# Patient Record
Sex: Female | Born: 1965 | Race: Black or African American | Hispanic: No | Marital: Single | Smoking: Never smoker
Health system: Southern US, Community
[De-identification: ages and names within clinical notes are randomized; demographics above are authoritative.]

## PROBLEM LIST (undated history)

## (undated) DIAGNOSIS — R251 Tremor, unspecified: Secondary | ICD-10-CM

## (undated) DIAGNOSIS — I10 Essential (primary) hypertension: Secondary | ICD-10-CM

## (undated) DIAGNOSIS — N200 Calculus of kidney: Secondary | ICD-10-CM

## (undated) DIAGNOSIS — Z1239 Encounter for other screening for malignant neoplasm of breast: Secondary | ICD-10-CM

## (undated) DIAGNOSIS — Z1211 Encounter for screening for malignant neoplasm of colon: Secondary | ICD-10-CM

## (undated) DIAGNOSIS — G20A1 Parkinson's disease without dyskinesia, without mention of fluctuations: Secondary | ICD-10-CM

## (undated) DIAGNOSIS — G20 Parkinson's disease: Secondary | ICD-10-CM

## (undated) HISTORY — DX: Parkinson's disease: G20

## (undated) HISTORY — DX: Encounter for screening for malignant neoplasm of colon: Z12.11

## (undated) HISTORY — DX: Parkinson's disease without dyskinesia, without mention of fluctuations: G20.A1

## (undated) HISTORY — PX: TONSILLECTOMY: SUR1361

## (undated) HISTORY — DX: Essential (primary) hypertension: I10

## (undated) HISTORY — DX: Calculus of kidney: N20.0

## (undated) HISTORY — DX: Encounter for other screening for malignant neoplasm of breast: Z12.39

## (undated) HISTORY — DX: Encounter for screening for malignant neoplasm of rectum: Z12.11

---

## 2018-02-01 ENCOUNTER — Encounter (HOSPITAL_COMMUNITY): Payer: Self-pay

## 2018-02-01 ENCOUNTER — Emergency Department (HOSPITAL_COMMUNITY): Payer: Self-pay

## 2018-02-01 ENCOUNTER — Emergency Department (HOSPITAL_COMMUNITY)
Admission: EM | Admit: 2018-02-01 | Discharge: 2018-02-02 | Disposition: A | Discharge status: Home / Self Care | Payer: Self-pay | Attending: Emergency Medicine | Admitting: Emergency Medicine

## 2018-02-01 DIAGNOSIS — N200 Calculus of kidney: Secondary | ICD-10-CM | POA: Insufficient documentation

## 2018-02-01 DIAGNOSIS — H6121 Impacted cerumen, right ear: Secondary | ICD-10-CM | POA: Insufficient documentation

## 2018-02-01 DIAGNOSIS — Z7982 Long term (current) use of aspirin: Secondary | ICD-10-CM | POA: Insufficient documentation

## 2018-02-01 DIAGNOSIS — F17210 Nicotine dependence, cigarettes, uncomplicated: Secondary | ICD-10-CM | POA: Insufficient documentation

## 2018-02-01 HISTORY — DX: Tremor, unspecified: R25.1

## 2018-02-01 LAB — URINALYSIS, ROUTINE W REFLEX MICROSCOPIC
Bilirubin Urine: NEGATIVE
Glucose, UA: NEGATIVE mg/dL
Ketones, ur: NEGATIVE mg/dL
Leukocytes, UA: NEGATIVE
Nitrite: NEGATIVE
Protein, ur: NEGATIVE mg/dL
Specific Gravity, Urine: >1.03 — ABNORMAL HIGH (ref 1.005–1.030)
pH: 5.5 (ref 5.0–8.0)

## 2018-02-01 LAB — URINALYSIS, MICROSCOPIC (REFLEX)

## 2018-02-01 LAB — CBC WITH DIFFERENTIAL/PLATELET
Basophils Absolute: 0 10*3/uL (ref 0.0–0.1)
Basophils Relative: 0 %
Eosinophils Absolute: 0.1 10*3/uL (ref 0.0–0.7)
Eosinophils Relative: 1 %
HCT: 48.1 % — ABNORMAL HIGH (ref 36.0–46.0)
Hemoglobin: 16 g/dL — ABNORMAL HIGH (ref 12.0–15.0)
Lymphocytes Relative: 25 %
Lymphs Abs: 3.6 10*3/uL (ref 0.7–4.0)
MCH: 28.4 pg (ref 26.0–34.0)
MCHC: 33.3 g/dL (ref 30.0–36.0)
MCV: 85.4 fL (ref 78.0–100.0)
Monocytes Absolute: 0.7 10*3/uL (ref 0.1–1.0)
Monocytes Relative: 5 %
Neutro Abs: 9.9 10*3/uL — ABNORMAL HIGH (ref 1.7–7.7)
Neutrophils Relative %: 69 %
Platelets: 266 10*3/uL (ref 150–400)
RBC: 5.63 MIL/uL — ABNORMAL HIGH (ref 3.87–5.11)
RDW: 13.4 % (ref 11.5–15.5)
WBC: 14.2 10*3/uL — ABNORMAL HIGH (ref 4.0–10.5)

## 2018-02-01 LAB — BASIC METABOLIC PANEL
Anion gap: 11 (ref 5–15)
BUN: 10 mg/dL (ref 6–20)
CO2: 25 mmol/L (ref 22–32)
Calcium: 9.6 mg/dL (ref 8.9–10.3)
Chloride: 102 mmol/L (ref 101–111)
Creatinine, Ser: 0.88 mg/dL (ref 0.44–1.00)
GFR calc Af Amer: >60 mL/min (ref >60)
GFR calc non Af Amer: >60 mL/min (ref >60)
Glucose, Bld: 146 mg/dL — ABNORMAL HIGH (ref 65–99)
Potassium: 3.8 mmol/L (ref 3.5–5.1)
Sodium: 138 mmol/L (ref 135–145)

## 2018-02-01 MED ORDER — ONDANSETRON 4 MG PO TBDP
4.0000 mg | ORAL_TABLET | Freq: Once | ORAL | Status: AC
  Administered 2018-02-01: 4 mg via ORAL
  Filled 2018-02-01: qty 1

## 2018-02-01 NOTE — ED Provider Notes (Signed)
Patient placed in Quick Look pathway, seen and evaluated   Chief Complaint: urinary retention, back pain  HPI:   Patient awoke this morning with low back pain favoring the right side and trouble urinating.  Patient does not have any history of chronic back issues.  She feels weak in her lower extremity's but is able to walk.  No distal numbness or tingling. Patient denies warning symptoms of back pain including: fecal incontinence, night sweats, waking from sleep with back pain, unexplained fevers or weight loss, h/o cancer, IVDU, recent trauma.     ROS:  Positive ROS: (+) Back pain, urinary retention, vomiting Negative ROS: (-) Fever, chest pain  Physical Exam:   Gen: Uncomfortable appearing  Neuro: Awake and Alert  Skin: Diaphoretic    Focused Exam: Heart RRR, nml S1,S2, no m/r/g; Lungs CTAB; Abd soft, mild suprapubic discomfort, no rebound or guarding; Ext 2+ pedal pulses bilaterally, no edema.  BP (!) 149/125 (BP Location: Left Arm)   Pulse 83   Temp 98.5 F (36.9 C) (Oral)   Resp 20   SpO2 96%   Plan: Patient with some element of urinary retention, will perform bladder scan and cath if necessary.  Given patient's amount of discomfort with right lateral back pain, will perform a renal protocol CT scan to evaluate for stone.  Initiation of care has begun. The patient has been counseled on the process, plan, and necessity for staying for the completion/evaluation, and the remainder of the medical screening examination    Renne Crigler, Cordelia Poche 02/01/18 Luberta Mutter, MD 02/02/18 [phone]

## 2018-02-01 NOTE — ED Triage Notes (Signed)
Pt presents from home with reports of not voiding x 3 hours, R flank pain and nausea/vomiting on arrival.  206 palpated BP, HR: 96, 100%-RA, CBG: 117.  Pt also reports R ear pain - has PCP appointment on Thursday.

## 2018-02-02 MED ORDER — IBUPROFEN 600 MG PO TABS: 600.0000 mg | ORAL_TABLET | Freq: Four times a day (QID) | ORAL | 0 refills | Status: DC | PRN

## 2018-02-02 MED ORDER — KETOROLAC TROMETHAMINE 30 MG/ML IJ SOLN
30.0000 mg | Freq: Once | INTRAMUSCULAR | Status: AC
  Administered 2018-02-02: 30 mg via INTRAMUSCULAR
  Filled 2018-02-02: qty 1

## 2018-02-02 MED ORDER — DOCUSATE SODIUM 50 MG/5ML PO LIQD
50.0000 mg | Freq: Once | ORAL | Status: AC
  Administered 2018-02-02: 50 mg via OTIC
  Filled 2018-02-02: qty 10

## 2018-02-02 NOTE — ED Provider Notes (Signed)
[facility] EMERGENCY DEPARTMENT Provider Note   CSN: [id number] Arrival date & time: 02/01/18  1738     History   Chief Complaint No chief complaint on file.   HPI Teresa Logan is a 52 y.o. female.  HPI  This is a 52 year old female with a history of essential tremor who presents with urinary frequency, urgency, and right low back pain.  Patient reports onset of symptoms this morning.  She reports that she has had urgency to urinate but frequently only dribbles.  She has noted dark urine and occasionally blood in her urine.  She has never had anything like this before.  She denies any abdominal pain.  She did have one episode of nonbilious, nonbloody emesis.  She reports right lower back aching.  Overall on my evaluation, the patient symptoms have resolved.  She does report a little bit of urinary pressure.  She denies any fevers.  She did not take anything for her symptoms at home.  No history of kidney stones.  Of note, patient also reports decreased hearing out of the right ear.  She states "I think something is in there."  She denies any ear pain.  Past Medical History:  Diagnosis Date  . Tremors of nervous system     There are no active problems to display for this patient.   Past Surgical History:  Procedure Laterality Date  . TONSILLECTOMY       OB History   None      Home Medications    Prior to Admission medications   Medication Sig Start Date End Date Taking? Authorizing Provider  aspirin EC 81 MG tablet Take 162 mg by mouth daily.   Yes [provider]  ibuprofen (ADVIL,MOTRIN) 600 MG tablet Take 1 tablet (600 mg total) by mouth every 6 (six) hours as needed. 02/02/18   Mysha Peeler, Mayer Masker, MD    Family History History reviewed. No pertinent family history.  Social History Social History   Tobacco Use  . Smoking status: Current Some Day Smoker  . Smokeless tobacco: Never Used  Substance Use Topics  . Alcohol use: Not on  file  . Drug use: Not on file     Allergies   Patient has no known allergies.   Review of Systems Review of Systems  Constitutional: Negative for fever.  Respiratory: Negative for shortness of breath.   Cardiovascular: Negative for chest pain.  Gastrointestinal: Positive for nausea and vomiting. Negative for abdominal pain.  Genitourinary: Positive for frequency, hematuria and urgency. Negative for dysuria.  Musculoskeletal: Positive for back pain.  All other systems reviewed and are negative.    Physical Exam Updated Vital Signs BP (!) 154/99   Pulse 88   Temp 98.4 F (36.9 C) (Oral)   Resp 16   Ht  (1.676 m)   Wt 99.8 kg (220 lb)   SpO2 100%   BMI 35.51 kg/m   Physical Exam  Constitutional: She is oriented to person, place, and time. She appears well-developed and well-nourished.  Obese, no acute distress  HENT:  Head: Normocephalic and atraumatic.  Cerumen impaction right ear, completely obscuring right TM TM clear after removal, no perforation  Eyes: Pupils are equal, round, and reactive to light.  Cardiovascular: Normal rate, regular rhythm and normal heart sounds.  Pulmonary/Chest: Effort normal and breath sounds normal. No respiratory distress. She has no wheezes.  Abdominal: Soft. Bowel sounds are normal. There is no tenderness. There is no rebound and no  guarding.  No CVA tenderness  Neurological: She is alert and oriented to person, place, and time.  Skin: Skin is warm and dry.  Psychiatric: She has a normal mood and affect.  Nursing note and vitals reviewed.    ED Treatments / Results  Labs (all labs ordered are listed, but only abnormal results are displayed) Labs Reviewed  CBC WITH DIFFERENTIAL/PLATELET - Abnormal; Notable for the following components:      Result Value   WBC 14.2 (*)    RBC 5.63 (*)    Hemoglobin 16.0 (*)    HCT 48.1 (*)    Neutro Abs 9.9 (*)    All other components within normal limits  BASIC METABOLIC PANEL -  Abnormal; Notable for the following components:   Glucose, Bld 146 (*)    All other components within normal limits  URINALYSIS, ROUTINE W REFLEX MICROSCOPIC - Abnormal; Notable for the following components:   Specific Gravity, Urine >1.030 (*)    Hgb urine dipstick LARGE (*)    All other components within normal limits  URINALYSIS, MICROSCOPIC (REFLEX) - Abnormal; Notable for the following components:   Bacteria, UA RARE (*)    All other components within normal limits    EKG None  Radiology Ct Renal Stone Study  Result Date: 02/01/2018 CLINICAL DATA:  Right flank pain.  Nausea and vomiting. EXAM: CT ABDOMEN AND PELVIS WITHOUT CONTRAST TECHNIQUE: Multidetector CT imaging of the abdomen and pelvis was performed following the standard protocol without IV contrast. COMPARISON:  None. FINDINGS: Lower chest: No acute abnormality. Hepatobiliary: No focal liver abnormality is seen. No gallstones, gallbladder wall thickening, or biliary dilatation. Pancreas: Unremarkable. No pancreatic ductal dilatation or surrounding inflammatory changes. Spleen: Normal in size without focal abnormality. Adrenals/Urinary Tract: There is a punctate stone in the lower right kidney. There is a 3 or 4 mm stone in the lower left kidney. No hydronephrosis or perinephric stranding. There is a tiny stone in the posterior bladder near midline. The right ureter is mildly more prominent than the left. The left ureter is normal in caliber with no stones. The bladder is otherwise normal. Stomach/Bowel: Stomach and small bowel are normal. The appendix is mildly prominent distally without adjacent stranding. The appendix measures up to 8.6 mm. The colon is unremarkable. Vascular/Lymphatic: The abdominal aorta is normal in caliber. A few shotty nodes in the right lower quadrant mesentery may be reactive. No adenopathy. Reproductive: Uterus and bilateral adnexa are unremarkable. Other: No abdominal wall hernia or abnormality. No  abdominopelvic ascites. Musculoskeletal: No acute or significant osseous findings. IMPRESSION: 1. There is a tiny stone in the posterior bladder. The right ureter is slightly larger than the left. Given symptoms, I suspect a recently passed stone explaining the patient's symptoms. 2. Distal appendix is prominent in size measuring up to 8.6 mm. However, there is no adjacent stranding. Given the apparent recently passed stone to explain the patient's symptoms and the lack of para appendiceal stranding, suspicion for appendicitis is low. Recommend clinical correlation. 3. Punctate stone in the lower right kidney. Three or 4 mm stone in the lower left kidney. Electronically Signed   By: Gerome Sam III M.D   On: 02/01/2018 19:52    Procedures .Ear Cerumen Removal Date/Time: 02/02/2018 3:42 AM Performed by: Shon Baton, MD Authorized by: Shon Baton, MD   Consent:    Consent obtained:  Verbal   Consent given by:  Patient   Risks discussed:  TM perforation and pain  Alternatives discussed:  No treatment Procedure details:    Location:  R ear   Procedure type: irrigation   Post-procedure details:    Inspection:  TM intact   Hearing quality:  Improved   Patient tolerance of procedure:  Tolerated well, no immediate complications   (including critical care time)  Medications Ordered in ED Medications  ondansetron (ZOFRAN-ODT) disintegrating tablet 4 mg (4 mg Oral Given 02/01/18 1823)  ketorolac (TORADOL) 30 MG/ML injection 30 mg (30 mg Intramuscular Given 02/02/18 0243)  docusate (COLACE) 50 MG/5ML liquid 50 mg (50 mg Right EAR Given 02/02/18 0258)     Initial Impression / Assessment and Plan / ED Course  I have reviewed the triage vital signs and the nursing notes.  Pertinent labs & imaging results that were available during my care of the patient were reviewed by me and considered in my medical decision making (see chart for details).     Patient presents with urinary  symptoms and low back pain.  Also reports decreased hearing in the right ear.  She is overall nontoxic-appearing.  Symptoms have mostly improved.  She was evaluated by the provider in triage.  Work-up reviewed.  CT scan shows likely recently passed stone into the bladder.  Of note, she also has enlarged appendix.  There is no adjacent stranding.  Clinically I have low suspicion for appendicitis as patient is nontender on exam and symptoms have resolved.  Much more consistent with kidney stones.  Cerumen impaction was removed and TM is clear.  Urology follow-up provided.  Given that symptoms have resolved, will treat with ibuprofen.  After history, exam, and medical workup I feel the patient has been appropriately medically screened and is safe for discharge home. Pertinent diagnoses were discussed with the patient. Patient was given return precautions.   Final Clinical Impressions(s) / ED Diagnoses   Final diagnoses:  Kidney stone  Impacted cerumen of right ear    ED Discharge Orders        Ordered    ibuprofen (ADVIL,MOTRIN) 600 MG tablet  Every 6 hours PRN     02/02/18 0337       Shon Baton, MD 02/02/18 [phone]

## 2018-02-02 NOTE — Discharge Instructions (Addendum)
You were seen today for urinary symptoms and back pain.  It appears that you recently casted kidney stone and your bladder.  You should feel progressive improvement.  Take ibuprofen as needed for pain.  You also had impaction of earwax in the right ear.  Do not use Q-tips or anything else to clean out your ears.

## 2018-02-02 NOTE — ED Notes (Signed)
ED Provider at bedside. 

## 2019-12-08 ENCOUNTER — Other Ambulatory Visit: Payer: Self-pay

## 2019-12-08 ENCOUNTER — Ambulatory Visit: Payer: Medicaid Other | Admitting: Internal Medicine

## 2019-12-08 ENCOUNTER — Encounter: Payer: Self-pay | Admitting: Internal Medicine

## 2019-12-08 VITALS — BP 173/117 | HR 99 | Temp 99.0°F | Ht 66.0 in | Wt 218.6 lb

## 2019-12-08 DIAGNOSIS — Z79899 Other long term (current) drug therapy: Secondary | ICD-10-CM

## 2019-12-08 DIAGNOSIS — Z1211 Encounter for screening for malignant neoplasm of colon: Secondary | ICD-10-CM

## 2019-12-08 DIAGNOSIS — G20 Parkinson's disease: Secondary | ICD-10-CM | POA: Diagnosis not present

## 2019-12-08 DIAGNOSIS — G20A1 Parkinson's disease without dyskinesia, without mention of fluctuations: Secondary | ICD-10-CM | POA: Insufficient documentation

## 2019-12-08 DIAGNOSIS — Z1212 Encounter for screening for malignant neoplasm of rectum: Secondary | ICD-10-CM

## 2019-12-08 DIAGNOSIS — Z13220 Encounter for screening for lipoid disorders: Secondary | ICD-10-CM

## 2019-12-08 DIAGNOSIS — Z7722 Contact with and (suspected) exposure to environmental tobacco smoke (acute) (chronic): Secondary | ICD-10-CM

## 2019-12-08 DIAGNOSIS — Z1239 Encounter for other screening for malignant neoplasm of breast: Secondary | ICD-10-CM | POA: Insufficient documentation

## 2019-12-08 DIAGNOSIS — I10 Essential (primary) hypertension: Secondary | ICD-10-CM

## 2019-12-08 DIAGNOSIS — Z1231 Encounter for screening mammogram for malignant neoplasm of breast: Secondary | ICD-10-CM

## 2019-12-08 DIAGNOSIS — Z131 Encounter for screening for diabetes mellitus: Secondary | ICD-10-CM | POA: Diagnosis not present

## 2019-12-08 LAB — POCT GLYCOSYLATED HEMOGLOBIN (HGB A1C): Hemoglobin A1C: 5.6 % (ref 4.0–5.6)

## 2019-12-08 LAB — GLUCOSE, CAPILLARY: Glucose-Capillary: 119 mg/dL — ABNORMAL HIGH (ref 70–99)

## 2019-12-08 MED ORDER — HYDROCHLOROTHIAZIDE 12.5 MG PO CAPS: 12.5000 mg | ORAL_CAPSULE | Freq: Every day | ORAL | 1 refills | Status: DC

## 2019-12-08 NOTE — Assessment & Plan Note (Signed)
Breast cancer screening Patient reports last mammogram in 2011.  No family history of breast cancer or abnormal mammograms. Plan: Referral sent for mammogram

## 2019-12-08 NOTE — Assessment & Plan Note (Signed)
Colorectal Screening: Patient would not like to have a colonoscopy.   Plan: fecal immunochemical test

## 2019-12-08 NOTE — Assessment & Plan Note (Signed)
Parkinson's Disease: Patient reports she was followed by Dr. Baird Lyons in [facility].  She had a DaTscan in 2013, but did not follow-up with the neurologist. Approximately 5 years later she obtained her records and the report read diagnostic for Parkinson Disease. She made an appointment with Dr.Landress who confirmed this diagnosis. She has not started any treatment.  Her tremors have been in her right arm and right leg mostly.  She has noticed depression and decline in energy over time.  Denies any difficulty swallowing, constipation, and problems with urination. Sleep is effected with tremor, but patient reports she is managing.  Plan: -Referral to neurology - obtain records , release form signed  Dr.Landress Address: [phone] [place], [place] [address] Hours:  Open ? Closes 5PM Phone: [phone]

## 2019-12-08 NOTE — Progress Notes (Signed)
   CC: Here to establish care, need a referral to neurologist for Parkinson's disease, and evaluation of high blood pressure  HPI:Ms.Lema Heinkel is a 54 y.o. female who presents for evaluation of high blood pressure and to establish care. Please see individual problem based A/P for details.    Past Medical History:  Diagnosis Date  . Tremors of nervous system    Past Surgical History:  Procedure Laterality Date  . TONSILLECTOMY     Family History  Problem Relation Age of Onset  . Diabetes type II Mother   . Alzheimer's disease Mother   . Diabetes type II Father   . Kidney disease Father    Social History   Tobacco Use  . Smoking status: Passive Smoke Exposure - Never Smoker  . Smokeless tobacco: Never Used  Substance Use Topics  . Alcohol use: Yes    Alcohol/week: 2.0 standard drinks    Types: 1 Glasses of wine, 1 Cans of beer per week    Comment: 1 drink per day, either wine or beer  . Drug use: Not on file    Review of Systems:  ROS negative except as per HPI.  Physical Exam: Vitals:   12/08/19 0926  SpO2: 99%  Weight: 111 lb 11.2 oz (50.7 kg)    General: Alert, tremor, stooped posture HE: Normocephalic, atraumatic , EOMI, Conjunctivae normal ENT: No congestion, no rhinorrhea moist, no exudate or erythema  Cardiovascular: Normal rate, regular rhythm.  No murmurs, rubs, or gallops Pulmonary : Effort normal, breath sounds normal. No wheezes, rales, or rhonchi Abdominal: soft, nontender,  bowel sounds present Musculoskeletal: no swelling , or deformity  Neuro: Resting tremor in right arm and right leg. Tremor is better with movement.  Skin: Warm, dry  Psychiatric/Behavioral:  normal mood, normal behavior     Assessment & Plan:   See Encounters Tab for problem based charting.  Patient discussed with Dr. Heide Spark

## 2019-12-08 NOTE — Assessment & Plan Note (Signed)
Hypertension: Patient's BP today is 173/117 with a goal of <140/80. The patient is currently untreated.  Reports her blood pressure usually runs 145/85-90 at home.  She is a former Engineer, civil (consulting). Denies ever being on treatment,but reports her blood pressure has been slightly elevated in the past.  Plan: -Start HCTZ  12.5 mg daily, follow up in 4 weeks - BMP

## 2019-12-08 NOTE — Patient Instructions (Addendum)
Thank you for trusting me with your care. To recap, today we discussed the following:   Parkinson's disease I put a referral into neurology.  We will obtain records from your previous neurologist.  Hypertension Your blood pressure was 173/117 today.  We will start a medication called hydrochlorothiazide.  I would like you to follow-up in 4 weeks.  Lab work: We will check a lipid panel, hemoglobin A1c, complete blood count, and a basic metabolic panel today .  I have put a referral in for your mammogram. Also you will be given instructions for fecal occult blood test by the lab.  This is a screening for colorectal cancer.  We can schedule a Pap smear at your next visit.  My best,  Thurmon Fair, MD

## 2019-12-09 LAB — CBC
Hematocrit: 50.4 % — ABNORMAL HIGH (ref 34.0–46.6)
Hemoglobin: 16.8 g/dL — ABNORMAL HIGH (ref 11.1–15.9)
MCH: 28.8 pg (ref 26.6–33.0)
MCHC: 33.3 g/dL (ref 31.5–35.7)
MCV: 86 fL (ref 79–97)
Platelets: 328 10*3/uL (ref 150–450)
RBC: 5.84 x10E6/uL — ABNORMAL HIGH (ref 3.77–5.28)
RDW: 13 % (ref 11.7–15.4)
WBC: 9.7 10*3/uL (ref 3.4–10.8)

## 2019-12-09 LAB — BMP8+ANION GAP
Anion Gap: 18 mmol/L (ref 10.0–18.0)
BUN/Creatinine Ratio: 14 (ref 9–23)
BUN: 15 mg/dL (ref 6–24)
CO2: 23 mmol/L (ref 20–29)
Calcium: 10.2 mg/dL (ref 8.7–10.2)
Chloride: 101 mmol/L (ref 96–106)
Creatinine, Ser: 1.06 mg/dL — ABNORMAL HIGH (ref 0.57–1.00)
GFR calc Af Amer: 69 mL/min/{1.73_m2} (ref >59)
GFR calc non Af Amer: 60 mL/min/{1.73_m2} (ref >59)
Glucose: 115 mg/dL — ABNORMAL HIGH (ref 65–99)
Potassium: 4.5 mmol/L (ref 3.5–5.2)
Sodium: 142 mmol/L (ref 134–144)

## 2019-12-09 LAB — LIPID PANEL
Chol/HDL Ratio: 5.5 ratio — ABNORMAL HIGH (ref 0.0–4.4)
Cholesterol, Total: 247 mg/dL — ABNORMAL HIGH (ref 100–199)
HDL: 45 mg/dL (ref >39)
LDL Chol Calc (NIH): 166 mg/dL — ABNORMAL HIGH (ref 0–99)
Triglycerides: 195 mg/dL — ABNORMAL HIGH (ref 0–149)
VLDL Cholesterol Cal: 36 mg/dL (ref 5–40)

## 2019-12-12 ENCOUNTER — Encounter: Payer: Self-pay | Admitting: Neurology

## 2019-12-13 NOTE — Progress Notes (Signed)
Internal Medicine Clinic Attending ° °Case discussed with Dr. Steen  at the time of the visit.  We reviewed the resident’s history and exam and pertinent patient test results.  I agree with the assessment, diagnosis, and plan of care documented in the resident’s note.  °

## 2019-12-20 ENCOUNTER — Ambulatory Visit: Payer: Medicaid Other | Admitting: Neurology

## 2020-01-03 ENCOUNTER — Ambulatory Visit
Admission: RE | Admit: 2020-01-03 | Discharge: 2020-01-03 | Disposition: A | Discharge status: Home / Self Care | Payer: Medicaid Other | Source: Ambulatory Visit | Attending: Internal Medicine | Admitting: Internal Medicine

## 2020-01-03 ENCOUNTER — Other Ambulatory Visit: Payer: Self-pay

## 2020-01-03 DIAGNOSIS — Z1231 Encounter for screening mammogram for malignant neoplasm of breast: Secondary | ICD-10-CM | POA: Diagnosis not present

## 2020-01-10 ENCOUNTER — Ambulatory Visit: Payer: Medicaid Other | Admitting: Internal Medicine

## 2020-01-10 ENCOUNTER — Other Ambulatory Visit: Payer: Self-pay

## 2020-01-10 ENCOUNTER — Encounter: Payer: Self-pay | Admitting: Internal Medicine

## 2020-01-10 VITALS — BP 146/109 | HR 98 | Temp 98.4°F | Wt 217.9 lb

## 2020-01-10 DIAGNOSIS — G20 Parkinson's disease: Secondary | ICD-10-CM

## 2020-01-10 DIAGNOSIS — I10 Essential (primary) hypertension: Secondary | ICD-10-CM

## 2020-01-10 DIAGNOSIS — E785 Hyperlipidemia, unspecified: Secondary | ICD-10-CM

## 2020-01-10 DIAGNOSIS — Z79899 Other long term (current) drug therapy: Secondary | ICD-10-CM

## 2020-01-10 MED ORDER — AMLODIPINE BESYLATE 5 MG PO TABS: 5.0000 mg | ORAL_TABLET | Freq: Every day | ORAL | 1 refills | Status: DC

## 2020-01-10 MED ORDER — HYDROCHLOROTHIAZIDE 25 MG PO TABS: 25.0000 mg | ORAL_TABLET | Freq: Every day | ORAL | 1 refills | Status: DC

## 2020-01-10 NOTE — Assessment & Plan Note (Signed)
  HLD The 10-year ASCVD risk score Denman George DC Jr., et al., 2013) is: 10.8%   Values used to calculate the score:     Age: 54 years     Sex: Female     Is Non-Hispanic African American: Yes     Diabetic: No     Tobacco smoker: No     Systolic Blood Pressure: 146 mmHg     Is BP treated: Yes     HDL Cholesterol: 45 mg/dL     Total Cholesterol: 247 mg/dL  Plan: - start patient on moderate intensity Statin, Crestor 10 mg - Follow up in 6 weeks to check LDL

## 2020-01-10 NOTE — Progress Notes (Signed)
   CC: high blood pressure and high cholesterol   HPI:Ms.Brock Mokry is a 54 y.o. female who presents for evaluation of hypertension and hyperlipidemia. Please see individual problem based A/P for details.  Depression, PHQ-9: Based on the patients    Office Visit from 01/10/2020 in [facility]  PHQ-9 Total Score  9      Past Medical History:  Diagnosis Date  . Kidney stones   . Parkinson's disease (HCC)   . Tremors of nervous system    Review of Systems:  Performed and all others negative.   Physical Exam: Vitals:   01/10/20 1348 01/10/20 1430  BP: (!) 161/107 (!) 146/109  Pulse: 98 98  Temp: 98.4 F (36.9 C)   TempSrc: Oral   SpO2: 99%   Weight: 217 lb 14.4 oz (98.8 kg)     General: alert, tremor in right arm and leg HE: Normocephalic, atraumatic , EOMI, Conjunctivae normal ENT: No congestion, no rhinorrhea, no exudate or erythema  Cardiovascular: Normal rate, regular rhythm.  No murmurs, rubs, or gallops Pulmonary : Effort normal, breath sounds normal. No wheezes, rales, or rhonchi Abdominal: soft, nontender,  bowel sounds present  Assessment & Plan:   See Encounters Tab for problem based charting.  Patient discussed with Dr. Sandre Kitty

## 2020-01-10 NOTE — Patient Instructions (Addendum)
Thank you for trusting me with your care. To recap, today we discussed the following:   High blood pressure I increased the dose of hydrochlorothiazide to 25 mg/day.  -We will start a new medication called amlodipine.  Start at 5 mg and take for 1 week.  If your blood pressure continues to be greater than 140 and you are having no side effects then increase to 10 mg daily.   High Cholesterol The 10-year ASCVD risk score Denman George DC Jr., et al., 2013) is: 10.8%   Values used to calculate the score:     Age: 54 years     Sex: Female     Is Non-Hispanic African American: Yes     Diabetic: No     Tobacco smoker: No     Systolic Blood Pressure: 146 mmHg     Is BP treated: Yes     HDL Cholesterol: 45 mg/dL     Total Cholesterol: 247 mg/dL The recommendation is to start a moderate to high intensity statin. We will start you on Crestor 10 mg today.  Please go by the lab for a blood draw and I will call you tomorrow with results.  My best,  Dr.Geovanna Simko

## 2020-01-10 NOTE — Progress Notes (Signed)
CMA unsuccessfully irrigated right ear-MD aware. Pt advised to use otc ear wax softener prior to next visit.Kingsley Spittle Cassady4/14/20212:54 PM

## 2020-01-10 NOTE — Assessment & Plan Note (Signed)
Patient has appointment scheduled with Neurology for 01/15/2020

## 2020-01-10 NOTE — Assessment & Plan Note (Signed)
HTN: Hypertension: Patient's BP today is 161/107 with a goal of <140/80. The patient endorses adherence to her medication regimen. She denied, chest pain, headache, visual changes, lightheadedness, dizziness on standing, swelling in the feet or ankles.  Plan: Increase HCTZ from 12.5 to 25 mg daily Start Amlodopine 5 mg , increase to 10 mg daily if SBP > 140 in one week. - Follow up in 6 weeks.

## 2020-01-11 LAB — CMP14 + ANION GAP
ALT: 16 IU/L (ref 0–32)
AST: 19 IU/L (ref 0–40)
Albumin/Globulin Ratio: 1.2 (ref 1.2–2.2)
Albumin: 4.4 g/dL (ref 3.8–4.9)
Alkaline Phosphatase: 107 IU/L (ref 39–117)
Anion Gap: 18 mmol/L (ref 10.0–18.0)
BUN/Creatinine Ratio: 14 (ref 9–23)
BUN: 14 mg/dL (ref 6–24)
Bilirubin Total: 0.3 mg/dL (ref 0.0–1.2)
CO2: 21 mmol/L (ref 20–29)
Calcium: 10.1 mg/dL (ref 8.7–10.2)
Chloride: 101 mmol/L (ref 96–106)
Creatinine, Ser: 0.97 mg/dL (ref 0.57–1.00)
GFR calc Af Amer: 77 mL/min/{1.73_m2} (ref >59)
GFR calc non Af Amer: 66 mL/min/{1.73_m2} (ref >59)
Globulin, Total: 3.6 g/dL (ref 1.5–4.5)
Glucose: 121 mg/dL — ABNORMAL HIGH (ref 65–99)
Potassium: 4.3 mmol/L (ref 3.5–5.2)
Sodium: 140 mmol/L (ref 134–144)
Total Protein: 8 g/dL (ref 6.0–8.5)

## 2020-01-12 NOTE — Progress Notes (Signed)
Internal Medicine Clinic Attending  Case discussed with Dr. Steen at the time of the visit.  We reviewed the resident's history and exam and pertinent patient test results.  I agree with the assessment, diagnosis, and plan of care documented in the resident's note.  Mychal Durio, M.D., Ph.D.  

## 2020-01-12 NOTE — Progress Notes (Signed)
Assessment/Plan:    1.  Young onset Parkinson's disease.  -Patient was about 54 years old at first symptom.  -Patient is significantly rigid at this point in time, but really has not been treated with the exception of a few months in 2019.  Talked about various medication options.  I suspect that she will need both levodopa as well as a dopamine agonist.  We talked about this.  Ultimately, we decided to start levodopa today.  We will start and slowly work to 1 tablet 3 times per day.  I suspect she will need more than this, but we will start here.  We discussed extensively risk, benefits, and side effects, including but not limited to dyskinesia.  She expressed understanding.  -She and I talked about the importance of regular daily schedule.  Right now, she gets up for the day around 5 PM and stays up all night and then goes to sleep at about 8 AM for the day.  She does this because she used to work night shift during nursing work and really just never changed back.  I think it will be important for her to change onto a day schedule as she gets older so that we do not get confusion/hallucinations/day night reversal.  -We discussed that it used to be thought that levodopa would increase risk of melanoma but now it is believed that Parkinsons itself likely increases risk of melanoma. she is to get regular skin checks.  -I will refer the patient [facility], for PT/OT and ST.  We talked about the importance of safe, cardiovascular exercise in Parkinson's disease.  -We discussed community resources in the area including patient support groups and community exercise programs for PD and pt education was provided to the patient.  -She met with my social worker today and we discussed her case together.  2.  Follow up is anticipated in the next 4-6 months, sooner should new neurologic issues arise.   Subjective:   Teresa Logan was seen today in the  movement disorders clinic for neurologic consultation at the request of Aldine Contes, MD.  The consultation is for the evaluation of Parkinson's disease.  Patient was apparently previously seen in [place].  Patient hand carries records in with her today, which were reviewed in detail.  She saw a neurologist, Dr. Shaune Pollack, in 2013.  Notes indicate at that point in time, it was felt that she did not have Parkinson's disease, but that she had essential tremor.  Patient was worried about Parkinson's disease, however, so she had a DaTscan done on December 17, 2011.  No images are made available to me.  It was reported to show decreased uptake of the radiotracer.  It stated "there is asymmetric uptake within the caudate and putamen with better affinity of the radiotracer within the caudate, right greater than left.  There is markedly reduced activity in the putamen with only the left side showing radiotracer uptake."  The patient was lost to follow-up.  States that she did not have her DaTscan results until she got a copy of her medical records years later.  She went back to Dr. Shaune Pollack in August, 2019.  She was diagnosed with Parkinson's disease.  I have a note from May 13, 2018 that stated that the patient was on carbidopa/levodopa 25/100, 1 tablet 3 times per day.  Notes from September, 2019 indicated that the patient stated that it helped and that she "generally feels better."  She took it for about 3 months total.  Pt states that it gave her energy but she wasn't convinced it helped the tremor that much.  Specific Symptoms:  Tremor: Yes.  , 2013  - started R hand and noted R foot clawing.  Noted R foot tremor not long thereafter.  In the last year, L hand tremor intermittently Family hx of similar:  No. Voice: no change Sleep: trouble getting and staying asleep  Vivid Dreams:  No.  Acting out dreams:  Yes.  , some screaming/crying Wet Pillows: Yes.   some drooling at night only Postural symptoms:   Yes.    Falls?  No. Bradykinesia symptoms: shuffling gait, slow movements and difficulty getting out of a chair Loss of smell:  Yes.   Loss of taste:  No. Urinary Incontinence:  Yes.  , some but mostly urgency Difficulty Swallowing:  No. Handwriting, micrographia: Yes.   Trouble with ADL's:  Yes.  , some things  Trouble buttoning clothing: Yes.   Depression:  Yes.   and anxious Memory changes:  No. Hallucinations:  Yes.  , rarely (she hesitates when thinking/answering the question)  visual distortions: Yes.   N/V:  No. Lightheaded:  No.  Syncope: No. Diplopia:  No. Dyskinesia:  No. Prior exposure to reglan/antipsychotics: No.   PREVIOUS MEDICATIONS: Sinemet  ALLERGIES:  No Known Allergies  CURRENT MEDICATIONS:  Current Outpatient Medications  Medication Instructions  . amLODipine (NORVASC) 5 mg, Oral, Daily  . aspirin EC 162 mg, Oral, Daily  . hydrochlorothiazide (HYDRODIURIL) 25 mg, Oral, Daily  . ibuprofen (ADVIL) 600 mg, Oral, Every 6 hours PRN    Objective:   VITALS:   Vitals:   01/15/20 1015  BP: (!) 153/100  Pulse: 93  SpO2: 98%  Weight: 219 lb (99.3 kg)  Height: _0  (1.676 m)    GEN:  The patient appears stated age and is in NAD. HEENT:  Normocephalic, atraumatic.  The mucous membranes are moist. The superficial temporal arteries are without ropiness or tenderness. CV:  RRR Lungs:  CTAB Neck/HEME:  There are no carotid bruits bilaterally.  Neurological examination:  Orientation: The patient is alert and oriented x3.  Cranial nerves: There is good facial symmetry.  There is facial hypomimia.  Extraocular muscles are intact. The visual fields are full to confrontational testing. The speech is fluent and clear. Soft palate rises symmetrically and there is no tongue deviation. Hearing is intact to conversational tone. Sensation: Sensation is intact to light and pinprick throughout (facial, trunk, extremities). There is no extinction with double  simultaneous stimulation. There is no sensory dermatomal level identified. Motor: Strength is 5/5 in the bilateral upper and lower extremities.   Shoulder shrug is equal and symmetric.  There is no pronator drift.  Movement examination: Tone: There is moderate to moderately severe increased tone in the right upper extremity.  There is moderate increased tone in the right lower extremity.  There is mild to moderate increased tone in the left upper extremity. Abnormal movements: There is right upper and right lower extremity tremor that is greater than left upper extremity tremor. Coordination:  There is decremation with RAM's, with any form of RAMS, including alternating supination and pronation of the forearm, hand opening and closing, finger taps, heel taps and toe taps, R >L Gait and Station: The patient is able to arise without the use of her hands.  She has decreased arm swing.  I have reviewed and interpreted the following labs independently  Chemistry      Component Value Date/Time   NA 140 01/10/2020 1450   K 4.3 01/10/2020 1450   CL 101 01/10/2020 1450   CO2 21 01/10/2020 1450   BUN 14 01/10/2020 1450   CREATININE 0.97 01/10/2020 1450      Component Value Date/Time   CALCIUM 10.1 01/10/2020 1450   ALKPHOS 107 01/10/2020 1450   AST 19 01/10/2020 1450   ALT 16 01/10/2020 1450   BILITOT 0.3 01/10/2020 1450     No results found for: TSH Lab Results  Component Value Date   HGBA1C 5.6 12/08/2019     Total time spent on today's visit was 75 minutes, including both face-to-face time and nonface-to-face time.  Time included that spent on review of records (prior notes available to me/labs/imaging if pertinent), discussing treatment and goals, answering patient's questions and coordinating care.  Cc:  Madalyn Rob, MD

## 2020-01-15 ENCOUNTER — Encounter: Payer: Self-pay | Admitting: Neurology

## 2020-01-15 ENCOUNTER — Ambulatory Visit (INDEPENDENT_AMBULATORY_CARE_PROVIDER_SITE_OTHER): Payer: Medicaid Other | Admitting: Clinical

## 2020-01-15 ENCOUNTER — Ambulatory Visit (INDEPENDENT_AMBULATORY_CARE_PROVIDER_SITE_OTHER): Payer: Medicaid Other | Admitting: Neurology

## 2020-01-15 ENCOUNTER — Other Ambulatory Visit: Payer: Self-pay

## 2020-01-15 VITALS — BP 153/100 | HR 93 | Ht 66.0 in | Wt 219.0 lb

## 2020-01-15 DIAGNOSIS — Z719 Counseling, unspecified: Secondary | ICD-10-CM

## 2020-01-15 DIAGNOSIS — G20 Parkinson's disease: Secondary | ICD-10-CM

## 2020-01-15 MED ORDER — CARBIDOPA-LEVODOPA 25-100 MG PO TABS: 1.0000 | ORAL_TABLET | Freq: Three times a day (TID) | ORAL | 1 refills | Status: DC

## 2020-01-15 NOTE — Patient Instructions (Signed)
Start Carbidopa Levodopa as follows: Take 1/2 tablet three times daily, at least 30 minutes before meals (approximately 7am/11am/4pm), for one week Then take 1/2 tablet in the morning, 1/2 tablet in the afternoon, 1 tablet in the evening, at least 30 minutes before meals, for one week Then take 1/2 tablet in the morning, 1 tablet in the afternoon, 1 tablet in the evening, at least 30 minutes before meals, for one week Then take 1 tablet three times daily at 7am/11am/4pm, at least 30 minutes before meals   As a reminder, carbidopa/levodopa can be taken at the same time as a carbohydrate, but we like to have you take your pill either 30 minutes before a protein source or 1 hour after as protein can interfere with carbidopa/levodopa absorption.  

## 2020-01-15 NOTE — Progress Notes (Signed)
Referring Provider: Kerin Salen, DO Date of Referral: 01/15/2020 Primary Reason for Referral: New PD dx Location of Visit: Individual, office visit  Suicide/Homicide Risk: Pt denies risk  Subjective Notes: PD Sx started 2013 Started carbidopa-levodopa in past but not taken for yr+, will restart today  No current exercise routine-interested in the thrive group 3 grandkids Goal to feel better so to travel  Psychosocial Assessment Patient presents today for psychoeducation with LCSW following with new diagnosis of Parkinson's Disease by referring provider Dr. Lurena Joiner Tat. LCSW provided patient education re motor and nonmotor Parkinson's symptoms including apathy, depression, incontinence/constipation, sleep behavior disorders, communication and cognitive impairment and the value of multidisciplinary care to manage symptoms. LCSW provided supportive counseling as patient considers mobility and social limitations around PD and plan for communicating diagnosis with friends/family.  LCSW provided pt with information about our support and educational groups for patients with Parkinson's as well as their care partners, also discussed the importance of forced intense exercise in the management of PD and provided information about current exercise opportunities. Also discussed availability of individual counseling sessions to address the adjustment of living with chronic disease of Parkinson's, invited patient to schedule with LCSW Link Snuffer with [facility] as desired. Also provided pt with counseling information for local women's Parkinson's group. Pt responded receptively to patient education today. Pt strengths include good motivation for treatment adherence, increased exercise,and social support. Pt would benefit from targeting exercise to include more forced intense exercise and engagement with Parkinson's community resource to aid in adjustment to diagnosis.    Brief Interventions provided today  in session 1. psychoeducation, patient education 2. Supportive counseling   Plan 1. Read "Newly Diagnosed"/ FAQs (Parkinson[employer]), Information provided to pt. Contact LCSW with any questions related to Parkinson's & behavioral health  2. Goal for exercise- consider Cycling, RSB or other forced intense exercise 3. Consider individual counseling and social education groups for increased support  Behavioral Health treatment recommendations communicated to referring provider and pt states agreement with plan. LCSW will remain available for future consultation.

## 2020-01-16 ENCOUNTER — Other Ambulatory Visit: Payer: Self-pay | Admitting: *Deleted

## 2020-01-16 MED ORDER — ROSUVASTATIN CALCIUM 10 MG PO TABS: 10.0000 mg | ORAL_TABLET | Freq: Every day | ORAL | 0 refills | Status: DC

## 2020-01-23 ENCOUNTER — Telehealth: Payer: Self-pay | Admitting: *Deleted

## 2020-01-23 NOTE — Telephone Encounter (Signed)
Pt called and stated dr did not send her script for crestor in, why? Informed that it was sent had she called pharmacy, stated no, ask to call pharmacy and then if there are problems to call triage back. She is agreeable

## 2020-02-28 ENCOUNTER — Telehealth: Payer: Self-pay | Admitting: *Deleted

## 2020-02-28 ENCOUNTER — Encounter: Payer: Self-pay | Admitting: Internal Medicine

## 2020-02-28 ENCOUNTER — Ambulatory Visit: Payer: Medicaid Other

## 2020-02-28 NOTE — Telephone Encounter (Signed)
Call to patient about missed appointment today.  Message was left to call the [facility] to reschedule appointment if needed.  Angelina Ok, RN 3:10 PM.

## 2020-03-06 ENCOUNTER — Ambulatory Visit: Payer: Medicaid Other | Admitting: Physical Therapy

## 2020-03-06 ENCOUNTER — Other Ambulatory Visit: Payer: Self-pay

## 2020-03-06 ENCOUNTER — Ambulatory Visit: Payer: Medicaid Other

## 2020-03-06 ENCOUNTER — Telehealth: Payer: Self-pay | Admitting: Internal Medicine

## 2020-03-06 VITALS — BP 140/104 | HR 95

## 2020-03-06 NOTE — Therapy (Signed)
[facility] [address] [place], [place], [address] Phone: [phone]   Fax:  [phone]  Patient Details  Name: Teresa Logan MRN: [id number] Date of Birth: 08-Feb-1966 Referring Provider:  Vladimir Faster, DO  Encounter Date: 03/06/2020   Arrived - No Charge  Vitals:   03/06/20 0803 03/06/20 0822  BP: (!) 138/106 (!) 140/104  Pulse: 95    Pt arrived for PT eval today. Assessed pt's BP with diastolic being elevated (see vitals). Pt reporting no signs/symptoms of a CVA and reports that she took her BP medication this morning. Pt currently scheduled to be seen by PCP next week in regards to elevated BP. PT called pt's PCP and got pt scheduled for an earlier appointment this afternoon at 3:45 PM, pt verbalized understanding. Educated pt on signs and symptoms of a CVA and to go home and monitor BP and to go to the ER if pt demonstrates any signs/symptoms of a CVA and if BP remains elevated. Pt called her ride to be picked up for a ride home. Pt remaining asymptomatic throughout and verbalized instructions. Discussed with pt re-scheduling PT eval in a couple of weeks until BP is under control.    Drake Leach, PT, DPT  03/06/2020, 12:49 PM  [facility] [address] [place], [place], [address] Phone: [phone]   Fax:  [phone]

## 2020-03-06 NOTE — Telephone Encounter (Signed)
Returned call to patient. No answer. Left message on VM requesting return call. L. Gauri Galvao, RN, BSN     

## 2020-03-06 NOTE — Telephone Encounter (Signed)
Patient called back. States she went to PT this AM but they were unable to do an evaluation as her BP was 138/104. She has been taking Norvasc 5 mg daily and HCTZ 25 mg daily and had taken both at 7 AM today. States BP at home when lying down is 130s/70s. States PCP had told her to take 10 mg norvasc when BP is elevated. She has [facility] appt already scheduled for 03/13/2020 and will take 10 mg norvasc daily until that appt. Kinnie Feil, BSN, RN-BC

## 2020-03-06 NOTE — Telephone Encounter (Signed)
Pt requesting a call back from nurse regarding blood pressure [phone]

## 2020-03-06 NOTE — Patient Instructions (Signed)
Warning Signs of a Stroke  A stroke is a medical emergency and should be treated right away--every second counts. A stroke is caused by a decrease or block in blood flow to the brain. When this occurs, certain areas of the brain do not get enough oxygen, and brain cells begin to die. A stroke can lead to brain damage and can sometimes be life-threatening. However, if someone having a stroke gets medical treatment right away, he or she has better chances of surviving and recovering from the stroke. Being able to recognize the symptoms of a stroke is very important. Types of strokes There are two main types of strokes:  Ischemic strokes. This is the most common type of stroke. These strokes happen when a blood vessel that supplies blood to the brain is being blocked.  Hemorrhagic strokes. These strokes result from bleeding in the brain due to a blood vessel leaking or bursting (rupturing). A transient ischemic attack (TIA) is a "warning stroke" that causes stroke-like symptoms that go away quickly. Unlike a stroke, a TIA does not cause permanent damage to the brain. However, the symptoms of a TIA are the same as a stroke, and they also require medical treatment right away. Having a TIA is a sign that you are at higher risk for a permanent stroke. Warning signs of a stroke The symptoms of stroke may vary and will reflect the part of the brain that is involved. Symptoms usually happen suddenly. "BE FAST" is an easy way to remember the main warning signs of a stroke. B - Balance Signs are dizziness, sudden trouble walking, or loss of balance. E - Eyes Signs are trouble seeing or a sudden change in vision. F - Face Signs are sudden weakness or numbness of the face, or the face or eyelid drooping on one side. A - Arms Signs are weakness or numbness in an arm. This happens suddenly and usually on one side of the body. S - Speech Signs are sudden trouble speaking, slurred speech, or trouble  understanding what people say. T - Time Time to call emergency services. Write down what time symptoms started. Other signs of a stroke Some less common signs of a stroke include:  A sudden, severe headache with no known cause.  Nausea or vomiting.  Seizure. A stroke may be happening even if only one "BE FAST" symptoms is present. These symptoms may represent a serious problem that is an emergency. Do not wait to see if the symptoms will go away. Get medical help right away. Call your local emergency services (911 in the [place] Do not drive yourself to the hospital. Summary  A stroke is a medical emergency and should be treated right away--every second counts.  "BE FAST" is an easy way to remember the main warning signs of a stroke.  Call local emergency services right away if you or someone else has any stroke symptoms, even if the symptoms go away.  Make note of what time the first symptoms appeared. Emergency responders or emergency room staff will need to know this information.  Do not wait to see if symptoms will go away. Call 911 even if only one of the "BE FAST" symptoms appears. This information is not intended to replace advice given to you by your health care provider. Make sure you discuss any questions you have with your health care provider. Document Revised: 08/27/2017 Document Reviewed: 01/01/2017 Elsevier Patient Education  2020 Elsevier Inc.  

## 2020-03-08 ENCOUNTER — Ambulatory Visit: Payer: Medicaid Other

## 2020-03-13 ENCOUNTER — Ambulatory Visit: Payer: Medicaid Other

## 2020-03-15 ENCOUNTER — Ambulatory Visit: Payer: Medicaid Other

## 2020-03-20 ENCOUNTER — Ambulatory Visit: Payer: Medicaid Other

## 2020-03-25 ENCOUNTER — Ambulatory Visit: Payer: Medicaid Other | Admitting: Internal Medicine

## 2020-03-25 ENCOUNTER — Other Ambulatory Visit: Payer: Self-pay

## 2020-03-25 ENCOUNTER — Encounter: Payer: Self-pay | Admitting: Internal Medicine

## 2020-03-25 VITALS — BP 154/106 | HR 95 | Temp 98.2°F | Ht 66.0 in | Wt 217.9 lb

## 2020-03-25 DIAGNOSIS — E785 Hyperlipidemia, unspecified: Secondary | ICD-10-CM

## 2020-03-25 DIAGNOSIS — I10 Essential (primary) hypertension: Secondary | ICD-10-CM

## 2020-03-25 DIAGNOSIS — Z1212 Encounter for screening for malignant neoplasm of rectum: Secondary | ICD-10-CM

## 2020-03-25 DIAGNOSIS — Z1211 Encounter for screening for malignant neoplasm of colon: Secondary | ICD-10-CM

## 2020-03-25 MED ORDER — LOSARTAN POTASSIUM 25 MG PO TABS: 25.0000 mg | ORAL_TABLET | Freq: Every day | ORAL | 0 refills | Status: DC

## 2020-03-25 NOTE — Patient Instructions (Addendum)
Thank you for allowing [place] to provide your care today.  As we discussed, your blood pressure is still not at goal.  (Your blood pressure today was 154/106, which is higher than the goal of less than 130/80).  1-Start Losartan 25 mg (one table) once a day 2-Take rest of your medications as before 3- I have ordered some labs for you. I will call if any are abnormal.   4-Check your blood pressure at home twice a day and document the number.  Bring blood pressure log with you next time 5- Follow-up  With your PCP in 4 weeks to recheck blood pressure  As always, if having severe symptoms, please seek medical attention at emergency room. Should you have any questions or concerns please call the internal medicine clinic at [phone].    Thank you!

## 2020-03-25 NOTE — Progress Notes (Signed)
   CC: HTN follow up  HPI:  Ms.Shatonya Nolen Mu is a 54 y.o. female with PMHx as documented below, presented with for HTN follow up. Please refer to problem based charting for further details and assessment and plan of current problem and chronic medical conditions.   Past Medical History:  Diagnosis Date  . HTN (hypertension)   . Kidney stones   . Parkinson's disease (HCC)   . Tremors of nervous system    Review of Systems:   Constitutional: Negative for chills and fever.  Respiratory: Negative for shortness of breath.   Cardiovascular: Negative for chest pain and leg swelling.  Gastrointestinal: Negative for abdominal pain, nausea and vomiting.  Neurological: Negative for dizziness and headaches. Positive for tremor  Physical Exam:  Vitals:   03/25/20 1321  BP: (!) 154/106  Pulse: 95  Temp: 98.2 F (36.8 C)  TempSrc: Oral  SpO2: 100%  Weight: 217 lb 14.4 oz (98.8 kg)  Height: 5\' 6"  (1.676 m)   Physical Exam Constitutional:      General: She is not in acute distress.    Appearance: She is obese. She is not ill-appearing.  Cardiovascular:     Rate and Rhythm: Normal rate and regular rhythm.  Pulmonary:     Effort: Pulmonary effort is normal.     Breath sounds: Normal breath sounds.  Abdominal:     General: There is no distension.     Palpations: Abdomen is soft.     Tenderness: There is no abdominal tenderness.  Musculoskeletal:     Right lower leg: No edema.     Left lower leg: No edema.  Skin:    General: Skin is warm and dry.  Neurological:     Mental Status: She is alert.     Comments: Resting tremor   Psychiatric:        Mood and Affect: Mood normal.        Behavior: Behavior normal.        Thought Content: Thought content normal.        Judgment: Judgment normal.    Assessment & Plan:   See Encounters Tab for problem based charting.  Patient discussed with Dr. 

## 2020-03-26 ENCOUNTER — Other Ambulatory Visit: Payer: Self-pay | Admitting: *Deleted

## 2020-03-26 ENCOUNTER — Other Ambulatory Visit: Payer: Self-pay | Admitting: Internal Medicine

## 2020-03-26 LAB — LIPID PANEL
Chol/HDL Ratio: 4.2 ratio (ref 0.0–4.4)
Cholesterol, Total: 208 mg/dL — ABNORMAL HIGH (ref 100–199)
HDL: 50 mg/dL (ref >39)
LDL Chol Calc (NIH): 108 mg/dL — ABNORMAL HIGH (ref 0–99)
Triglycerides: 290 mg/dL — ABNORMAL HIGH (ref 0–149)
VLDL Cholesterol Cal: 50 mg/dL — ABNORMAL HIGH (ref 5–40)

## 2020-03-26 LAB — BMP8+ANION GAP
Anion Gap: 17 mmol/L (ref 10.0–18.0)
BUN/Creatinine Ratio: 16 (ref 9–23)
BUN: 12 mg/dL (ref 6–24)
CO2: 23 mmol/L (ref 20–29)
Calcium: 9.9 mg/dL (ref 8.7–10.2)
Chloride: 102 mmol/L (ref 96–106)
Creatinine, Ser: 0.77 mg/dL (ref 0.57–1.00)
GFR calc Af Amer: 101 mL/min/{1.73_m2} (ref >59)
GFR calc non Af Amer: 88 mL/min/{1.73_m2} (ref >59)
Glucose: 128 mg/dL — ABNORMAL HIGH (ref 65–99)
Potassium: 3.8 mmol/L (ref 3.5–5.2)
Sodium: 142 mmol/L (ref 134–144)

## 2020-03-26 MED ORDER — AMLODIPINE BESYLATE 10 MG PO TABS: 10.0000 mg | ORAL_TABLET | Freq: Every day | ORAL | 1 refills | Status: DC

## 2020-03-26 MED ORDER — ROSUVASTATIN CALCIUM 10 MG PO TABS: 10.0000 mg | ORAL_TABLET | Freq: Every day | ORAL | 0 refills | Status: DC

## 2020-03-26 NOTE — Telephone Encounter (Signed)
This is a duplicate request

## 2020-03-27 ENCOUNTER — Encounter: Payer: Self-pay | Admitting: Internal Medicine

## 2020-03-27 LAB — FECAL OCCULT BLOOD, IMMUNOCHEMICAL: Fecal Occult Bld: NEGATIVE

## 2020-03-27 NOTE — Assessment & Plan Note (Signed)
Patient started on Crestor 10 mg daily last visit on 01/10/2020 given 10-year ASCVD risk score of 10.8%.  She reports compliance to that and tolerates it very well. -Continue moderate intensity statin (Crestor 10 mg daily) -Checking LDL today

## 2020-03-27 NOTE — Progress Notes (Signed)
Internal Medicine Clinic Attending  Case discussed with Dr. Masoudi  at the time of the visit.  We reviewed the resident's history and exam and pertinent patient test results.  I agree with the assessment, diagnosis, and plan of care documented in the resident's note.  

## 2020-03-27 NOTE — Assessment & Plan Note (Signed)
Patient is on HCTZ  25 mg daily and  amlodipine 10 mg QD. Patient reports compliance to her medication. BP today is 154/106. Her last blood pressure on prior visits were as below: BP Readings from Last 3 Encounters:  03/25/20 (!) 154/106  03/06/20 (!) 140/104  01/15/20 (!) 153/100   -Start Losartan 25 mg QD -Continue HCTZ 25 mg daily and amlodipine to 10 mg daily* -BMP today

## 2020-04-08 ENCOUNTER — Ambulatory Visit: Payer: Medicaid Other | Admitting: Physical Therapy

## 2020-04-08 ENCOUNTER — Ambulatory Visit: Payer: Medicaid Other | Attending: Neurology | Admitting: Occupational Therapy

## 2020-04-08 ENCOUNTER — Encounter: Payer: Self-pay | Admitting: Physical Therapy

## 2020-04-08 ENCOUNTER — Ambulatory Visit: Payer: Medicaid Other

## 2020-04-08 ENCOUNTER — Other Ambulatory Visit: Payer: Self-pay

## 2020-04-08 ENCOUNTER — Encounter: Payer: Self-pay | Admitting: Occupational Therapy

## 2020-04-08 VITALS — BP 123/92 | HR 90

## 2020-04-08 DIAGNOSIS — R293 Abnormal posture: Secondary | ICD-10-CM | POA: Diagnosis not present

## 2020-04-08 DIAGNOSIS — R2689 Other abnormalities of gait and mobility: Secondary | ICD-10-CM

## 2020-04-08 DIAGNOSIS — R2681 Unsteadiness on feet: Secondary | ICD-10-CM | POA: Diagnosis not present

## 2020-04-08 DIAGNOSIS — R278 Other lack of coordination: Secondary | ICD-10-CM

## 2020-04-08 DIAGNOSIS — R29818 Other symptoms and signs involving the nervous system: Secondary | ICD-10-CM

## 2020-04-08 DIAGNOSIS — M25611 Stiffness of right shoulder, not elsewhere classified: Secondary | ICD-10-CM | POA: Diagnosis not present

## 2020-04-08 DIAGNOSIS — M6281 Muscle weakness (generalized): Secondary | ICD-10-CM | POA: Insufficient documentation

## 2020-04-08 DIAGNOSIS — R251 Tremor, unspecified: Secondary | ICD-10-CM

## 2020-04-08 DIAGNOSIS — R471 Dysarthria and anarthria: Secondary | ICD-10-CM

## 2020-04-08 DIAGNOSIS — M25621 Stiffness of right elbow, not elsewhere classified: Secondary | ICD-10-CM | POA: Diagnosis not present

## 2020-04-08 DIAGNOSIS — R29898 Other symptoms and signs involving the musculoskeletal system: Secondary | ICD-10-CM | POA: Diagnosis not present

## 2020-04-08 DIAGNOSIS — M25641 Stiffness of right hand, not elsewhere classified: Secondary | ICD-10-CM | POA: Diagnosis not present

## 2020-04-08 NOTE — Therapy (Signed)
[facility] [address] [place], [place], [address] Phone: [phone]   Fax:  [phone]  Physical Therapy Evaluation  Patient Details  Name: Teresa Logan MRN: [id number] Date of Birth: May 21, 1966 Referring Provider (PT): Kerin Salen, DO   Encounter Date: 04/08/2020   PT End of Session - 04/08/20 1158    Visit Number 1    Number of Visits 13   initial 3 visits, plus additional 9 for 12 PT treatments total   Authorization Type Medicaid - awaiting auth    Authorization - Visit Number 0    Authorization - Number of Visits 4    PT Start Time 1103    PT Stop Time 1150    PT Time Calculation (min) 47 min    Equipment Utilized During Treatment Gait belt    Activity Tolerance Patient tolerated treatment well;Other (comment)   tearful during eval during balance test, needing prolonged seated break   Behavior During Therapy [facility] for tasks assessed/performed   tearful/emotional          Past Medical History:  Diagnosis Date  . HTN (hypertension)   . Kidney stones   . Parkinson's disease (HCC)   . Tremors of nervous system     Past Surgical History:  Procedure Laterality Date  . TONSILLECTOMY      There were no vitals filed for this visit.    Subjective Assessment - 04/08/20 1106    Subjective Has been following up with her PCP regarding her BP. Reports that she goes really slow with her walking, some days feel better than other days. Some days she is more stiff and feels weaker than other days. No falls. Doesn't use any kind of AD to help her walk. Doing very little for exercise at the moment. States that she does not have much endurance.    Pertinent History PD (formally diagnosed in 2019), HTN    How long can you walk comfortably? in the grocery store when leaning on the cart - can go for 30 minutes.    Patient Stated Goals wants to get stronger and healthier, wants to establish a regular exercise routine      Currently in Pain? No/denies              [facility] PT Assessment - 04/08/20 1110      Assessment   Medical Diagnosis PD    Referring Provider (PT) Tat, Lurena Joiner, DO    Onset Date/Surgical Date --   formally diagnosed 2019   Hand Dominance Right      Precautions   Precautions None      Balance Screen   Has the patient fallen in the past 6 months No    Has the patient had a decrease in activity level because of a fear of falling?  Yes    Is the patient reluctant to leave their home because of a fear of falling?  Yes      Home Environment   Living Environment Private residence    Living Arrangements Spouse/significant other;Children;Other (Comment)   4 children - 14, 16, 18, 20   Type of Home House    Home Access Stairs to enter    Entrance Stairs-Number of Steps 3   makes sure someone else is there to help   Entrance Stairs-Rails None    Home Layout One level    Home Equipment Shower seat;Grab bars - toilet;Grab bars - tub/shower    Additional Comments children help clean, cook,  getting dressed. thinks that she asks her children to go do things that she can do herself "such as get a glass of water". spends most of the day in the bed, gets up every hour - "but I could do more and I know I need to be more active"      Prior Function   Level of Independence Independent    Vocation On disability   was an [employer] fundraiser   Leisure enjoys traveling, going out to eat, going to plays, swimming, watch tv      Observation/Other Assessments   Observations resting tremor R and and RLE, and L hand (more mild)      Sensation   Light Touch Appears Intact      Coordination   Gross Motor Movements are Fluid and Coordinated Yes      Posture/Postural Control   Posture/Postural Control Postural limitations    Postural Limitations Forward head      ROM / Strength   AROM / PROM / Strength Strength      Strength   Strength Assessment Site Hip;Knee;Ankle    Right/Left Hip Right;Left    Right Hip  Flexion 3+/5    Left Hip Flexion 4-/5    Right/Left Knee Right;Left    Right Knee Flexion 4/5    Right Knee Extension 4/5    Left Knee Flexion 4+/5    Left Knee Extension 4+/5    Right/Left Ankle Right;Left    Right Ankle Dorsiflexion 4/5    Left Ankle Dorsiflexion 4+/5      Transfers   Transfers Sit to Stand;Stand to Sit    Sit to Stand 5: Supervision;Without upper extremity assist;From chair/3-in-1    Five time sit to stand comments  31.38 seconds from standard height chair with no UE support    Stand to Sit 5: Supervision;Without upper extremity assist;To chair/3-in-1      Ambulation/Gait   Ambulation/Gait Yes    Ambulation/Gait Assistance 5: Supervision    Ambulation Distance (Feet) --   clinic distances throughout eval   Assistive device None    Gait Pattern Step-through pattern;Decreased arm swing - right;Decreased arm swing - left;Decreased stride length;Decreased hip/knee flexion - right;Right foot flat;Left foot flat;Poor foot clearance - right;Trunk flexed    Ambulation Surface Level;Indoor    Gait velocity 19.6 seconds = 1.67 ft/sec      Standardized Balance Assessment   Standardized Balance Assessment Mini-BESTest;Timed Up and Go Test      Mini-BESTest   Sit To Stand Normal: Comes to stand without use of hands and stabilizes independently.    Rise to Toes < 3 s.    Stand on one leg (left) Severe: Unable    Stand on one leg (right) Moderate: < 20 s   2 seconds, 3 seconds   Stand on one leg - lowest score 0    Compensatory Stepping Correction - Forward Moderate: More than one step is required to recover equilibrium    Compensatory Stepping Correction - Backward Moderate: More than one step is required to recover equilibrium    Compensatory Stepping Correction - Left Lateral Normal: Recovers independently with 1 step (crossover or lateral OK)    Compensatory Stepping Correction - Right Lateral Normal: Recovers independently with 1 step (crossover or lateral OK)     Stepping Corredtion Lateral - lowest score 2    Stance - Feet together, eyes open, firm surface  Normal: 30s    Change in Gait Speed Moderate: Unable to change walking speed  or signs of imbalance    Walk with head turns - Horizontal Moderate: performs head turns with reduction in gait speed.    Timed UP & GO with Dual Task Moderate: Dual Task affects either counting OR walking (>10%) when compared to the TUG without Dual Task.      Timed Up and Go Test   Normal TUG (seconds) 16.47    Manual TUG (seconds) 20.47    Cognitive TUG (seconds) 28.78   starting at 77 and counting backwards by 3   TUG Comments no AD                      Objective measurements completed on examination: See above findings.               PT Education - 04/08/20 1158    Education Details clinical findings, POC    Person(s) Educated Patient    Methods Explanation    Comprehension Verbalized understanding            PT Short Term Goals - 04/08/20 1203      PT SHORT TERM GOAL #1   Title Pt will be independent with initial HEP to build upon functional gains made in therapy. ALL STGS DUE AFTER 3 VISITS.    Baseline currently dependent.    Time 3    Period --   visits   Status New      PT SHORT TERM GOAL #2   Title Pt will finish assessment of miniBEST - LTG to be written as appropriate to determine fall risk.    Baseline unable to complete today - pt was tearful and emotional during test    Time 3   visits   Status New      PT SHORT TERM GOAL #3   Title Pt will decr 5x sit <> stand time to 27 seconds or less without UE support in order to demo improved BLE strength.    Baseline 31.38 seconds    Time 3   visits   Status New      PT SHORT TERM GOAL #4   Title Pt will decr TUG time with no AD to 14.5 seconds or less in order to demo decr fall risk.    Baseline 16.47 seconds    Time 3   visits   Status New             PT Long Term Goals - 04/08/20 1207      PT LONG TERM  GOAL #1   Title Pt will be independent with final HEP to build upon functional gains made in therapy. ALL LTGS DUE AFTER 12 VISITS.Marland Kitchen.    Baseline currently dependent    Time 12    Period --   visits   Status New      PT LONG TERM GOAL #2   Title miniBEST goal to be written as appropriate in order to demo decr fall risk.    Baseline not yet assessed.    Time 12   visits   Status New      PT LONG TERM GOAL #3   Title Pt will improve gait speed to at least 2.2 ft/sec in order to demo improved gait efficiency and decr fall risk.    Baseline 19.6 seconds = 1.67 ft/sec    Time 12   visits   Status New      PT LONG TERM GOAL #4   Title Pt will decr  cog TUG to 22 seconds or less in order to demo improved dual tasking with gait.    Baseline 28.78 seconds    Time 12   visits   Status New      PT LONG TERM GOAL #5   Title Pt will decr 5x sit <> stand time to 22 seconds or less without UE support in order to demo improved BLE strength.    Baseline 31.38 seconds    Time 12    Period --   visits   Status New      Additional Long Term Goals   Additional Long Term Goals Yes      PT LONG TERM GOAL #6   Title Pt will verbalize understanding of local PD community resources.    Baseline currently dependent.    Time 12   visits   Status New                  Plan - 04/08/20 1200    Clinical Impression Statement Patient is a 54 year old female referred to Neuro OPPT for PD (pt formally diagnosed in 2019). Pt's PMH is significant for: PD, HTN. Pt's BP WFL for therapy during OT eval today. The following deficits were present during the exam: impaired coordination, postural abnormalities, decreased BLE strength, RUE/RLE tremor, gait abnormalities, decr endurance, impaired balance, decr dual tasking with gait, bradykinesia.  Based on TUG, gait speed, 5x sit <> stand pt is at a high risk for falls. Unable to complete miniBEST today due to pt becoming tearful/emotional - will finish assessing  at next session. Pt would benefit from skilled PT to address these impairments and functional limitations to maximize functional mobility independence    Personal Factors and Comorbidities Comorbidity 2    Comorbidities PD, HTN    Examination-Activity Limitations Locomotion Level;Stand;Stairs;Squat;Hygiene/Grooming;Dressing;Transfers;Caring for Others    Examination-Participation Restrictions Community Activity;Cleaning;Meal Prep;Shop    Stability/Clinical Decision Making Evolving/Moderate complexity    Clinical Decision Making Moderate    Rehab Potential Good    PT Frequency 1x / week   plus additional 2x week for 4 weeks   PT Duration 3 weeks   plus additional 2x week for 4 weeks   PT Treatment/Interventions ADLs/Self Care Home Management;DME Instruction;Gait training;Stair training;Balance training;Therapeutic exercise;Therapeutic activities;Functional mobility training;Neuromuscular re-education;Patient/family education;Energy conservation;Vestibular    PT Next Visit Plan monitor BP. finish assessing miniBEST, initial HEP for functional BLE strength/standing balance. seated PWR moves.    Consulted and Agree with Plan of Care Patient           Patient will benefit from skilled therapeutic intervention in order to improve the following deficits and impairments:  Abnormal gait, Decreased activity tolerance, Decreased balance, Decreased coordination, Decreased endurance, Decreased strength, Difficulty walking, Impaired tone, Postural dysfunction  Visit Diagnosis: Unsteadiness on feet  Other lack of coordination  Abnormal posture  Other symptoms and signs involving the nervous system  Muscle weakness (generalized)  Other abnormalities of gait and mobility     Problem List Patient Active Problem List   Diagnosis Date Noted  . Hyperlipidemia 01/10/2020  . Parkinson's disease (HCC)   . Hypertension   . Screening for colorectal cancer   . Breast cancer screening     Drake Leach, PT, DPT  04/08/2020, 12:15 PM  [facility] [address] [place], [place], [address] Phone: [phone]   Fax:  [phone]  Name: Keirstyn Aydt MRN: [id number] Date of Birth: [birth year]

## 2020-04-08 NOTE — Therapy (Signed)
[facility] [address] [place], [place], [address] Phone: [phone]   Fax:  [phone]  Speech Language Pathology Evaluation  Patient Details  Name: Teresa Logan MRN: [id number] Date of Birth: 19-Jun-1966 Referring Provider (SLP): Kerin Salen, DO   Encounter Date: 04/08/2020   End of Session - 04/08/20 2321    Visit Number 1    Number of Visits 1    Date for SLP Re-Evaluation 04/08/20    SLP Start Time 1021    SLP Stop Time  1052    SLP Time Calculation (min) 31 min    Activity Tolerance Patient tolerated treatment well           Past Medical History:  Diagnosis Date   HTN (hypertension)    Kidney stones    Parkinson's disease (HCC)    Tremors of nervous system     Past Surgical History:  Procedure Laterality Date   TONSILLECTOMY      There were no vitals filed for this visit.   Subjective Assessment - 04/08/20 2318    Subjective "I don't have any problems. I ask people to repeat themselves more than they ask me! My hearing is going!"    Currently in Pain? No/denies              SLP Evaluation [facility] - 04/08/20 1025      SLP Visit Information   SLP Received On 04/08/20    Referring Provider (SLP) Tat, Lurena Joiner, DO    Onset Date approx 8 years ago    Medical Diagnosis Early Onset Parkinsons      Subjective   Patient/Family Stated Goal Benefit from any therapy she can      General Information   HPI Pt having physical PD symptoms since 2013-2014. 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       Prior Functional Status   Cognitive/Linguistic Baseline Within functional limits    Type of Home House     Lives With Family    Available Support Family    Vocation On disability      Cognition   Overall Cognitive Status Within Functional Limits for tasks assessed      Auditory Comprehension   Overall Auditory Comprehension Appears within functional limits  for tasks assessed      Verbal Expression   Overall Verbal Expression Appears within functional limits for tasks assessed      Oral Motor/Sensory Function   Overall Oral Motor/Sensory Function Appears within functional limits for tasks assessed  Slight incoordination with lingual musculature with alternate motion task (?)     Motor Speech   Overall Motor Speech Appears within functional limits for tasks assessed  Pt volume low-mid 70s dB. Pt denies excessive asking her to repeat.                          SLP Education - 04/08/20 2320    Education Details HEP procedure, further needs of ST    Person(s) Educated Patient    Methods Explanation    Comprehension Verbalized understanding                Plan - 04/08/20 2322    Clinical Impression Statement Pt presents today with WNL speech volume (low to mid 70s dB in 20 minutes of simple to mod complex conversation), without reported s/sx of dysphagia. Pt was provided overt s/sx dysphagia, and loud /a/  to complete x10/day with rationale as to why. Skilled ST not necessary at this time however pt would benefit from ST screen approx 6 months after d/c from PT and OT.    Speech Therapy Frequency One time visit    Consulted and Agree with Plan of Care Patient           Patient will benefit from skilled therapeutic intervention in order to improve the following deficits and impairments:   Dysarthria and anarthria    Problem List Patient Active Problem List   Diagnosis Date Noted   Hyperlipidemia 01/10/2020   Parkinson's disease (HCC)    Hypertension    Screening for colorectal cancer    Breast cancer screening     [facility] 04/08/2020, 11:33 PM  [facility] [address] [place], [place], [address] Phone: [phone]   Fax:  [phone]  Name: Teresa Logan MRN: [id number] Date of Birth: May 09, 1966

## 2020-04-08 NOTE — Patient Instructions (Signed)
   TEN LOUD "[id number]" each day. Hold out strong - 8/10 effort.  Filling up the balloon (your abdomen) with air when you think about it.  GET A BELLY BREATH BEFORE EACH "AH"  ======================= If you have abnormal amount of coughing, throat clearing, or food not passing easily through the throat, contact Dr. Arbutus Leas. She'll probably set up a swallowing test at the hospital.

## 2020-04-08 NOTE — Addendum Note (Signed)
Addended by: Willa Frater D on: 04/08/2020 02:49 PM   Modules accepted: Orders

## 2020-04-08 NOTE — Therapy (Signed)
[facility] [address] [place], [place], [address] Phone: [phone]   Fax:  [phone]  Occupational Therapy Evaluation  Patient Details  Name: Teresa Logan MRN: [id number] Date of Birth: 11-23-1965 Referring Provider (OT): Dr. Lurena Joiner Tat    Encounter Date: 04/08/2020   OT End of Session - 04/08/20 1201    Visit Number 1    Number of Visits 16    Date for OT Re-Evaluation 06/07/20    Authorization Type [employer] Medicaid--awaiting auth (will request 12 visits +3 visits)    OT Start Time 0935    OT Stop Time 1020    OT Time Calculation (min) 45 min    Activity Tolerance Patient tolerated treatment well    Behavior During Therapy [facility] for tasks assessed/performed           Past Medical History:  Diagnosis Date  . HTN (hypertension)   . Kidney stones   . Parkinson's disease (HCC)   . Tremors of nervous system     Past Surgical History:  Procedure Laterality Date  . TONSILLECTOMY      Vitals:   04/08/20 0938  BP: (!) 123/92  Pulse: 90     Subjective Assessment - 04/08/20 0939    Subjective  Pt reports that she doesn't use her RUE much as it is feeling weak.    Pertinent History Young Onset Parkinson's disease.  PMH:  HTN, HLD, ?hx of essential tremor diagnosis    Patient Stated Goals increase strength and use of R hand    Currently in Pain? No/denies             [facility] OT Assessment - 04/08/20 0940      Assessment   Medical Diagnosis Parkinson's disease     Referring Provider (OT) Dr. 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   Leisure enjoys traveling, going out to eat, going to plays, swimming, watch tv      ADL   Eating/Feeding Needs assist with cutting food   using LUE now   Grooming Modified independent   using LUE (keeps hair short, no make-up)   Upper Body Bathing Modified independent   using LUE    Lower Body Bathing Modified independent   using LUE, difficulty for feet   Upper Body Dressing Minimal assistance   only wears dress, A for pulling down, doesn't wear bra   Lower Body Dressing --   doesn't wear pants/underwear, able to do socks/shoes   Toilet Transfer Modified independent    Toileting - Clothing Manipulation Modified independent    Toileting -  Hygiene Modified Independent    Tub/Shower Transfer Modified independent   tub/shower combo   Biomedical engineer seat with back   but doesn't use   Transfers/Ambulation Related to ADL's may have to  pull herself up    ADL comments pt reports difficulty opening bottles      IADL   Prior Level of Function Shopping now uses cart, able to retrieve items from shelf    Shopping Assistance for transportation    Prior Level of Function Light Housekeeping independent, but hasn't performed in approx 1-75yrs (delegates)    Light Housekeeping Does not participate in any housekeeping tasks    Meal Prep --   cooked with assist until a month, now not performing    Prior Level of Function Community Mobility hasn't driven since 2012    Community Mobility Relies on family or friends for transportation   hasn't driven since 2012   Medication Management Is responsible for taking medication in correct dosages at correct time    Financial Management --   independent (pays by phone)     Mobility   Mobility Status Independent    Mobility Status Comments see PT eval, needs to rest frequently      Written Expression    Dominant Hand Right    Handwriting --   pt reports difficulty, inconsistent     Cognition   Overall Cognitive Status Within Functional Limits for tasks assessed   pt denies changes     Observation/Other Assessments   Other Surveys  Select    Physical Performance Test   Yes    Simulated Eating Comments 24.5sec using gross grasp RUE    Donning Doffing Jacket Time (seconds) 35.75sec      Posture/Postural Control   Posture/Postural Control Postural limitations    Postural Limitations Rounded Shoulders;Forward head      Coordination   Gross Motor Movements are Fluid and Coordinated No    Fine Motor Movements are Fluid and Coordinated No    9 Hole Peg Test Right;Left    Right 9 Hole Peg Test 70.57    Left 9 Hole Peg Test 52.12    Box and Blocks R-23blocks, L-34blocks    Tremors RUE primarily resting tremor    pt notes more when time for next med dose   Other pt reports difficulty releasing items with RUE    Coordination significant bradykinesia noted      Tone   Assessment Location Right Upper Extremity;Left Upper Extremity      ROM / Strength   AROM / PROM / Strength AROM      AROM   Overall AROM  Deficits    Overall AROM Comments R shoulder flex 130* with -50* elbow ext (-35* elbow ext at midrange), decr thumb abduction and MP ext noted.  Pt holds R hand with MPs in flex and fingers adducted and decr MP ext of thumb      RUE Tone   RUE Tone Moderate      LUE Tone   LUE Tone Mild                           OT Education - 04/08/20 1158    Education Details OT eval results and POC, importance of incr RUE use    Person(s) Educated Patient    Methods Explanation    Comprehension Verbalized understanding            OT Short Term Goals - 04/08/20 1220      OT SHORT TERM GOAL #1   Title Pt will be independent with PD-specific HEP.--check STGs 05/09/20    Baseline no HEP    Time 4  Period Weeks    Status New      OT SHORT TERM GOAL #2   Title Pt  will be independent with R hand splint wear/care for improved R hand positioning, decr risk of contracture, and manage rigidity.    Baseline no splint, pt with decr MP ext and thumb abduction and moderate rigidity    Time 4    Period Weeks    Status New      OT SHORT TERM GOAL #3   Title Pt will improve BUE coordination/functional reaching for ADLs as shown by improving score on box and blocks test by at least 5 blocks bilaterally.    Baseline R-23, L-34 blocks    Time 4    Period Weeks    Status New      OT SHORT TERM GOAL #4   Title Pt will demo ability to retrieve light object from overhead shelf using at least 135* R shoulder flex with elbow ext -35* or greater.    Baseline 130* shoulder flex with -50* elbow ext.    Time 4    Period Weeks    Status New      OT SHORT TERM GOAL #5   Title Pt will demo improved coordination for ADLs as shown by improving time on 9-hole peg test by at least 8sec bilaterally.    Baseline R-70.57sec, L-52.12sec    Time 4    Period Weeks    Status New      Additional Short Term Goals   Additional Short Term Goals --      OT SHORT TERM GOAL #6   Title Pt will be able to do simple maintenance/meal prep tasks mod I.    Baseline children performing    Time 8    Period Weeks    Status New             OT Long Term Goals - 04/08/20 1359      OT LONG TERM GOAL #1   Title Pt will verbalize understanding of adaptive strategies to incr ease, safety, and independence/participation with ADLs/IADLs.--check LTGs 06/09/20    Baseline dependent    Time 8    Period Weeks    Status New      OT LONG TERM GOAL #2   Title Pt will improve RUE coordination/functional reaching for ADLs as shown by improving score on box and blocks test by at least 10 blocks bilaterally.    Baseline R-23 blocks    Time 8    Period Weeks    Status New      OT LONG TERM GOAL #3   Title Pt will demo improved coordination for ADLs as shown by improving time on 9-hole peg  test by at least 15sec with RUE.    Baseline 70.57sec    Time 8    Period Weeks    Status New      OT LONG TERM GOAL #4   Title Pt will demo improved ease/decr bradykinesia for with dressing as shown by improving time on PPT#4 (donning/doffing jacket) by at least 8sec.    Baseline 35.75sec    Time 8    Period Weeks    Status New      OT LONG TERM GOAL #5   Title Pt will be able to use dominant RUE for bathing and eating at least 25% of the time.    Baseline only using LUE currently    Time 8    Period Weeks  Status New      Long Term Additional Goals   Additional Long Term Goals Yes      OT LONG TERM GOAL #6   Title Pt will verbalize understanding of ways to decr risk of future complications related to PD and appropriate community resources.    Baseline dependent    Time 8    Period Weeks    Status New                 Plan - 04/08/20 1204    Clinical Impression Statement Pt is a 54 y.o. female with young-onset Parkinson's disease (first symptoms at approx 54 y.o.).  Pt with PMH that includes HTN, HLD, ?hx of essential tremor diagnosis.  Pt reports functional decline, particularly over the last 1-2 years.  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  Pt presents today with decr ROM, tremor, decr coordination, decr balance and functional mobility, abnormal posture, rigidity, bradykinesia, decr dominant RUE functional use.  Pt would benefit from occupational therapy to address these deficits for improved dominant RUE functional use, decr risk of future complications, incr ADL/IADL participation and independence, and improved quality of life.    OT Occupational Profile and History Detailed Assessment- Review of Records and additional review of physical, cognitive, psychosocial history related to current functional performance    Occupational performance deficits (Please refer to evaluation for details):  ADL's;IADL's    Body Structure / Function / Physical Skills ADL;Balance;IADL;ROM;Dexterity;Improper spinal/pelvic alignment;Mobility;Flexibility;Tone;FMC;Coordination;Decreased knowledge of precautions;Decreased knowledge of use of DME;GMC;UE functional use    Rehab Potential Good    Clinical Decision Making Several treatment options, min-mod task modification necessary    Comorbidities Affecting Occupational Performance: May have comorbidities impacting occupational performance    Modification or Assistance to Complete Evaluation  Min-Moderate modification of tasks or assist with assess necessary to complete eval    OT Frequency 2x / week    OT Duration 8 weeks   (or 15 visits +evaluation due to Medicaid visit limitations)   OT Treatment/Interventions Self-care/ADL training;Moist Heat;Fluidtherapy;DME and/or AE instruction;Splinting;Therapeutic activities;Aquatic Therapy;Therapeutic exercise;Passive range of motion;Functional Mobility Training;Neuromuscular education;Paraffin;Manual Therapy;Energy conservation;Patient/family education    Plan initiate HEP (seated/supine PWR!, PWR! hands)    Consulted and Agree with Plan of Care Patient           Patient will benefit from skilled therapeutic intervention in order to improve the following deficits and impairments:   Body Structure / Function / Physical Skills: ADL, Balance, IADL, ROM, Dexterity, Improper spinal/pelvic alignment, Mobility, Flexibility, Tone, FMC, Coordination, Decreased knowledge of precautions, Decreased knowledge of use of DME, GMC, UE functional use       Visit Diagnosis: Other lack of coordination  Stiffness of right hand, not elsewhere classified  Stiffness of right shoulder, not elsewhere classified  Stiffness of right elbow, not elsewhere classified  Other symptoms and signs involving the nervous system  Other symptoms and signs involving the musculoskeletal system  Abnormal posture  Unsteadiness on  feet  Tremor    Problem List Patient Active Problem List   Diagnosis Date Noted  . Hyperlipidemia 01/10/2020  . Parkinson's disease (HCC)   . Hypertension   . Screening for colorectal cancer   . Breast cancer screening     [facility] ClinicsFREEMAN,Halston Fairclough 04/08/2020, 2:11 PM  [facility] [address] [place], [place], [address] Phone: [phone]   Fax:  [phone]  Name: Wilfred CurtisCynthia Logan MRN:  [id number] Date of Birth: 1966-07-21   Willa Frater, OTR/L [facility] [address] [place], [place]  [address] [phone] phone [phone] 04/08/20 2:11 PM

## 2020-04-10 ENCOUNTER — Other Ambulatory Visit: Payer: Self-pay

## 2020-04-10 ENCOUNTER — Ambulatory Visit (HOSPITAL_COMMUNITY)
Admission: RE | Admit: 2020-04-10 | Discharge: 2020-04-10 | Disposition: A | Discharge status: Home / Self Care | Payer: Medicaid Other | Source: Ambulatory Visit | Attending: Urgent Care | Admitting: Urgent Care

## 2020-04-10 ENCOUNTER — Encounter (HOSPITAL_COMMUNITY): Payer: Self-pay

## 2020-04-10 VITALS — BP 136/97 | HR 97 | Temp 98.2°F | Resp 16

## 2020-04-10 DIAGNOSIS — H9193 Unspecified hearing loss, bilateral: Secondary | ICD-10-CM | POA: Diagnosis not present

## 2020-04-10 DIAGNOSIS — H6123 Impacted cerumen, bilateral: Secondary | ICD-10-CM

## 2020-04-10 NOTE — ED Provider Notes (Signed)
[facility]   MRN: [id number] DOB: [birth year]  Subjective:   Teresa Logan is a 54 y.o. female presenting for right-sided loss of hearing. Has had trouble with earwax in the past. Still uses Q-tips to clean out her ears. Denies fever, ear pain, ear drainage, sinus pain, throat pain, cough.  No current facility-administered medications for this encounter.  Current Outpatient Medications:  .  amLODipine (NORVASC) 10 MG tablet, Take 1 tablet (10 mg total) by mouth daily., Disp: 60 tablet, Rfl: 1 .  aspirin EC 81 MG tablet, Take 162 mg by mouth daily., Disp: , Rfl:  .  carbidopa-levodopa (SINEMET IR) 25-100 MG tablet, Take 1 tablet by mouth 3 (three) times daily., Disp: 270 tablet, Rfl: 1 .  hydrochlorothiazide (HYDRODIURIL) 25 MG tablet, Take 1 tablet (25 mg total) by mouth daily., Disp: 60 tablet, Rfl: 1 .  ibuprofen (ADVIL,MOTRIN) 600 MG tablet, Take 1 tablet (600 mg total) by mouth every 6 (six) hours as needed., Disp: 30 tablet, Rfl: 0 .  losartan (COZAAR) 25 MG tablet, Take 1 tablet (25 mg total) by mouth daily., Disp: 30 tablet, Rfl: 0 .  rosuvastatin (CRESTOR) 10 MG tablet, Take 1 tablet (10 mg total) by mouth daily., Disp: 90 tablet, Rfl: 0   No Known Allergies  Past Medical History:  Diagnosis Date  . HTN (hypertension)   . Kidney stones   . Parkinson's disease (HCC)   . Tremors of nervous system      Past Surgical History:  Procedure Laterality Date  . TONSILLECTOMY      Family History  Problem Relation Age of Onset  . Diabetes type II Mother   . Alzheimer's disease Mother   . Diabetes type II Father   . Kidney disease Father   . Healthy Son   . Healthy Son   . Healthy Son   . Healthy Son     Social History   Tobacco Use  . Smoking status: Passive Smoke Exposure - Never Smoker  . Smokeless tobacco: Never Used  Vaping Use  . Vaping Use: Never used  Substance Use Topics  . Alcohol use: Yes    Alcohol/week: 2.0 standard drinks    Types: 1  Glasses of wine, 1 Cans of beer per week    Comment: 1 drink per day, either wine or beer  . Drug use: Not on file    ROS   Objective:   Vitals: BP (!) 136/97   Pulse 97   Temp 98.2 F (36.8 C)   Resp 16   SpO2 98%   Physical Exam Constitutional:      General: She is not in acute distress.    Appearance: Normal appearance. She is well-developed. She is not ill-appearing.  HENT:     Head: Normocephalic and atraumatic.     Right Ear: There is impacted cerumen.     Left Ear: There is impacted cerumen.     Nose: Nose normal.     Mouth/Throat:     Mouth: Mucous membranes are moist.     Pharynx: Oropharynx is clear.  Eyes:     General: No scleral icterus.    Extraocular Movements: Extraocular movements intact.     Pupils: Pupils are equal, round, and reactive to light.  Cardiovascular:     Rate and Rhythm: Normal rate.  Pulmonary:     Effort: Pulmonary effort is normal.  Skin:    General: Skin is warm and dry.  Neurological:     General: No  focal deficit present.     Mental Status: She is alert and oriented to person, place, and time.  Psychiatric:        Mood and Affect: Mood normal.        Behavior: Behavior normal.    Ear lavage performed using mixture of peroxide and water.  Pressure irrigation performed using a bottle and a thin ear tube.  Bilateral ear lavage.  No curette was used.  Assessment and Plan :   PDMP not reviewed this encounter.  1. Bilateral impacted cerumen   2. Decreased hearing of both ears     Successful bilateral ear lavage.  General management of cerumen impaction reviewed with patient.  Anticipatory guidance provided. Counseled patient on potential for adverse effects with medications prescribed/recommended today, ER and return-to-clinic precautions discussed, patient verbalized understanding.    Wallis Bamberg, PA-C 04/10/20 1730

## 2020-04-10 NOTE — ED Triage Notes (Signed)
Pt c/o loss of hearing to right side

## 2020-04-16 ENCOUNTER — Ambulatory Visit: Payer: Medicaid Other | Admitting: Neurology

## 2020-04-26 ENCOUNTER — Ambulatory Visit: Payer: Medicaid Other | Admitting: Physical Therapy

## 2020-04-29 ENCOUNTER — Encounter: Payer: Self-pay | Admitting: Internal Medicine

## 2020-04-29 ENCOUNTER — Ambulatory Visit: Payer: Medicaid Other | Admitting: Internal Medicine

## 2020-04-29 VITALS — BP 120/90 | HR 99 | Temp 98.1°F | Ht 66.0 in | Wt 218.0 lb

## 2020-04-29 DIAGNOSIS — I10 Essential (primary) hypertension: Secondary | ICD-10-CM | POA: Diagnosis not present

## 2020-04-29 NOTE — Progress Notes (Signed)
   CC: high blood pressure   HPI:[name] is a 54 y.o. female who presents for evaluation of hypertension. Please see individual problem based A/P for details.   Depression, PHQ-9: Based on the patients    Office Visit from 04/29/2020 in [facility]  PHQ-9 Total Score 11      Past Medical History:  Diagnosis Date  . HTN (hypertension)   . Kidney stones   . Parkinson's disease (HCC)   . Tremors of nervous system    Review of Systems:  with past medical history below. Please see problem based charting for details of presentation, assessment, and plan.    Physical Exam: Vitals:   04/29/20 1442  BP: 120/90  Pulse: 99  Temp: 98.1 F (36.7 C)  TempSrc: Oral  SpO2: 100%  Weight: 218 lb (98.9 kg)  Height: 5\' 6"  (1.676 m)    General: NAD, not ill appearing  Cardiovascular: Normal rate, regular rhythm.  No murmurs, rubs, or gallops Pulmonary : Effort normal, breath sounds normal. No wheezes, rales, or rhonchi Abdominal: soft, nontender,  bowel sounds present Psychiatric/Behavioral:  normal mood, normal behavior  Neuro: Resting tremor,   Assessment & Plan:   See Encounters Tab for problem based charting.  Patient discussed with Dr. 

## 2020-04-29 NOTE — Patient Instructions (Signed)
Thank you for trusting me with your care. To recap, today we discussed the following:   1. Essential hypertension     BP Readings from Last 3 Encounters:  04/29/20 120/90  04/10/20 (!) 136/97  04/08/20 (!) 123/92    Your blood pressure is at goal today. We will make no changes to your blood pressure medicine today.  My best,  Thurmon Fair, MD

## 2020-04-30 ENCOUNTER — Other Ambulatory Visit: Payer: Self-pay

## 2020-04-30 ENCOUNTER — Ambulatory Visit: Payer: Medicaid Other | Attending: Neurology | Admitting: Physical Therapy

## 2020-04-30 VITALS — BP 130/88

## 2020-04-30 DIAGNOSIS — R2681 Unsteadiness on feet: Secondary | ICD-10-CM | POA: Diagnosis not present

## 2020-04-30 DIAGNOSIS — R29818 Other symptoms and signs involving the nervous system: Secondary | ICD-10-CM | POA: Diagnosis not present

## 2020-04-30 DIAGNOSIS — M6281 Muscle weakness (generalized): Secondary | ICD-10-CM | POA: Diagnosis not present

## 2020-04-30 DIAGNOSIS — R251 Tremor, unspecified: Secondary | ICD-10-CM | POA: Diagnosis not present

## 2020-04-30 DIAGNOSIS — R293 Abnormal posture: Secondary | ICD-10-CM

## 2020-04-30 DIAGNOSIS — R2689 Other abnormalities of gait and mobility: Secondary | ICD-10-CM | POA: Insufficient documentation

## 2020-04-30 DIAGNOSIS — M25641 Stiffness of right hand, not elsewhere classified: Secondary | ICD-10-CM | POA: Diagnosis not present

## 2020-04-30 DIAGNOSIS — M25621 Stiffness of right elbow, not elsewhere classified: Secondary | ICD-10-CM | POA: Insufficient documentation

## 2020-04-30 DIAGNOSIS — R278 Other lack of coordination: Secondary | ICD-10-CM | POA: Diagnosis not present

## 2020-04-30 DIAGNOSIS — M25611 Stiffness of right shoulder, not elsewhere classified: Secondary | ICD-10-CM | POA: Diagnosis not present

## 2020-04-30 DIAGNOSIS — R29898 Other symptoms and signs involving the musculoskeletal system: Secondary | ICD-10-CM | POA: Diagnosis not present

## 2020-04-30 NOTE — Therapy (Signed)
[facility] [address] [place], [place], [address] Phone: [phone]   Fax:  [phone]  Physical Therapy Treatment  Patient Details  Name: Teresa Logan MRN: [id number] Date of Birth: 04-11-1966 Referring Provider (PT): Kerin Salen, DO   Encounter Date: 04/30/2020   PT End of Session - 04/30/20 1423    Visit Number 2    Number of Visits 13   initial 3 visits, plus additional 9 for 12 PT treatments total   Authorization Type Medicaid - awaiting auth    Authorization - Visit Number 1    Authorization - Number of Visits 4    PT Start Time 1317    PT Stop Time 1358    PT Time Calculation (min) 41 min    Equipment Utilized During Treatment Gait belt    Activity Tolerance Patient tolerated treatment well    Behavior During Therapy [facility] for tasks assessed/performed   tearful/emotional          Past Medical History:  Diagnosis Date  . HTN (hypertension)   . Kidney stones   . Parkinson's disease (HCC)   . Tremors of nervous system     Past Surgical History:  Procedure Laterality Date  . TONSILLECTOMY      Vitals:   04/30/20 1320  BP: 130/88     Subjective Assessment - 04/30/20 1318    Subjective Saw Dr. Barbaraann Faster yesterday. Pt's BP is doing better. Kids finished summer school. No falls.    Pertinent History PD (formally diagnosed in 2019), HTN    How long can you walk comfortably? in the grocery store when leaning on the cart - can go for 30 minutes.    Patient Stated Goals wants to get stronger and healthier, wants to establish a regular exercise routine    Currently in Pain? No/denies              [address] [facility] PT Assessment - 04/30/20 0001      Mini-BESTest   Sit To Stand Normal: Comes to stand without use of hands and stabilizes independently.    Rise to Toes < 3 s.    Stand on one leg (left) Severe: Unable    Stand on one leg (right) Moderate: < 20 s    Stand on one leg - lowest score 0     Compensatory Stepping Correction - Forward Moderate: More than one step is required to recover equilibrium    Compensatory Stepping Correction - Backward Moderate: More than one step is required to recover equilibrium    Compensatory Stepping Correction - Left Lateral Normal: Recovers independently with 1 step (crossover or lateral OK)    Compensatory Stepping Correction - Right Lateral Normal: Recovers independently with 1 step (crossover or lateral OK)    Stepping Corredtion Lateral - lowest score 2    Stance - Feet together, eyes open, firm surface  Normal: 30s    Stance - Feet together, eyes closed, foam surface  Moderate: < 30s    Incline - Eyes Closed Normal: Stands independently 30s and aligns with gravity    Change in Gait Speed Moderate: Unable to change walking speed or signs of imbalance    Walk with head turns - Horizontal Moderate: performs head turns with reduction in gait speed.    Walk with pivot turns Moderate:Turns with feet close SLOW (>4 steps) with good balance.    Step over obstacles Moderate: Steps over box but touches box OR displays cautious behavior by slowing gait.  Timed UP & GO with Dual Task Moderate: Dual Task affects either counting OR walking (>10%) when compared to the TUG without Dual Task.    Mini-BEST total score 16               Pt performs PWR! Moves in sitting position 2 x 10 reps - added to HEP.    PWR! Up for improved posture  Verbal and demo cues for technique, cues for BIG hands when performing.         Access Code: [email or web address] URL: [email or web address] Date: 04/30/2020 Prepared by: Sherlie Ban  Initiated HEP:   Exercises Staggered Stance Forward Backward Weight Shift with Counter Support - 1-2 x daily - 5 x weekly - 2 sets - 10 reps Sit to Stand - 1-2 x daily - 5 x weekly - 2 sets - 5 reps - with no UE support  Lateral Weight Shift with Arm Raise - 1-2 x daily - 5 x weekly - 2 sets - 10 reps - with countertop  support       [facility] Adult PT Treatment/Exercise - 04/30/20 0001      Therapeutic Activites    Therapeutic Activities Other Therapeutic Activities    Other Therapeutic Activities pt reporting feeling more shaky today because she has not eaten today, provided pt with crackers prior to starting session with pt reporting feeling better                  PT Education - 04/30/20 1423    Education Details initial HEP, results of miniBEST    Person(s) Educated Patient    Methods Explanation;Demonstration;Handout    Comprehension Verbalized understanding;Returned demonstration;Need further instruction            PT Short Term Goals - 04/30/20 1433      PT SHORT TERM GOAL #1   Title Pt will be independent with initial HEP to build upon functional gains made in therapy. ALL STGS DUE AFTER 3 VISITS.    Baseline currently dependent.    Time 3    Period --   visits   Status New      PT SHORT TERM GOAL #2   Title Pt will finish assessment of miniBEST - LTG to be written as appropriate to determine fall risk.    Baseline pt scoring a 16/28 - LTG written as appropriate.    Time 3   visits   Status Achieved      PT SHORT TERM GOAL #3   Title Pt will decr 5x sit <> stand time to 27 seconds or less without UE support in order to demo improved BLE strength.    Baseline 31.38 seconds    Time 3   visits   Status New      PT SHORT TERM GOAL #4   Title Pt will decr TUG time with no AD to 14.5 seconds or less in order to demo decr fall risk.    Baseline 16.47 seconds    Time 3   visits   Status New             PT Long Term Goals - 04/30/20 1433      PT LONG TERM GOAL #1   Title Pt will be independent with final HEP to build upon functional gains made in therapy. ALL LTGS DUE AFTER 12 VISITS.Marland Kitchen    Baseline currently dependent    Time 12    Period --   visits   Status New  PT LONG TERM GOAL #2   Title Pt will improve miniBEST to at least a 20/28 in order to decr fall  risk.    Baseline 16/28 on 04/30/20    Time 12   visits   Status Revised      PT LONG TERM GOAL #3   Title Pt will improve gait speed to at least 2.2 ft/sec in order to demo improved gait efficiency and decr fall risk.    Baseline 19.6 seconds = 1.67 ft/sec    Time 12   visits   Status New      PT LONG TERM GOAL #4   Title Pt will decr cog TUG to 22 seconds or less in order to demo improved dual tasking with gait.    Baseline 28.78 seconds    Time 12   visits   Status New      PT LONG TERM GOAL #5   Title Pt will decr 5x sit <> stand time to 22 seconds or less without UE support in order to demo improved BLE strength.    Baseline 31.38 seconds    Time 12    Period --   visits   Status New      PT LONG TERM GOAL #6   Title Pt will verbalize understanding of local PD community resources.    Baseline currently dependent.    Time 12   visits   Status New                 Plan - 04/30/20 1434    Clinical Impression Statement Pt's BP WFL to participate in therapy today. Assessed remainder of miniBEST (unable to complete at eval) with pt scoring a 16/28, putting pt at an incr risk for falls. LTG updated as appropriate. Remainder of session focused on initating HEP for posture, balance, and BLE strength with pt able to demo all correctly. Will continue to progress towards LTGs.    Personal Factors and Comorbidities Comorbidity 2    Comorbidities PD, HTN    Examination-Activity Limitations Locomotion Level;Stand;Stairs;Squat;Hygiene/Grooming;Dressing;Transfers;Caring for Others    Examination-Participation Restrictions Community Activity;Cleaning;Meal Prep;Shop    Stability/Clinical Decision Making Evolving/Moderate complexity    Rehab Potential Good    PT Frequency 1x / week   plus additional 2x week for 4 weeks   PT Duration 3 weeks   plus additional 2x week for 4 weeks   PT Treatment/Interventions ADLs/Self Care Home Management;DME Instruction;Gait training;Stair  training;Balance training;Therapeutic exercise;Therapeutic activities;Functional mobility training;Neuromuscular re-education;Patient/family education;Energy conservation;Vestibular    PT Next Visit Plan monitor BP (use manual cuff) review initial HEP. seated PWR moves, gait training, standing balance for SLS and incr R foot clearance.    Consulted and Agree with Plan of Care Patient           Patient will benefit from skilled therapeutic intervention in order to improve the following deficits and impairments:  Abnormal gait, Decreased activity tolerance, Decreased balance, Decreased coordination, Decreased endurance, Decreased strength, Difficulty walking, Impaired tone, Postural dysfunction  Visit Diagnosis: Unsteadiness on feet  Other lack of coordination  Abnormal posture  Other abnormalities of gait and mobility  Muscle weakness (generalized)     Problem List Patient Active Problem List   Diagnosis Date Noted  . Hyperlipidemia 01/10/2020  . Parkinson's disease (HCC)   . Hypertension   . Screening for colorectal cancer   . Breast cancer screening     Drake Leach, PT, DPT  04/30/2020, 2:38 PM  [facility]  Rehabilitation [facility] [address] [place], [place], [address] Phone: [phone]   Fax:  [phone]  Name: Teresa Logan MRN: [id number] Date of Birth: 01-Jan-1966

## 2020-04-30 NOTE — Patient Instructions (Signed)
Access Code: [email or web address] URL: [email or web address] Date: 04/30/2020 Prepared by: Sherlie Ban  Exercises Staggered Stance Forward Backward Weight Shift with Counter Support - 1-2 x daily - 5 x weekly - 2 sets - 10 reps Sit to Stand - 1-2 x daily - 5 x weekly - 2 sets - 5 reps Lateral Weight Shift with Arm Raise - 1-2 x daily - 5 x weekly - 2 sets - 10 reps

## 2020-05-02 ENCOUNTER — Encounter: Payer: Self-pay | Admitting: Internal Medicine

## 2020-05-02 NOTE — Assessment & Plan Note (Signed)
Hypertension: Patient's BP today is 120/90 with a goal of <140/80. The patient endorses adherence to her medication regimen.She denied, chest pain, headache, visual changes, lightheadedness, weakness, dizziness on standing, swelling in the feet or ankles. Most recent renal function per BMP as below. BMP Latest Ref Rng & Units 03/25/2020 01/10/2020 12/08/2019  Glucose 65 - 99 mg/dL 741(O) 878(M) 767(M)  BUN 6 - 24 mg/dL 12 14 15   Creatinine 0.57 - 1.00 mg/dL 0.94 7.09)  BUN/Creat Ratio 9 - 23 16 14 14   Sodium 134 - 144 mmol/L 142 140 142  Potassium 3.5 - 5.2 mmol/L 3.8 4.3 4.5  Chloride 96 - 106 mmol/L 102 101 101  CO2 20 - 29 mmol/L 23 21 23   Calcium 8.7 - 10.2 mg/dL 9.9 6.28(Z   Medication changes: No  Plan: Continue Losartan 25 mg qd Continue HCTZ 25 mg daily Continue Amlodoipine 10 mg daily Repeat BMP 6 months

## 2020-05-02 NOTE — Addendum Note (Signed)
Addended by: Burnell Blanks on: 05/02/2020 09:54 AM   Modules accepted: Level of Service

## 2020-05-02 NOTE — Progress Notes (Signed)
Internal Medicine Clinic Attending  Case discussed with Dr. Steen  At the time of the visit.  We reviewed the resident's history and exam and pertinent patient test results.  I agree with the assessment, diagnosis, and plan of care documented in the resident's note.  

## 2020-05-06 ENCOUNTER — Other Ambulatory Visit: Payer: Self-pay | Admitting: Internal Medicine

## 2020-05-06 DIAGNOSIS — I10 Essential (primary) hypertension: Secondary | ICD-10-CM

## 2020-05-06 MED ORDER — LOSARTAN POTASSIUM 25 MG PO TABS: 25.0000 mg | ORAL_TABLET | Freq: Every day | ORAL | 0 refills | Status: DC

## 2020-05-06 NOTE — Telephone Encounter (Signed)
Refill request   losartan (COZAAR) 25 MG tablet  [id number] - [place], [place] - [address]

## 2020-05-07 ENCOUNTER — Ambulatory Visit: Payer: Medicaid Other | Admitting: Physical Therapy

## 2020-05-07 ENCOUNTER — Ambulatory Visit: Payer: Medicaid Other | Admitting: Occupational Therapy

## 2020-05-09 ENCOUNTER — Ambulatory Visit: Payer: Medicaid Other | Admitting: Occupational Therapy

## 2020-05-13 ENCOUNTER — Ambulatory Visit: Payer: Medicaid Other | Admitting: Physical Therapy

## 2020-05-13 ENCOUNTER — Ambulatory Visit: Payer: Medicaid Other | Admitting: Occupational Therapy

## 2020-05-16 ENCOUNTER — Ambulatory Visit: Payer: Medicaid Other | Admitting: Physical Therapy

## 2020-05-21 ENCOUNTER — Encounter: Payer: Self-pay | Admitting: Occupational Therapy

## 2020-05-21 ENCOUNTER — Other Ambulatory Visit: Payer: Self-pay

## 2020-05-21 ENCOUNTER — Ambulatory Visit: Payer: Medicaid Other | Admitting: Physical Therapy

## 2020-05-21 ENCOUNTER — Ambulatory Visit: Payer: Medicaid Other | Admitting: Occupational Therapy

## 2020-05-21 VITALS — BP 132/88

## 2020-05-21 DIAGNOSIS — M25621 Stiffness of right elbow, not elsewhere classified: Secondary | ICD-10-CM

## 2020-05-21 DIAGNOSIS — M25641 Stiffness of right hand, not elsewhere classified: Secondary | ICD-10-CM | POA: Diagnosis not present

## 2020-05-21 DIAGNOSIS — R29818 Other symptoms and signs involving the nervous system: Secondary | ICD-10-CM | POA: Diagnosis not present

## 2020-05-21 DIAGNOSIS — M25611 Stiffness of right shoulder, not elsewhere classified: Secondary | ICD-10-CM | POA: Diagnosis not present

## 2020-05-21 DIAGNOSIS — M6281 Muscle weakness (generalized): Secondary | ICD-10-CM | POA: Diagnosis not present

## 2020-05-21 DIAGNOSIS — R2681 Unsteadiness on feet: Secondary | ICD-10-CM | POA: Diagnosis not present

## 2020-05-21 DIAGNOSIS — R2689 Other abnormalities of gait and mobility: Secondary | ICD-10-CM | POA: Diagnosis not present

## 2020-05-21 DIAGNOSIS — R251 Tremor, unspecified: Secondary | ICD-10-CM | POA: Diagnosis not present

## 2020-05-21 DIAGNOSIS — R278 Other lack of coordination: Secondary | ICD-10-CM | POA: Diagnosis not present

## 2020-05-21 DIAGNOSIS — R293 Abnormal posture: Secondary | ICD-10-CM

## 2020-05-21 DIAGNOSIS — R29898 Other symptoms and signs involving the musculoskeletal system: Secondary | ICD-10-CM | POA: Diagnosis not present

## 2020-05-21 NOTE — Therapy (Signed)
[facility] [address] [place], [place], [address] Phone: [phone]   Fax:  [phone]  Physical Therapy Treatment  Patient Details  Name: Teresa Logan MRN: [id number] Date of Birth: December 21, 1965 Referring Provider (PT): Kerin Salen, DO   Encounter Date: 05/21/2020   PT End of Session - 05/21/20 1413    Visit Number 3    Number of Visits 13   initial 3 visits, plus additional 9 for 12 PT treatments total   Authorization Type Medicaid - 8 PT visits approved from 7/30-8/31    Authorization - Visit Number 2    Authorization - Number of Visits 8    PT Start Time 1316    PT Stop Time 1401   4 minutes non billable due to pt using restroom   PT Time Calculation (min) 45 min    Equipment Utilized During Treatment Gait belt    Activity Tolerance Patient tolerated treatment well    Behavior During Therapy [facility] for tasks assessed/performed   tearful/emotional          Past Medical History:  Diagnosis Date  . HTN (hypertension)   . Kidney stones   . Parkinson's disease (HCC)   . Tremors of nervous system     Past Surgical History:  Procedure Laterality Date  . TONSILLECTOMY      Vitals:   05/21/20 1323  BP: 132/88     Subjective Assessment - 05/21/20 1319    Subjective Had a couple deaths in the family. Been busy. Has been doing the exercises at home, but not as frequently as she should.    Pertinent History PD (formally diagnosed in 2019), HTN    How long can you walk comfortably? in the grocery store when leaning on the cart - can go for 30 minutes.    Patient Stated Goals wants to get stronger and healthier, wants to establish a regular exercise routine    Currently in Pain? No/denies                             [facility] Adult PT Treatment/Exercise - 05/21/20 0001      Ambulation/Gait   Ambulation/Gait Yes    Ambulation/Gait Assistance 5: Supervision    Ambulation/Gait Assistance  Details initial cues for posture, had therapist facilitate arm swing B and trunk rotation with use of walking poles for 2 laps with pt able to take longer strides with heel strike, when removing walking poles pt reverts to decr arm swing with RUE and R hand held flexed    Ambulation Distance (Feet) 345 Feet    Assistive device None    Gait Pattern Step-through pattern;Decreased arm swing - right;Decreased arm swing - left;Decreased stride length;Decreased hip/knee flexion - right;Right foot flat;Left foot flat;Poor foot clearance - right;Trunk flexed    Ambulation Surface Level;Indoor      Therapeutic Activites    Therapeutic Activities Other Therapeutic Activities    Other Therapeutic Activities discussed POC going forwards with pt, pt reporting that she would like to drop down to 1x per week after this week due to responsibilities with family at home          Access Code: [facility] URL: [email or web address] Date: 05/21/2020 Prepared by: Sherlie Ban   Reviewed pt's HEP as pt reports has not been performing all exercises at home:   Exercises Lateral Weight Shift with Arm Raise - 1-2 x daily - 5 x weekly - 2  sets - 10 reps - cues for big hands  Stride Stance Weight Shift - 1-2 x daily - 5 x weekly - 2 sets - 10 reps- with single UE support at counter, opposite UE lift with cues for posture and weight shift and big hands and big reaching when shifting forwards Sit to Stand - 1-2 x daily - 5 x weekly - 2 sets - 10 reps- no UE support, cues for tall posture in standing      Pt performs PWR! Moves in sitting position   PWR! Up for improved posture x10 reps - initial demo cues for technique and big hands   PWR! Step for improved step initiation - 2 x 5 reps B, cues for incr foot clearance, step out and out and then step in and in   Cues provided for intensity of movement and technique.   Discussed with pt importance of performing exercises more consistently at home         PT Education - 05/21/20 1411    Education Details reviewed HEP and new additions, gait training, changing POC to drop down to 1x per week, importance of performing exercises consistently at home    Person(s) Educated Patient    Methods Explanation;Demonstration;Handout    Comprehension Verbalized understanding;Returned demonstration            PT Short Term Goals - 04/30/20 1433      PT SHORT TERM GOAL #1   Title Pt will be independent with initial HEP to build upon functional gains made in therapy. ALL STGS DUE AFTER 3 VISITS.    Baseline currently dependent.    Time 3    Period --   visits   Status New      PT SHORT TERM GOAL #2   Title Pt will finish assessment of miniBEST - LTG to be written as appropriate to determine fall risk.    Baseline pt scoring a 16/28 - LTG written as appropriate.    Time 3   visits   Status Achieved      PT SHORT TERM GOAL #3   Title Pt will decr 5x sit <> stand time to 27 seconds or less without UE support in order to demo improved BLE strength.    Baseline 31.38 seconds    Time 3   visits   Status New      PT SHORT TERM GOAL #4   Title Pt will decr TUG time with no AD to 14.5 seconds or less in order to demo decr fall risk.    Baseline 16.47 seconds    Time 3   visits   Status New             PT Long Term Goals - 04/30/20 1433      PT LONG TERM GOAL #1   Title Pt will be independent with final HEP to build upon functional gains made in therapy. ALL LTGS DUE AFTER 12 VISITS.Marland Kitchen    Baseline currently dependent    Time 12    Period --   visits   Status New      PT LONG TERM GOAL #2   Title Pt will improve miniBEST to at least a 20/28 in order to decr fall risk.    Baseline 16/28 on 04/30/20    Time 12   visits   Status Revised      PT LONG TERM GOAL #3   Title Pt will improve gait speed to at least 2.2  ft/sec in order to demo improved gait efficiency and decr fall risk.    Baseline 19.6 seconds = 1.67 ft/sec    Time 12    visits   Status New      PT LONG TERM GOAL #4   Title Pt will decr cog TUG to 22 seconds or less in order to demo improved dual tasking with gait.    Baseline 28.78 seconds    Time 12   visits   Status New      PT LONG TERM GOAL #5   Title Pt will decr 5x sit <> stand time to 22 seconds or less without UE support in order to demo improved BLE strength.    Baseline 31.38 seconds    Time 12    Period --   visits   Status New      PT LONG TERM GOAL #6   Title Pt will verbalize understanding of local PD community resources.    Baseline currently dependent.    Time 12   visits   Status New                 Plan - 05/21/20 1416    Clinical Impression Statement Focus of today's skilled session was reviewing initial HEP as pt reports she has not been consistently performing at home, initial cues needed for technique and intensity. Upgraded stride stance at counter with alternating UE swing and added seated PWR! step to HEP. Pt able to demonstrate improved B arm swing and stride length with PT facilitating arm swing/trunk rotation. However, once removing walking poles, pt with minimal arm swing with LUE and no arm swing with RUE. Will continue to progress towards LTGs.    Personal Factors and Comorbidities Comorbidity 2    Comorbidities PD, HTN    Examination-Activity Limitations Locomotion Level;Stand;Stairs;Squat;Hygiene/Grooming;Dressing;Transfers;Caring for Others    Examination-Participation Restrictions Community Activity;Cleaning;Meal Prep;Shop    Stability/Clinical Decision Making Evolving/Moderate complexity    Rehab Potential Good    PT Frequency 1x / week   plus additional 2x week for 4 weeks   PT Duration 3 weeks   plus additional 2x week for 4 weeks   PT Treatment/Interventions ADLs/Self Care Home Management;DME Instruction;Gait training;Stair training;Balance training;Therapeutic exercise;Therapeutic activities;Functional mobility training;Neuromuscular  re-education;Patient/family education;Energy conservation;Vestibular    PT Next Visit Plan monitor BP (use manual cuff due to tremors) how were new additions? seated PWR moves, gait training, standing balance for SLS and incr R foot clearance.    Consulted and Agree with Plan of Care Patient           Patient will benefit from skilled therapeutic intervention in order to improve the following deficits and impairments:  Abnormal gait, Decreased activity tolerance, Decreased balance, Decreased coordination, Decreased endurance, Decreased strength, Difficulty walking, Impaired tone, Postural dysfunction  Visit Diagnosis: Unsteadiness on feet  Other lack of coordination  Abnormal posture  Muscle weakness (generalized)  Other symptoms and signs involving the nervous system     Problem List Patient Active Problem List   Diagnosis Date Noted  . Hyperlipidemia 01/10/2020  . Parkinson's disease (HCC)   . Hypertension   . Screening for colorectal cancer   . Breast cancer screening     Drake Leach, PT, DPT  05/21/2020, 2:23 PM  [facility] [address] [place], [place], [address] Phone: [phone]   Fax:  [phone]  Name: Cele Mote MRN: [id number] Date of Birth: [birth year]

## 2020-05-21 NOTE — Therapy (Signed)
[facility] [address] [place], [place], [address] Phone: [phone]   Fax:  [phone]  Occupational Therapy Treatment  Patient Details  Name: Teresa Logan MRN: [id number] Date of Birth: 08-26-1966 Referring Provider (OT): Dr. Lurena Joiner Tat    Encounter Date: 05/21/2020   OT End of Session - 05/21/20 1302    Visit Number 2    Number of Visits 16    Date for OT Re-Evaluation 06/07/20    Authorization Type [employer] Medicaid--awaiting auth (will request 12 visits +3 visits)    OT Start Time 1235    OT Stop Time 1315    OT Time Calculation (min) 40 min    Activity Tolerance Patient tolerated treatment well    Behavior During Therapy [facility] for tasks assessed/performed   tearful          Past Medical History:  Diagnosis Date  . HTN (hypertension)   . Kidney stones   . Parkinson's disease (HCC)   . Tremors of nervous system     Past Surgical History:  Procedure Laterality Date  . TONSILLECTOMY      There were no vitals filed for this visit.   Subjective Assessment - 05/21/20 1235    Subjective  Pt missed visits due to 2 deaths in the family    Pertinent History Young Onset Parkinson's disease.  PMH:  HTN, HLD, ?hx of essential tremor diagnosis    Patient Stated Goals increase strength and use of R hand    Currently in Pain? No/denies             Pt instructed in benefits on focusing on large amplitude/PWR! Movements to address rigidity/bradykinesia with PD.         OT Education - 05/21/20 1253    Education Details PWR! hands, Coordination HEP with focus on large amplitude movements--see pt instructions    Person(s) Educated Patient    Methods Explanation;Demonstration;Handout;Verbal cues    Comprehension Returned demonstration;Verbalized understanding;Verbal cues required;Need further instruction            OT Short Term Goals - 04/08/20 1220      OT SHORT TERM GOAL #1   Title Pt will be  independent with PD-specific HEP.--check STGs 05/09/20    Baseline no HEP    Time 4    Period Weeks    Status New      OT SHORT TERM GOAL #2   Title Pt will be independent with R hand splint wear/care for improved R hand positioning, decr risk of contracture, and manage rigidity.    Baseline no splint, pt with decr MP ext and thumb abduction and moderate rigidity    Time 4    Period Weeks    Status New      OT SHORT TERM GOAL #3   Title Pt will improve BUE coordination/functional reaching for ADLs as shown by improving score on box and blocks test by at least 5 blocks bilaterally.    Baseline R-23, L-34 blocks    Time 4    Period Weeks    Status New      OT SHORT TERM GOAL #4   Title Pt will demo ability to retrieve light object from overhead shelf using at least 135* R shoulder flex with elbow ext -35* or greater.    Baseline 130* shoulder flex with -50* elbow ext.    Time 4    Period Weeks    Status New      OT  SHORT TERM GOAL #5   Title Pt will demo improved coordination for ADLs as shown by improving time on 9-hole peg test by at least 8sec bilaterally.    Baseline R-70.57sec, L-52.12sec    Time 4    Period Weeks    Status New      Additional Short Term Goals   Additional Short Term Goals --      OT SHORT TERM GOAL #6   Title Pt will be able to do simple maintenance/meal prep tasks mod I.    Baseline children performing    Time 8    Period Weeks    Status New             OT Long Term Goals - 04/08/20 1359      OT LONG TERM GOAL #1   Title Pt will verbalize understanding of adaptive strategies to incr ease, safety, and independence/participation with ADLs/IADLs.--check LTGs 06/09/20    Baseline dependent    Time 8    Period Weeks    Status New      OT LONG TERM GOAL #2   Title Pt will improve RUE coordination/functional reaching for ADLs as shown by improving score on box and blocks test by at least 10 blocks bilaterally.    Baseline R-23 blocks    Time 8     Period Weeks    Status New      OT LONG TERM GOAL #3   Title Pt will demo improved coordination for ADLs as shown by improving time on 9-hole peg test by at least 15sec with RUE.    Baseline 70.57sec    Time 8    Period Weeks    Status New      OT LONG TERM GOAL #4   Title Pt will demo improved ease/decr bradykinesia for with dressing as shown by improving time on PPT#4 (donning/doffing jacket) by at least 8sec.    Baseline 35.75sec    Time 8    Period Weeks    Status New      OT LONG TERM GOAL #5   Title Pt will be able to use dominant RUE for bathing and eating at least 25% of the time.    Baseline only using LUE currently    Time 8    Period Weeks    Status New      Long Term Additional Goals   Additional Long Term Goals Yes      OT LONG TERM GOAL #6   Title Pt will verbalize understanding of ways to decr risk of future complications related to PD and appropriate community resources.    Baseline dependent    Time 8    Period Weeks    Status New                 Plan - 05/21/20 1407    Clinical Impression Statement Pt responded well to cueing for large amplitude movement strategies and demo improved movement quality with cueing for large amplitude.    OT Occupational Profile and History Detailed Assessment- Review of Records and additional review of physical, cognitive, psychosocial history related to current functional performance    Occupational performance deficits (Please refer to evaluation for details): ADL's;IADL's    Body Structure / Function / Physical Skills ADL;Balance;IADL;ROM;Dexterity;Improper spinal/pelvic alignment;Mobility;Flexibility;Tone;FMC;Coordination;Decreased knowledge of precautions;Decreased knowledge of use of DME;GMC;UE functional use    Rehab Potential Good    Clinical Decision Making Several treatment options, min-mod task modification necessary  Comorbidities Affecting Occupational Performance: May have comorbidities impacting  occupational performance    Modification or Assistance to Complete Evaluation  Min-Moderate modification of tasks or assist with assess necessary to complete eval    OT Frequency 2x / week    OT Duration 8 weeks   (or 15 visits +evaluation due to Medicaid visit limitations)   OT Treatment/Interventions Self-care/ADL training;Moist Heat;Fluidtherapy;DME and/or AE instruction;Splinting;Therapeutic activities;Aquatic Therapy;Therapeutic exercise;Passive range of motion;Functional Mobility Training;Neuromuscular education;Paraffin;Manual Therapy;Energy conservation;Patient/family education    Plan supine PWR! moves, continue with coordination activities with emphasize on large amplitude.    Consulted and Agree with Plan of Care Patient           Patient will benefit from skilled therapeutic intervention in order to improve the following deficits and impairments:   Body Structure / Function / Physical Skills: ADL, Balance, IADL, ROM, Dexterity, Improper spinal/pelvic alignment, Mobility, Flexibility, Tone, FMC, Coordination, Decreased knowledge of precautions, Decreased knowledge of use of DME, GMC, UE functional use       Visit Diagnosis: Other symptoms and signs involving the nervous system  Stiffness of right hand, not elsewhere classified  Tremor  Abnormal posture  Other symptoms and signs involving the musculoskeletal system  Stiffness of right elbow, not elsewhere classified  Other lack of coordination  Stiffness of right shoulder, not elsewhere classified  Unsteadiness on feet    Problem List Patient Active Problem List   Diagnosis Date Noted  . Hyperlipidemia 01/10/2020  . Parkinson's disease (HCC)   . Hypertension   . Screening for colorectal cancer   . Breast cancer screening     [facility] 05/21/2020, 2:19 PM  [facility] [address] [place], [place], [address] Phone: [phone]   Fax:   [phone]  Name: Maryfer Tauzin MRN: [id number] Date of Birth: May 06, 1966   Willa Frater, OTR/L [facility] [address] [place], [place]  [address] [phone] phone [phone] 05/21/20 2:19 PM

## 2020-05-21 NOTE — Patient Instructions (Signed)
Access Code: [email or web address] URL: [email or web address] Date: 05/21/2020 Prepared by: Sherlie Ban  Exercises Lateral Weight Shift with Arm Raise - 1-2 x daily - 5 x weekly - 2 sets - 10 reps Stride Stance Weight Shift - 1-2 x daily - 5 x weekly - 2 sets - 10 reps Sit to Stand - 1-2 x daily - 5 x weekly - 2 sets - 10 reps   Plus seated PWR up and PWR step

## 2020-05-21 NOTE — Patient Instructions (Addendum)
    PWR! Hand Exercises  Then, start with elbows bent and hands closed:   PWR! Hands: Push hands out BIG. Elbows straight, wrists up, fingers open and spread apart BIG. (Can also perform by pushing down on table, chair, knees. Push above head, out to the side, behind you, in front of you.)   PWR! Step: Touch index finger to thumb while keeping other fingers straight. Flick fingers out BIG (thumb out/straighten fingers). Repeat with other fingers. (Step your thumb to each finger).   With arms stretched out in front of you (elbows straight), perform the following:   PWR! Rock:  Move wrists up and down [employer]! Twist: Twist palms up and down BIG, hands flat.  "Like you are holding a tray"    ** Make each movement big and deliberate so that you feel the movement.  Perform at least 10 repetitions 1x/day, but perform PWR! Hands throughout the day when you are having trouble using your hands (picking up/manipulating small objects, writing, eating, typing, sewing, buttoning, etc.).    Coordination Exercises  Perform the following exercises for 20 minutes 1 times per day. Perform with both hand(s). Perform using big movements.   Flipping Cards: Place deck of cards on the table. Flip cards over by opening your hand big to grasp and then turn your palm up big, opening hand fully to release.  Deal cards: Hold 1/2 or whole deck in your hand. Use thumb to push card off top of deck with one big push.  Toss ball in the air and catch with the same hand: Toss big/high.  Deliberately open with toss and deliberately close hand after catch.  Pick up coins and place in coin bank or container: Pick up with big, intentional movements. Do not drag coin to the edge.  Place container out away from you so that you have to stretch elbow to put coin in.    Pick up coins and stack one at a time: Pick up with big, intentional movements. Do not drag coin to the edge. (5-10 in a stack)  Pick up 5-10  coins one at a time and hold in palm. Then, move coins from palm to fingertips one at time and place in coin bank/container.

## 2020-05-24 ENCOUNTER — Encounter: Payer: Self-pay | Admitting: Occupational Therapy

## 2020-05-24 ENCOUNTER — Ambulatory Visit: Payer: Medicaid Other | Admitting: Physical Therapy

## 2020-05-24 ENCOUNTER — Encounter: Payer: Self-pay | Admitting: Physical Therapy

## 2020-05-24 ENCOUNTER — Ambulatory Visit: Payer: Medicaid Other | Admitting: Occupational Therapy

## 2020-05-24 ENCOUNTER — Other Ambulatory Visit: Payer: Self-pay

## 2020-05-24 DIAGNOSIS — R29818 Other symptoms and signs involving the nervous system: Secondary | ICD-10-CM

## 2020-05-24 DIAGNOSIS — R29898 Other symptoms and signs involving the musculoskeletal system: Secondary | ICD-10-CM

## 2020-05-24 DIAGNOSIS — M25611 Stiffness of right shoulder, not elsewhere classified: Secondary | ICD-10-CM | POA: Diagnosis not present

## 2020-05-24 DIAGNOSIS — R2681 Unsteadiness on feet: Secondary | ICD-10-CM | POA: Diagnosis not present

## 2020-05-24 DIAGNOSIS — R251 Tremor, unspecified: Secondary | ICD-10-CM | POA: Diagnosis not present

## 2020-05-24 DIAGNOSIS — M6281 Muscle weakness (generalized): Secondary | ICD-10-CM

## 2020-05-24 DIAGNOSIS — M25621 Stiffness of right elbow, not elsewhere classified: Secondary | ICD-10-CM | POA: Diagnosis not present

## 2020-05-24 DIAGNOSIS — M25641 Stiffness of right hand, not elsewhere classified: Secondary | ICD-10-CM | POA: Diagnosis not present

## 2020-05-24 DIAGNOSIS — R278 Other lack of coordination: Secondary | ICD-10-CM

## 2020-05-24 DIAGNOSIS — R293 Abnormal posture: Secondary | ICD-10-CM | POA: Diagnosis not present

## 2020-05-24 DIAGNOSIS — R2689 Other abnormalities of gait and mobility: Secondary | ICD-10-CM | POA: Diagnosis not present

## 2020-05-24 NOTE — Therapy (Signed)
[facility] [address] [place], [place], [address] Phone: [phone]   Fax:  [phone]  Physical Therapy Treatment  Patient Details  Name: Teresa Logan MRN: [id number] Date of Birth: Jul 28, 1966 Referring Provider (PT): Tat, Lurena Joiner, DO   Encounter Date: 05/24/2020   PT End of Session - 05/24/20 1353    Visit Number 4    Number of Visits 13   initial 3 visits, plus additional 9 for 12 PT treatments total   Authorization Type Medicaid - 8 PT visits approved from 7/30-8/31    Authorization - Visit Number 3    Authorization - Number of Visits 8    PT Start Time 1316    PT Stop Time 1355   pt requesting to end slightly early to be here for her ride and to get her schedule adjusted for PT/OT   PT Time Calculation (min) 39 min    Equipment Utilized During Treatment Gait belt    Activity Tolerance Patient tolerated treatment well    Behavior During Therapy [facility] for tasks assessed/performed           Past Medical History:  Diagnosis Date  . HTN (hypertension)   . Kidney stones   . Parkinson's disease (HCC)   . Tremors of nervous system     Past Surgical History:  Procedure Laterality Date  . TONSILLECTOMY      There were no vitals filed for this visit.   Subjective Assessment - 05/24/20 1323    Subjective Nothing new since she was last here. Has not been able to try the new exercises at home yet.    Pertinent History PD (formally diagnosed in 2019), HTN    How long can you walk comfortably? in the grocery store when leaning on the cart - can go for 30 minutes.    Patient Stated Goals wants to get stronger and healthier, wants to establish a regular exercise routine    Currently in Pain? No/denies              [facility] PT Assessment - 05/24/20 1330      Timed Up and Go Test   Normal TUG (seconds) 11.78                         [facility] Adult PT Treatment/Exercise - 05/24/20 1330       Transfers   Transfers Sit to Stand;Stand to Sit    Sit to Stand 5: Supervision    Five time sit to stand comments  25.72 seconds with no UE support from mat table    Stand to Sit 5: Supervision;Without upper extremity assist;To chair/3-in-1      Therapeutic Activites    Therapeutic Activities Other Therapeutic Activities    Other Therapeutic Activities discussed POC and looked at pt's schedule for September and worked on making adjustments as necessary             Seated PWR moves:  PWR! Step for improved step initiation - 2 x 5 reps B, cues for incr foot clearance, step out and out and then step in and in    Pt performs PWR! Moves in standing:   PWR! Up for improved posture x10 reps, cues for big hands  PWR! Twist for improved trunk rotation performing in corner, reaching towards walls 2 x 5 reps, cues for wide BOS, big hands, and to pivot feet when turning   PWR! Step for improved step initiation -  standing at the countertop with single UE support, x5 reps B, cues for incr foot clearance and big hands when stepping.   Cues provided for technique and incr intensity of movement.      Reviewed previous HEP addition from last session: Stride Stance Weight Shift - 1-2 x daily - 5 x weekly - 2 sets - 10 reps- with single UE support at counter, opposite UE lift with cues for posture and weight shift and big hands and big reaching when shifting forwards performed x10 reps B    PT Education - 05/24/20 1346    Education Details reviewed new HEP additions    Person(s) Educated Patient    Methods Explanation;Demonstration    Comprehension Verbalized understanding;Returned demonstration            PT Short Term Goals - 05/24/20 1333      PT SHORT TERM GOAL #1   Title Pt will be independent with initial HEP to build upon functional gains made in therapy. ALL STGS DUE AFTER 3 VISITS.    Baseline currently dependent.    Time 3    Period --   visits   Status New      PT SHORT  TERM GOAL #2   Title Pt will finish assessment of miniBEST - LTG to be written as appropriate to determine fall risk.    Baseline pt scoring a 16/28 - LTG written as appropriate.    Time 3   visits   Status Achieved      PT SHORT TERM GOAL #3   Title Pt will decr 5x sit <> stand time to 27 seconds or less without UE support in order to demo improved BLE strength.    Baseline 31.38 seconds, 25.72 seconds with no UE support from mat table on 05/24/20    Time 3   visits   Status Achieved      PT SHORT TERM GOAL #4   Title Pt will decr TUG time with no AD to 14.5 seconds or less in order to demo decr fall risk.    Baseline 16.47 seconds, 11.78 on 05/24/20    Time 3   visits   Status Achieved             PT Long Term Goals - 04/30/20 1433      PT LONG TERM GOAL #1   Title Pt will be independent with final HEP to build upon functional gains made in therapy. ALL LTGS DUE AFTER 12 VISITS.Marland Kitchen    Baseline currently dependent    Time 12    Period --   visits   Status New      PT LONG TERM GOAL #2   Title Pt will improve miniBEST to at least a 20/28 in order to decr fall risk.    Baseline 16/28 on 04/30/20    Time 12   visits   Status Revised      PT LONG TERM GOAL #3   Title Pt will improve gait speed to at least 2.2 ft/sec in order to demo improved gait efficiency and decr fall risk.    Baseline 19.6 seconds = 1.67 ft/sec    Time 12   visits   Status New      PT LONG TERM GOAL #4   Title Pt will decr cog TUG to 22 seconds or less in order to demo improved dual tasking with gait.    Baseline 28.78 seconds    Time 12   visits  Status New      PT LONG TERM GOAL #5   Title Pt will decr 5x sit <> stand time to 22 seconds or less without UE support in order to demo improved BLE strength.    Baseline 31.38 seconds    Time 12    Period --   visits   Status New      PT LONG TERM GOAL #6   Title Pt will verbalize understanding of local PD community resources.    Baseline currently  dependent.    Time 12   visits   Status New                 Plan - 05/24/20 1357    Clinical Impression Statement Began to assess pt's STGs - with pt meeting STG #3 and 4 in regards to 5x sit <> stand and TUG. Pt able to perform TUG in 11.78 seconds today, indicating a decreased risk of falls. Remainder of session focused on reviewing new additions to pt's previous HEP and modified standing PWR moves in corner. Pt needing cues for incr intensity of movement and BIG hands when performing. Pt fatigues easily and needed intermittent seated rest breaks throughout session.    Personal Factors and Comorbidities Comorbidity 2    Comorbidities PD, HTN    Examination-Activity Limitations Locomotion Level;Stand;Stairs;Squat;Hygiene/Grooming;Dressing;Transfers;Caring for Others    Examination-Participation Restrictions Community Activity;Cleaning;Meal Prep;Shop    Stability/Clinical Decision Making Evolving/Moderate complexity    Rehab Potential Good    PT Frequency 1x / week   plus additional 2x week for 4 weeks   PT Duration 3 weeks   plus additional 2x week for 4 weeks   PT Treatment/Interventions ADLs/Self Care Home Management;DME Instruction;Gait training;Stair training;Balance training;Therapeutic exercise;Therapeutic activities;Functional mobility training;Neuromuscular re-education;Patient/family education;Energy conservation;Vestibular    PT Next Visit Plan how is HEP? will need medicaid re-auth, modified standing PWR moves, seated PWR moves, gait training (with walking poles), standing balance for SLS and incr R foot clearance.    Consulted and Agree with Plan of Care Patient           Patient will benefit from skilled therapeutic intervention in order to improve the following deficits and impairments:  Abnormal gait, Decreased activity tolerance, Decreased balance, Decreased coordination, Decreased endurance, Decreased strength, Difficulty walking, Impaired tone, Postural  dysfunction  Visit Diagnosis: Other symptoms and signs involving the nervous system  Other symptoms and signs involving the musculoskeletal system  Abnormal posture  Other lack of coordination  Muscle weakness (generalized)     Problem List Patient Active Problem List   Diagnosis Date Noted  . Hyperlipidemia 01/10/2020  . Parkinson's disease (HCC)   . Hypertension   . Screening for colorectal cancer   . Breast cancer screening     Drake Leach, PT, DPT  05/24/2020, 1:59 PM  [facility] [address] [place], [place], [address] Phone: [phone]   Fax:  [phone]  Name: Teresa Logan MRN: [id number] Date of Birth: 03/29/1966

## 2020-05-24 NOTE — Therapy (Signed)
[facility] [address] [place], [place], [address] Phone: [phone]   Fax:  [phone]  Occupational Therapy Treatment  Patient Details  Name: Teresa Logan MRN: [id number] Date of Birth: January 06, 1966 Referring Provider (OT): Dr. Lurena Joiner Tat    Encounter Date: 05/24/2020   OT End of Session - 05/24/20 1238    Visit Number 3    Number of Visits 16    Date for OT Re-Evaluation 06/07/20    Authorization Type BCBS medicaid - 15 visits    OT Start Time 1235    OT Stop Time 1315    OT Time Calculation (min) 40 min    Activity Tolerance Patient tolerated treatment well    Behavior During Therapy [facility] for tasks assessed/performed   tearful/emotional          Past Medical History:  Diagnosis Date  . HTN (hypertension)   . Kidney stones   . Parkinson's disease (HCC)   . Tremors of nervous system     Past Surgical History:  Procedure Laterality Date  . TONSILLECTOMY      There were no vitals filed for this visit.   Subjective Assessment - 05/24/20 1237    Currently in Pain? No/denies                 Treatment:  Patient completed PWR! Moves basic 4 in supine for large amplitude movements with min verbal cues. PWR! Hands basic 4 with minimal verbal cues. Card flipping, dealing with thumbs, bilaterally min vc for technique and large amplitude movements. Tremors increased with RUE with exertion with dealing with thumb . Dynamic step and reach to right side, min v.c for larger step and finger extension.               OT Education - 05/24/20 1253    Education Details PWR! moves basic 4 in supine, 10 reps each, min v.c and demonstration. PWR! hands basic 4 min vc and demonstration.    Person(s) Educated Patient    Methods Explanation;Demonstration;Handout;Verbal cues    Comprehension Returned demonstration;Verbalized understanding;Verbal cues required            OT Short Term Goals -  04/08/20 1220      OT SHORT TERM GOAL #1   Title Pt will be independent with PD-specific HEP.--check STGs 05/09/20    Baseline no HEP    Time 4    Period Weeks    Status New      OT SHORT TERM GOAL #2   Title Pt will be independent with R hand splint wear/care for improved R hand positioning, decr risk of contracture, and manage rigidity.    Baseline no splint, pt with decr MP ext and thumb abduction and moderate rigidity    Time 4    Period Weeks    Status New      OT SHORT TERM GOAL #3   Title Pt will improve BUE coordination/functional reaching for ADLs as shown by improving score on box and blocks test by at least 5 blocks bilaterally.    Baseline R-23, L-34 blocks    Time 4    Period Weeks    Status New      OT SHORT TERM GOAL #4   Title Pt will demo ability to retrieve light object from overhead shelf using at least 135* R shoulder flex with elbow ext -35* or greater.    Baseline 130* shoulder flex with -50* elbow ext.    Time  4    Period Weeks    Status New      OT SHORT TERM GOAL #5   Title Pt will demo improved coordination for ADLs as shown by improving time on 9-hole peg test by at least 8sec bilaterally.    Baseline R-70.57sec, L-52.12sec    Time 4    Period Weeks    Status New      Additional Short Term Goals   Additional Short Term Goals --      OT SHORT TERM GOAL #6   Title Pt will be able to do simple maintenance/meal prep tasks mod I.    Baseline children performing    Time 8    Period Weeks    Status New             OT Long Term Goals - 04/08/20 1359      OT LONG TERM GOAL #1   Title Pt will verbalize understanding of adaptive strategies to incr ease, safety, and independence/participation with ADLs/IADLs.--check LTGs 06/09/20    Baseline dependent    Time 8    Period Weeks    Status New      OT LONG TERM GOAL #2   Title Pt will improve RUE coordination/functional reaching for ADLs as shown by improving score on box and blocks test by at  least 10 blocks bilaterally.    Baseline R-23 blocks    Time 8    Period Weeks    Status New      OT LONG TERM GOAL #3   Title Pt will demo improved coordination for ADLs as shown by improving time on 9-hole peg test by at least 15sec with RUE.    Baseline 70.57sec    Time 8    Period Weeks    Status New      OT LONG TERM GOAL #4   Title Pt will demo improved ease/decr bradykinesia for with dressing as shown by improving time on PPT#4 (donning/doffing jacket) by at least 8sec.    Baseline 35.75sec    Time 8    Period Weeks    Status New      OT LONG TERM GOAL #5   Title Pt will be able to use dominant RUE for bathing and eating at least 25% of the time.    Baseline only using LUE currently    Time 8    Period Weeks    Status New      Long Term Additional Goals   Additional Long Term Goals Yes      OT LONG TERM GOAL #6   Title Pt will verbalize understanding of ways to decr risk of future complications related to PD and appropriate community resources.    Baseline dependent    Time 8    Period Weeks    Status New                 Plan - 05/24/20 1240    Clinical Impression Statement Pt is progressing towards goals. She demonstrates understanding of PWR! moves in supine.    OT Occupational Profile and History Detailed Assessment- Review of Records and additional review of physical, cognitive, psychosocial history related to current functional performance    Occupational performance deficits (Please refer to evaluation for details): ADL's;IADL's    Body Structure / Function / Physical Skills ADL;Balance;IADL;ROM;Dexterity;Improper spinal/pelvic alignment;Mobility;Flexibility;Tone;FMC;Coordination;Decreased knowledge of precautions;Decreased knowledge of use of DME;GMC;UE functional use    Rehab Potential Good    Clinical Decision  Making Several treatment options, min-mod task modification necessary    Comorbidities Affecting Occupational Performance: May have  comorbidities impacting occupational performance    Modification or Assistance to Complete Evaluation  Min-Moderate modification of tasks or assist with assess necessary to complete eval    OT Frequency 2x / week    OT Duration 8 weeks   (or 15 visits +evaluation due to Medicaid visit limitations)   OT Treatment/Interventions Self-care/ADL training;Moist Heat;Fluidtherapy;DME and/or AE instruction;Splinting;Therapeutic activities;Aquatic Therapy;Therapeutic exercise;Passive range of motion;Functional Mobility Training;Neuromuscular education;Paraffin;Manual Therapy;Energy conservation;Patient/family education    Plan reveiw supine PWR! moves prn, continue with coordination activities / functional activities with emphasize on large amplitude.    Consulted and Agree with Plan of Care Patient           Patient will benefit from skilled therapeutic intervention in order to improve the following deficits and impairments:   Body Structure / Function / Physical Skills: ADL, Balance, IADL, ROM, Dexterity, Improper spinal/pelvic alignment, Mobility, Flexibility, Tone, FMC, Coordination, Decreased knowledge of precautions, Decreased knowledge of use of DME, GMC, UE functional use       Visit Diagnosis: Other symptoms and signs involving the nervous system  Stiffness of right hand, not elsewhere classified  Other symptoms and signs involving the musculoskeletal system  Stiffness of right elbow, not elsewhere classified  Other lack of coordination  Stiffness of right shoulder, not elsewhere classified  Muscle weakness (generalized)  Tremor    Problem List Patient Active Problem List   Diagnosis Date Noted  . Hyperlipidemia 01/10/2020  . Parkinson's disease (HCC)   . Hypertension   . Screening for colorectal cancer   . Breast cancer screening     Ladrea Holladay 05/24/2020, 1:03 PM  [facility] [address] [place], [place], [address] Phone: [phone]   Fax:  [phone]  Name: Teresa Logan MRN: [id number] Date of Birth: [birth year]

## 2020-05-28 ENCOUNTER — Other Ambulatory Visit: Payer: Self-pay

## 2020-05-28 ENCOUNTER — Ambulatory Visit: Payer: Medicaid Other | Admitting: Occupational Therapy

## 2020-05-28 ENCOUNTER — Ambulatory Visit: Payer: Medicaid Other | Admitting: Physical Therapy

## 2020-05-28 ENCOUNTER — Encounter: Payer: Self-pay | Admitting: Occupational Therapy

## 2020-05-28 DIAGNOSIS — R251 Tremor, unspecified: Secondary | ICD-10-CM | POA: Diagnosis not present

## 2020-05-28 DIAGNOSIS — R2689 Other abnormalities of gait and mobility: Secondary | ICD-10-CM | POA: Diagnosis not present

## 2020-05-28 DIAGNOSIS — R293 Abnormal posture: Secondary | ICD-10-CM | POA: Diagnosis not present

## 2020-05-28 DIAGNOSIS — R2681 Unsteadiness on feet: Secondary | ICD-10-CM | POA: Diagnosis not present

## 2020-05-28 DIAGNOSIS — R278 Other lack of coordination: Secondary | ICD-10-CM | POA: Diagnosis not present

## 2020-05-28 DIAGNOSIS — M25641 Stiffness of right hand, not elsewhere classified: Secondary | ICD-10-CM

## 2020-05-28 DIAGNOSIS — M25621 Stiffness of right elbow, not elsewhere classified: Secondary | ICD-10-CM | POA: Diagnosis not present

## 2020-05-28 DIAGNOSIS — R29898 Other symptoms and signs involving the musculoskeletal system: Secondary | ICD-10-CM

## 2020-05-28 DIAGNOSIS — R29818 Other symptoms and signs involving the nervous system: Secondary | ICD-10-CM | POA: Diagnosis not present

## 2020-05-28 DIAGNOSIS — M6281 Muscle weakness (generalized): Secondary | ICD-10-CM

## 2020-05-28 DIAGNOSIS — M25611 Stiffness of right shoulder, not elsewhere classified: Secondary | ICD-10-CM | POA: Diagnosis not present

## 2020-05-28 NOTE — Therapy (Signed)
[facility] [address] [place], [place], [address] Phone: [phone]   Fax:  [phone]  Physical Therapy Treatment/Medicaid Re-auth  Patient Details  Name: Teresa Logan MRN: [id number] Date of Birth: 1966-01-28 Referring Provider (PT): Alonza Bogus, DO   Encounter Date: 05/28/2020   PT End of Session - 05/28/20 1402    Visit Number 5    Number of Visits 13   initial 3 visits, plus additional 9 for 12 PT treatments total   Authorization Type Medicaid - 8 PT visits approved from 7/30-8/31    Authorization - Visit Number 4    Authorization - Number of Visits 8    PT Start Time 7741    PT Stop Time 1358    PT Time Calculation (min) 40 min    Equipment Utilized During Treatment Gait belt    Activity Tolerance Patient tolerated treatment well    Behavior During Therapy [facility] for tasks assessed/performed           Past Medical History:  Diagnosis Date  . HTN (hypertension)   . Kidney stones   . Parkinson's disease ([place])   . Tremors of nervous system     Past Surgical History:  Procedure Laterality Date  . TONSILLECTOMY      There were no vitals filed for this visit.   Subjective Assessment - 05/28/20 1324    Subjective No changes since the last time she was here. Has done some of the exercises at home.    Pertinent History PD (formally diagnosed in 2019), HTN    How long can you walk comfortably? in the grocery store when leaning on the cart - can go for 30 minutes.    Patient Stated Goals wants to get stronger and healthier, wants to establish a regular exercise routine    Currently in Pain? Yes   "general aches and pains"                          Pt performs PWR! Moves in standing position:   PWR! Up for improved posture 2 x 10 reps, initial demo cues for technique    Pt performs PWR! Moves in sitting position     PWR! Rock for improved weightshifting x5 reps B with weight  shifting over onto thigh with pt's elbow and reaching overhead and then performed an additional 5 reps with kicking the leg straight out  PWR! Twist for improved trunk rotation 2 x 5 reps B, cues for big twist and clap, resetting with tall posture in the middle before twisting to opposite side    Cues provided for technique and intensity. Rest breaks taken as needed.      [facility] Adult PT Treatment/Exercise - 05/28/20 0001      Ambulation/Gait   Ambulation/Gait Yes    Ambulation/Gait Assistance 5: Supervision    Ambulation/Gait Assistance Details [place] of walking poles for 2 laps with therapist helping to facilitate arm swing and trunk rotation (pt with incr rigidity with RUE), additional gait performed without use of walking poles with pt demonstrating improved LUE arm swing, but continued with little to arm swing (with therapist helping to facilitate through pt's R arm). pt with good carryover with cues for heel strike and incr step length B     Ambulation Distance (Feet) 345 Feet    Assistive device None   therapist facilitating with B walking poles   Gait Pattern Step-through pattern;Decreased arm swing -  right;Decreased arm swing - left;Decreased stride length;Decreased hip/knee flexion - right;Right foot flat;Left foot flat;Poor foot clearance - right;Trunk flexed    Ambulation Surface Level;Indoor               Balance Exercises - 05/28/20 0001      Balance Exercises: Standing   Marching Forwards;Solid surface;Limitations    Marching Limitations down and back at longer countertop 2x, cues for slowed and controlled and incr hip/knee flexion, need for single UE/fingertip support    Other Standing Exercises Comments standing with BUE support at counter: x10 reps heel toe raises             PT Education - 05/28/20 1401    Education Details getting scheduled for additional visit (therapist found additional appt on schedule and blocked spot), importance of performing HEP  consistently at home    Person(s) Educated Patient    Methods Explanation    Comprehension Verbalized understanding            PT Short Term Goals - 05/28/20 1325      PT SHORT TERM GOAL #1   Title Pt will be independent with initial HEP to build upon functional gains made in therapy. ALL STGS DUE AFTER 3 VISITS.    Baseline pt reports not consistently performing exercises.    Time 3    Period --   visits   Status Partially Met      PT SHORT TERM GOAL #2   Title Pt will finish assessment of miniBEST - LTG to be written as appropriate to determine fall risk.    Baseline pt scoring a 16/28 - LTG written as appropriate.    Time 3   visits   Status Achieved      PT SHORT TERM GOAL #3   Title Pt will decr 5x sit <> stand time to 27 seconds or less without UE support in order to demo improved BLE strength.    Baseline 31.38 seconds, 25.72 seconds with no UE support from mat table on 05/24/20    Time 3   visits   Status Achieved      PT SHORT TERM GOAL #4   Title Pt will decr TUG time with no AD to 14.5 seconds or less in order to demo decr fall risk.    Baseline 16.47 seconds, 11.78 on 05/24/20    Time 3   visits   Status Achieved             PT Long Term Goals - 05/28/20 1549      PT LONG TERM GOAL #1   Title Pt will be independent with final HEP to build upon functional gains made in therapy. ALL LTGS DUE AFTER 13 VISITS.Marland Kitchen    Baseline currently dependent    Time 13    Period --   visits   Status New      PT LONG TERM GOAL #2   Title Pt will improve miniBEST to at least a 20/28 in order to decr fall risk.    Baseline 16/28 on 04/30/20    Time 13   visits   Status Revised      PT LONG TERM GOAL #3   Title Pt will improve gait speed to at least 2.2 ft/sec in order to demo improved gait efficiency and decr fall risk.    Baseline 19.6 seconds = 1.67 ft/sec    Time 13   visits   Status New  PT LONG TERM GOAL #4   Title Pt will decr cog TUG to 22 seconds or less in  order to demo improved dual tasking with gait.    Baseline 28.78 seconds    Time 13   visits   Status New      PT LONG TERM GOAL #5   Title Pt will decr 5x sit <> stand time to 22 seconds or less without UE support in order to demo improved BLE strength.    Baseline 31.38 seconds    Time 13    Period --   visits   Status New      PT LONG TERM GOAL #6   Title Pt will verbalize understanding of local PD community resources.    Baseline currently dependent.    Time 13   visits   Status New          Revised/On-going LTGs for Medicaid re-auth:     PT Long Term Goals - 05/28/20 1549      PT LONG TERM GOAL #1   Title Pt will be independent with final HEP to build upon functional gains made in therapy. ALL LTGS DUE AFTER 13 VISITS.Marland Kitchen    Baseline currently dependent    Time 13    Period --   visits   Status New      PT LONG TERM GOAL #2   Title Pt will improve miniBEST to at least a 20/28 in order to decr fall risk.    Baseline 16/28 on 04/30/20    Time 13   visits   Status Revised      PT LONG TERM GOAL #3   Title Pt will improve gait speed to at least 2.2 ft/sec in order to demo improved gait efficiency and decr fall risk.    Baseline 19.6 seconds = 1.67 ft/sec    Time 13   visits   Status New      PT LONG TERM GOAL #4   Title Pt will decr cog TUG to 22 seconds or less in order to demo improved dual tasking with gait.    Baseline 28.78 seconds    Time 13   visits   Status New      PT LONG TERM GOAL #5   Title Pt will decr 5x sit <> stand time to 22 seconds or less without UE support in order to demo improved BLE strength.    Baseline 31.38 seconds    Time 13    Period --   visits   Status New      PT LONG TERM GOAL #6   Title Pt will verbalize understanding of local PD community resources.    Baseline currently dependent.    Time 13   visits   Status New               Plan - 05/28/20 1542    Clinical Impression Statement Focus of today's skilled session  was performing some seated and standing PWR moves, gait training with walking poles, and standing balance. Pt needing intermittent seated rest breaks due to fatigue. Pt continues with decr R arm swing after gait training when walking poles removed. Goals checked last session with pt meeting STGs #3 and #4 in regards to 5x sit <> stand and TUG, will request re-auth for 1x week for 8 weeks. Initial plan written for 2x week for 4 weeks, however pt only able to come 1x week, so adjusted POC to 1x week for  8 weeks.    Personal Factors and Comorbidities Comorbidity 2    Comorbidities PD, HTN    Examination-Activity Limitations Locomotion Level;Stand;Stairs;Squat;Hygiene/Grooming;Dressing;Transfers;Caring for Others    Examination-Participation Restrictions Community Activity;Cleaning;Meal Prep;Shop    Stability/Clinical Decision Making Evolving/Moderate complexity    Rehab Potential Good    PT Frequency 1x / week    PT Duration 8 weeks    PT Treatment/Interventions ADLs/Self Care Home Management;DME Instruction;Gait training;Stair training;Balance training;Therapeutic exercise;Therapeutic activities;Functional mobility training;Neuromuscular re-education;Patient/family education;Energy conservation;Vestibular    PT Next Visit Plan how is HEP going? initiate walking program, modified standing PWR moves, seated PWR moves, gait training (with walking poles), standing balance for SLS and incr R foot clearance.    Consulted and Agree with Plan of Care Patient           Patient will benefit from skilled therapeutic intervention in order to improve the following deficits and impairments:  Abnormal gait, Decreased activity tolerance, Decreased balance, Decreased coordination, Decreased endurance, Decreased strength, Difficulty walking, Impaired tone, Postural dysfunction  Visit Diagnosis: Other symptoms and signs involving the nervous system  Other symptoms and signs involving the musculoskeletal  system  Other lack of coordination  Muscle weakness (generalized)  Abnormal posture     Problem List Patient Active Problem List   Diagnosis Date Noted  . Hyperlipidemia 01/10/2020  . Parkinson's disease ([place])   . Hypertension   . Screening for colorectal cancer   . Breast cancer screening     Arliss Journey, PT, DPT  05/28/2020, 3:50 PM  [facility] [address] [place], [place], [address] Phone: [phone]   Fax:  [phone]  Name: Camary Sosa MRN: [id number] Date of Birth: Feb 04, 1966

## 2020-05-28 NOTE — Therapy (Signed)
[facility] [address] [place], [place], [address] Phone: [phone]   Fax:  [phone]  Occupational Therapy Treatment  Patient Details  Name: Teresa Logan MRN: [id number] Date of Birth: [birth year] Referring Provider (OT): Dr. Lurena Joiner Tat    Encounter Date: 05/28/2020   OT End of Session - 05/28/20 1235    Visit Number 4    Number of Visits 16    Date for OT Re-Evaluation 06/07/20    Authorization Type [employer] medicaid - 15 visits    OT Start Time 1234    OT Stop Time 1315    OT Time Calculation (min) 41 min    Activity Tolerance Patient tolerated treatment well    Behavior During Therapy [facility] for tasks assessed/performed   tearful/emotional          Past Medical History:  Diagnosis Date  . HTN (hypertension)   . Kidney stones   . Parkinson's disease (HCC)   . Tremors of nervous system     Past Surgical History:  Procedure Laterality Date  . TONSILLECTOMY      There were no vitals filed for this visit.   Subjective Assessment - 05/28/20 1234    Subjective  pt reports that she hasn't tried supine PWR! moves yet at home due to getting new bed, has noticed that hand exercises have really helped    Pertinent History Young Onset Parkinson's disease.  PMH:  HTN, HLD, ?hx of essential tremor diagnosis    Patient Stated Goals increase strength and use of R hand    Currently in Pain? No/denies               PWR! Moves (basic 4) in supine x 20 each with min cues For incr movement amplitude.    Began education for ADL strategies:  Practiced opening/closing bottles/jar with lid with min-mod cueing for large amplitude movement strategies, simulated cutting food with fork/knife with large amplitude movement strategies with min cueing, picking up cups/bottles with large amplitude movements with min cueing.  Also discussed/recommended adaptive cutting board, anti-cut gloves, and choppers prn.  Pt instructed  in strategies for eating/drinking (large amplitude and tremor compensation).          OT Education - 05/28/20 1447    Education Details began education regarding large amplitude movement strategies for ADLs    Person(s) Educated Patient    Methods Explanation;Demonstration;Verbal cues    Comprehension Returned demonstration;Verbal cues required;Verbalized understanding            OT Short Term Goals - 04/08/20 1220      OT SHORT TERM GOAL #1   Title Pt will be independent with PD-specific HEP.--check STGs 05/09/20    Baseline no HEP    Time 4    Period Weeks    Status New      OT SHORT TERM GOAL #2   Title Pt will be independent with R hand splint wear/care for improved R hand positioning, decr risk of contracture, and manage rigidity.    Baseline no splint, pt with decr MP ext and thumb abduction and moderate rigidity    Time 4    Period Weeks    Status New      OT SHORT TERM GOAL #3   Title Pt will improve BUE coordination/functional reaching for ADLs as shown by improving score on box and blocks test by at least 5 blocks bilaterally.    Baseline R-23, L-34 blocks    Time 4  Period Weeks    Status New      OT SHORT TERM GOAL #4   Title Pt will demo ability to retrieve light object from overhead shelf using at least 135* R shoulder flex with elbow ext -35* or greater.    Baseline 130* shoulder flex with -50* elbow ext.    Time 4    Period Weeks    Status New      OT SHORT TERM GOAL #5   Title Pt will demo improved coordination for ADLs as shown by improving time on 9-hole peg test by at least 8sec bilaterally.    Baseline R-70.57sec, L-52.12sec    Time 4    Period Weeks    Status New      Additional Short Term Goals   Additional Short Term Goals --      OT SHORT TERM GOAL #6   Title Pt will be able to do simple maintenance/meal prep tasks mod I.    Baseline children performing    Time 8    Period Weeks    Status New             OT Long Term Goals  - 04/08/20 1359      OT LONG TERM GOAL #1   Title Pt will verbalize understanding of adaptive strategies to incr ease, safety, and independence/participation with ADLs/IADLs.--check LTGs 06/09/20    Baseline dependent    Time 8    Period Weeks    Status New      OT LONG TERM GOAL #2   Title Pt will improve RUE coordination/functional reaching for ADLs as shown by improving score on box and blocks test by at least 10 blocks bilaterally.    Baseline R-23 blocks    Time 8    Period Weeks    Status New      OT LONG TERM GOAL #3   Title Pt will demo improved coordination for ADLs as shown by improving time on 9-hole peg test by at least 15sec with RUE.    Baseline 70.57sec    Time 8    Period Weeks    Status New      OT LONG TERM GOAL #4   Title Pt will demo improved ease/decr bradykinesia for with dressing as shown by improving time on PPT#4 (donning/doffing jacket) by at least 8sec.    Baseline 35.75sec    Time 8    Period Weeks    Status New      OT LONG TERM GOAL #5   Title Pt will be able to use dominant RUE for bathing and eating at least 25% of the time.    Baseline only using LUE currently    Time 8    Period Weeks    Status New      Long Term Additional Goals   Additional Long Term Goals Yes      OT LONG TERM GOAL #6   Title Pt will verbalize understanding of ways to decr risk of future complications related to PD and appropriate community resources.    Baseline dependent    Time 8    Period Weeks    Status New                 Plan - 05/28/20 1448    Clinical Impression Statement Pt is progressing towards goals with improved use of RUE and decr rigidity. Pt will continue to need reinforcement for incr RUE functional use.  OT Occupational Profile and History Detailed Assessment- Review of Records and additional review of physical, cognitive, psychosocial history related to current functional performance    Occupational performance deficits (Please refer  to evaluation for details): ADL's;IADL's    Body Structure / Function / Physical Skills ADL;Balance;IADL;ROM;Dexterity;Improper spinal/pelvic alignment;Mobility;Flexibility;Tone;FMC;Coordination;Decreased knowledge of precautions;Decreased knowledge of use of DME;GMC;UE functional use    Rehab Potential Good    Clinical Decision Making Several treatment options, min-mod task modification necessary    Comorbidities Affecting Occupational Performance: May have comorbidities impacting occupational performance    Modification or Assistance to Complete Evaluation  Min-Moderate modification of tasks or assist with assess necessary to complete eval    OT Frequency 2x / week    OT Duration 8 weeks   (or 15 visits +evaluation due to Medicaid visit limitations)   OT Treatment/Interventions Self-care/ADL training;Moist Heat;Fluidtherapy;DME and/or AE instruction;Splinting;Therapeutic activities;Aquatic Therapy;Therapeutic exercise;Passive range of motion;Functional Mobility Training;Neuromuscular education;Paraffin;Manual Therapy;Energy conservation;Patient/family education    Plan large amplitude movement strategies for ADLs (donning pants), bag exercises    Consulted and Agree with Plan of Care Patient           Patient will benefit from skilled therapeutic intervention in order to improve the following deficits and impairments:   Body Structure / Function / Physical Skills: ADL, Balance, IADL, ROM, Dexterity, Improper spinal/pelvic alignment, Mobility, Flexibility, Tone, FMC, Coordination, Decreased knowledge of precautions, Decreased knowledge of use of DME, GMC, UE functional use       Visit Diagnosis: Other symptoms and signs involving the nervous system  Stiffness of right hand, not elsewhere classified  Other symptoms and signs involving the musculoskeletal system  Stiffness of right elbow, not elsewhere classified  Other lack of coordination  Stiffness of right shoulder, not elsewhere  classified  Tremor  Abnormal posture  Unsteadiness on feet  Other abnormalities of gait and mobility    Problem List Patient Active Problem List   Diagnosis Date Noted  . Hyperlipidemia 01/10/2020  . Parkinson's disease (HCC)   . Hypertension   . Screening for colorectal cancer   . Breast cancer screening     [facility] 05/28/2020, 3:02 PM  [facility] [address] [place], [place], [address] Phone: [phone]   Fax:  [phone]  Name: Teresa Logan MRN: [id number] Date of Birth: 05/10/1966   Willa Frater, OTR/L [facility] [address] [place], [place]  [address] [phone] phone [phone] 05/28/20 3:02 PM

## 2020-05-28 NOTE — Progress Notes (Signed)
Pt scheduled and n/s visit

## 2020-05-30 ENCOUNTER — Other Ambulatory Visit: Payer: Self-pay

## 2020-05-30 ENCOUNTER — Encounter: Payer: Self-pay | Admitting: Neurology

## 2020-05-30 ENCOUNTER — Encounter (INDEPENDENT_AMBULATORY_CARE_PROVIDER_SITE_OTHER): Payer: Medicaid Other | Admitting: Neurology

## 2020-06-04 ENCOUNTER — Other Ambulatory Visit: Payer: Self-pay | Admitting: Internal Medicine

## 2020-06-04 DIAGNOSIS — I10 Essential (primary) hypertension: Secondary | ICD-10-CM

## 2020-06-05 ENCOUNTER — Ambulatory Visit: Payer: Medicaid Other | Admitting: Occupational Therapy

## 2020-06-06 ENCOUNTER — Encounter: Payer: Medicaid Other | Admitting: Internal Medicine

## 2020-06-07 ENCOUNTER — Encounter: Payer: Medicaid Other | Admitting: Occupational Therapy

## 2020-06-10 ENCOUNTER — Encounter: Payer: Medicaid Other | Admitting: Internal Medicine

## 2020-06-11 ENCOUNTER — Ambulatory Visit: Payer: Medicaid Other | Attending: Neurology | Admitting: Occupational Therapy

## 2020-06-12 ENCOUNTER — Ambulatory Visit: Payer: Medicaid Other | Admitting: Physical Therapy

## 2020-06-13 ENCOUNTER — Ambulatory Visit: Payer: Medicaid Other | Admitting: Occupational Therapy

## 2020-06-19 ENCOUNTER — Ambulatory Visit: Payer: Medicaid Other | Admitting: Physical Therapy

## 2020-06-19 ENCOUNTER — Ambulatory Visit: Payer: Medicaid Other | Admitting: Occupational Therapy

## 2020-06-20 ENCOUNTER — Telehealth: Payer: Self-pay | Admitting: Internal Medicine

## 2020-06-20 ENCOUNTER — Other Ambulatory Visit: Payer: Self-pay

## 2020-06-20 DIAGNOSIS — I10 Essential (primary) hypertension: Secondary | ICD-10-CM

## 2020-06-20 MED ORDER — LOSARTAN POTASSIUM 25 MG PO TABS: ORAL_TABLET | ORAL | 0 refills | Status: DC

## 2020-06-20 NOTE — Telephone Encounter (Signed)
losartan (COZAAR) 25 MG tablet  hydrochlorothiazide (HYDRODIURIL) 25 MG tablet, refill request @  [facility] #[id number] [place] Otto, [place] - [address] Phone:  [phone]  Fax:  [phone]

## 2020-06-20 NOTE — Telephone Encounter (Signed)
HCTZ was sent on 06/04/20 w/ 1 RF. Will forward RX request for Losartan to [facility] team. SChaplin, RN,BSN

## 2020-06-20 NOTE — Telephone Encounter (Signed)
Patient contacted the on call pager regarding her recent positive COVID test. Patient states that she has been feeling poorly for the past two weeks. She states that she has had the following symptoms - nausea, mild emesis, fatigue, and non productive cough. She states that she has had a difficult time keeping food down intimally, but has been able to tolerate small amounts of food and broth daily. She denies shortness of breath or chest pain, She has not had any emesis for almost a week. Patient states that she is concerned about her new diagnosis of COVID. .   I counseled her regarding her symptoms and recommended conservative therapy with over the counter tylenol, nasal steroid, and antihistamines for symptoms control. I told her that I would reach out to her PCP and to the [facility] infusion center to see if she would qualify. I counseled her on the sign and sx that are important to monitor and to call our clinic go to the hospital if her symptoms worsen. Patient admitted to understanding.  Plan:  - Conservative management - Contacted [facility] clinic for possible infusion  - No emergent need for admission.

## 2020-06-21 ENCOUNTER — Encounter: Payer: Medicaid Other | Admitting: Occupational Therapy

## 2020-06-24 ENCOUNTER — Encounter: Payer: Medicaid Other | Admitting: Occupational Therapy

## 2020-06-27 ENCOUNTER — Ambulatory Visit: Payer: Medicaid Other | Admitting: Physical Therapy

## 2020-06-27 ENCOUNTER — Ambulatory Visit: Payer: Medicaid Other | Admitting: Occupational Therapy

## 2020-07-02 ENCOUNTER — Ambulatory Visit: Payer: Medicaid Other | Admitting: Physical Therapy

## 2020-07-02 ENCOUNTER — Ambulatory Visit: Payer: Medicaid Other | Admitting: Occupational Therapy

## 2020-07-09 ENCOUNTER — Ambulatory Visit: Payer: Medicaid Other | Attending: Neurology | Admitting: Physical Therapy

## 2020-07-09 ENCOUNTER — Ambulatory Visit: Payer: Medicaid Other | Admitting: Occupational Therapy

## 2020-07-16 ENCOUNTER — Ambulatory Visit: Payer: Medicaid Other | Admitting: Occupational Therapy

## 2020-07-16 ENCOUNTER — Ambulatory Visit: Payer: Medicaid Other | Admitting: Physical Therapy

## 2020-07-17 ENCOUNTER — Other Ambulatory Visit: Payer: Self-pay | Admitting: Internal Medicine

## 2020-07-17 DIAGNOSIS — I10 Essential (primary) hypertension: Secondary | ICD-10-CM

## 2020-07-19 ENCOUNTER — Other Ambulatory Visit: Payer: Self-pay

## 2020-07-19 DIAGNOSIS — I10 Essential (primary) hypertension: Secondary | ICD-10-CM

## 2020-07-19 MED ORDER — HYDROCHLOROTHIAZIDE 25 MG PO TABS: ORAL_TABLET | ORAL | 1 refills | Status: DC

## 2020-07-19 NOTE — Telephone Encounter (Signed)
hydrochlorothiazide (HYDRODIURIL) 25 MG tablet, refill request @  [facility] #[id number] [place] Otto, [place] - [address] Phone:  [phone]  Fax:  [phone]

## 2020-07-23 ENCOUNTER — Ambulatory Visit: Payer: Medicaid Other | Admitting: Physical Therapy

## 2020-07-23 ENCOUNTER — Ambulatory Visit: Payer: Medicaid Other | Admitting: Occupational Therapy

## 2020-07-29 ENCOUNTER — Encounter: Payer: Medicaid Other | Admitting: Internal Medicine

## 2020-08-06 ENCOUNTER — Other Ambulatory Visit: Payer: Self-pay | Admitting: *Deleted

## 2020-08-06 DIAGNOSIS — I10 Essential (primary) hypertension: Secondary | ICD-10-CM

## 2020-08-07 MED ORDER — LOSARTAN POTASSIUM 25 MG PO TABS: ORAL_TABLET | ORAL | 2 refills | Status: DC

## 2020-08-09 NOTE — Progress Notes (Deleted)
Assessment/Plan:   1.  Parkinsons Disease, diagnosed 54 years old but largely untreated for the first 9 years of dx, started levodopa April, 2021  -***Continue carbidopa/levodopa 25/100, 1 tablet 3 times per day.  2.  Hypertension  -Being managed in the resident clinic.  On 3 agents, losartan, hydrochlorothiazide and amlodipine. Subjective:   Teresa Logan was seen today in follow up for young onset Parkinsons disease.  My previous records were reviewed prior to todays visit as well as outside records available to me. Haven't seen pt since dx in April.  Was supposed to f/u in sept.  On day of visit, she changed appt to VV but didn't respond to the VV link.   We started levodopa last visit.  Reports that tolerating med.  Pt denies falls.  Pt denies lightheadedness, near syncope.  No hallucinations.  Mood has been good.  She attended outpatient physical, speech, occupational therapies since last visit and those notes are reviewed.  She saw primary care in June 28.  Blood pressure was elevated.  She was started on losartan, in addition to hydrochlorothiazide and amlodipine.  Current prescribed movement disorder medications: ***Carbidopa/levodopa 25/100, 1 tablet 3 times per day (started last visit)    PREVIOUS MEDICATIONS: {Parkinson's RX:18200}  ALLERGIES:  No Known Allergies  CURRENT MEDICATIONS:  Outpatient Encounter Medications as of 08/13/2020  Medication Sig  . amLODipine (NORVASC) 10 MG tablet Take 1 tablet (10 mg total) by mouth daily.  Marland Kitchen aspirin EC 81 MG tablet Take 162 mg by mouth daily.  . carbidopa-levodopa (SINEMET IR) 25-100 MG tablet Take 1 tablet by mouth 3 (three) times daily.  . hydrochlorothiazide (HYDRODIURIL) 25 MG tablet TAKE 1 TABLET(25 MG) BY MOUTH DAILY  . ibuprofen (ADVIL,MOTRIN) 600 MG tablet Take 1 tablet (600 mg total) by mouth every 6 (six) hours as needed.  Marland Kitchen losartan (COZAAR) 25 MG tablet TAKE 1 TABLET(25 MG) BY MOUTH DAILY  . rosuvastatin (CRESTOR)  10 MG tablet Take 1 tablet (10 mg total) by mouth daily.   No facility-administered encounter medications on file as of 08/13/2020.    Objective:   PHYSICAL EXAMINATION:    VITALS:  There were no vitals filed for this visit.  GEN:  The patient appears stated age and is in NAD. HEENT:  Normocephalic, atraumatic.  The mucous membranes are moist. The superficial temporal arteries are without ropiness or tenderness. CV:  RRR Lungs:  CTAB Neck/HEME:  There are no carotid bruits bilaterally.  Neurological examination:  Orientation: The patient is alert and oriented x3. Cranial nerves: There is good facial symmetry with*** facial hypomimia. The speech is fluent and clear. Soft palate rises symmetrically and there is no tongue deviation. Hearing is intact to conversational tone. Sensation: Sensation is intact to light touch throughout Motor: Strength is at least antigravity x4.  Movement examination: Tone: There is ***tone in the *** Abnormal movements: *** Coordination:  There is *** decremation with RAM's, *** Gait and Station: The patient has *** difficulty arising out of a deep-seated chair without the use of the hands. The patient's stride length is ***.  The patient has a *** pull test.     I have reviewed and interpreted the following labs independently    Chemistry      Component Value Date/Time   NA 142 03/25/2020 1403   K 3.8 03/25/2020 1403   CL 102 03/25/2020 1403   CO2 23 03/25/2020 1403   BUN 12 03/25/2020 1403   CREATININE 0.77 03/25/2020 1403  Component Value Date/Time   CALCIUM 9.9 03/25/2020 1403   ALKPHOS 107 01/10/2020 1450   AST 19 01/10/2020 1450   ALT 16 01/10/2020 1450   BILITOT 0.3 01/10/2020 1450       Lab Results  Component Value Date   WBC 9.7 12/08/2019   HGB 16.8 (H) 12/08/2019   HCT 50.4 (H) 12/08/2019   MCV 86 12/08/2019   PLT 328 12/08/2019    No results found for: TSH   Total time spent on today's visit was ***30 minutes,  including both face-to-face time and nonface-to-face time.  Time included that spent on review of records (prior notes available to me/labs/imaging if pertinent), discussing treatment and goals, answering patient's questions and coordinating care.  Cc:  Albertha Ghee, MD

## 2020-08-13 ENCOUNTER — Ambulatory Visit: Payer: Medicaid Other | Admitting: Neurology

## 2020-08-14 ENCOUNTER — Encounter: Payer: Medicaid Other | Admitting: Internal Medicine

## 2020-08-14 NOTE — Progress Notes (Signed)
Assessment/Plan:   1.  Parkinsons Disease, diagnosed 53 years old but largely untreated for the first 9 years of dx, started levodopa April, 2021  -Continue carbidopa/levodopa 25/100, 1 tablet 3 times per day.  She takes it 4pm/8pm/midnight as she sleeps all day and is then awake much of the night (doesn't work but used to work nights and kept this schedule).  Would like to see her change this as her body likely won't tolerate as ages.  She is also not active during the waking hours and needs to be.  -start pramipexole and work to 0.5 mg tid.  Risks, benefits, side effects, including but not limited to sleep attacks and compulsive behaviors were discussed.  The opportunity to ask questions was given and they were answered to the best of my ability.  The patient expressed understanding and willingness to follow the outlined treatment protocols.  2.  Hypertension  -Being managed in the resident clinic.  On 3 agents, losartan, hydrochlorothiazide and amlodipine.  3.  Discussed importance of compliance with follow-up visits.  Discussed no-show policy for the office.  She has no showed the last 2 visits in a row .  She expressed understanding. Subjective:   Teresa Logan was seen today in follow up for young onset Parkinsons disease.  My previous records were reviewed prior to todays visit as well as outside records available to me. Haven't seen pt since dx in April.  Was supposed to f/u in sept.  On day of visit, she changed appt to VV but didn't respond to the VV link.   We started levodopa last visit.  Reports that tolerating med.  States that "I take it at 4pm/8pm/midnight."  She has worked most of her life during the nights so sleeps days and goes to bed nights.  Pt denies falls.  Pt denies lightheadedness, near syncope.  No hallucinations.  Mood has been good.  She attended outpatient physical, speech, occupational therapies since last visit and those notes are reviewed.  She saw primary care  in June 28.  Blood pressure was elevated.  She was started on losartan, in addition to hydrochlorothiazide and amlodipine.  Current prescribed movement disorder medications: Carbidopa/levodopa 25/100, 1 tablet 3 times per day (started last visit) -   ALLERGIES:  No Known Allergies  CURRENT MEDICATIONS:  Outpatient Encounter Medications as of 08/19/2020  Medication Sig  . amLODipine (NORVASC) 10 MG tablet Take 1 tablet (10 mg total) by mouth daily.  Marland Kitchen aspirin EC 81 MG tablet Take 162 mg by mouth daily.  . carbidopa-levodopa (SINEMET IR) 25-100 MG tablet Take 1 tablet by mouth 3 (three) times daily.  . hydrochlorothiazide (HYDRODIURIL) 25 MG tablet TAKE 1 TABLET(25 MG) BY MOUTH DAILY  . ibuprofen (ADVIL,MOTRIN) 600 MG tablet Take 1 tablet (600 mg total) by mouth every 6 (six) hours as needed.  Marland Kitchen losartan (COZAAR) 25 MG tablet TAKE 1 TABLET(25 MG) BY MOUTH DAILY  . rosuvastatin (CRESTOR) 10 MG tablet Take 1 tablet (10 mg total) by mouth daily.   No facility-administered encounter medications on file as of 08/19/2020.    Objective:   PHYSICAL EXAMINATION:    VITALS:   Vitals:   08/19/20 1357  BP: 139/85  Pulse: 97  SpO2: 97%  Weight: 218 lb (98.9 kg)  Height: 5\' 6"  (1.676 m)    GEN:  The patient appears stated age and is in NAD. HEENT:  Normocephalic, atraumatic.  The mucous membranes are moist. The superficial temporal arteries are without  ropiness or tenderness. CV:  RRR Lungs:  CTAB Neck/HEME:  There are no carotid bruits bilaterally.  Neurological examination:  Orientation: The patient is alert and oriented x3. Cranial nerves: There is good facial symmetry with facial hypomimia. The speech is fluent and clear. Soft palate rises symmetrically and there is no tongue deviation. Hearing is intact to conversational tone. Sensation: Sensation is intact to light touch throughout Motor: Strength is at least antigravity x4.  Movement examination: Tone: There is mod to severe  increased tone in the RUE/RLE and mild to mod in the LUE Abnormal movements: there is RUE/RLE rest tremor and mild LUE rest tremor Coordination:  There is  decremation with RAM's, R>L Gait and Station: The patient has no difficulty arising out of a deep-seated chair without the use of the hands. The patient's stride length is short stepped but she is forward flexed at the waist.    I have reviewed and interpreted the following labs independently    Chemistry      Component Value Date/Time   NA 142 03/25/2020 1403   K 3.8 03/25/2020 1403   CL 102 03/25/2020 1403   CO2 23 03/25/2020 1403   BUN 12 03/25/2020 1403   CREATININE 0.77 03/25/2020 1403      Component Value Date/Time   CALCIUM 9.9 03/25/2020 1403   ALKPHOS 107 01/10/2020 1450   AST 19 01/10/2020 1450   ALT 16 01/10/2020 1450   BILITOT 0.3 01/10/2020 1450       Lab Results  Component Value Date   WBC 9.7 12/08/2019   HGB 16.8 (H) 12/08/2019   HCT 50.4 (H) 12/08/2019   MCV 86 12/08/2019   PLT 328 12/08/2019    No results found for: TSH   Total time spent on today's visit was 30 minutes, including both face-to-face time and nonface-to-face time.  Time included that spent on review of records (prior notes available to me/labs/imaging if pertinent), discussing treatment and goals, answering patient's questions and coordinating care.  Cc:  Albertha Ghee, MD

## 2020-08-19 ENCOUNTER — Encounter: Payer: Self-pay | Admitting: Neurology

## 2020-08-19 ENCOUNTER — Other Ambulatory Visit: Payer: Self-pay

## 2020-08-19 ENCOUNTER — Ambulatory Visit: Payer: Medicaid Other | Admitting: Neurology

## 2020-08-19 VITALS — BP 139/85 | HR 97 | Ht 66.0 in | Wt 218.0 lb

## 2020-08-19 DIAGNOSIS — G20 Parkinson's disease: Secondary | ICD-10-CM | POA: Diagnosis not present

## 2020-08-19 MED ORDER — PRAMIPEXOLE DIHYDROCHLORIDE 0.125 MG PO TABS: ORAL_TABLET | ORAL | 0 refills | Status: DC

## 2020-08-19 MED ORDER — PRAMIPEXOLE DIHYDROCHLORIDE 0.5 MG PO TABS
0.5000 mg | ORAL_TABLET | Freq: Three times a day (TID) | ORAL | 1 refills | Status: DC
Start: 2020-08-19 — End: 2021-02-20

## 2020-08-19 MED ORDER — CARBIDOPA-LEVODOPA 25-100 MG PO TABS: 1.0000 | ORAL_TABLET | Freq: Three times a day (TID) | ORAL | 1 refills | Status: DC

## 2020-08-19 NOTE — Patient Instructions (Addendum)
Start mirapex (pramipexole) as follows:  0.125 mg - 1 tablet three times per day for a week, then 2 tablets three times per day for a week and then fill the 0.5 mg tablet and take that, 1 pill three times per day   Continue carbidopa/levodopa 25/100 three times per day  The physicians and staff at [facility] are committed to providing excellent care. You may receive a survey requesting feedback about your experience at our office. We strive to receive "very good" responses to the survey questions. If you feel that your experience would prevent you from giving the office a "very good " response, please contact our office to try to remedy the situation. We may be reached at [phone]. Thank you for taking the time out of your busy day to complete the survey.

## 2020-08-20 ENCOUNTER — Encounter: Payer: Medicaid Other | Admitting: Internal Medicine

## 2020-08-26 ENCOUNTER — Encounter: Payer: Self-pay | Admitting: Occupational Therapy

## 2020-08-26 NOTE — Therapy (Signed)
[facility] [address] [place], [place], [address] Phone: [phone]   Fax:  [phone]  Patient Details  Name: Teresa Logan MRN: [id number] Date of Birth: [birth year] Referring Provider:  No ref. provider found  Encounter Date: 08/26/2020  OCCUPATIONAL THERAPY DISCHARGE SUMMARY  Visits from Start of Care: 4  Current functional level related to goals / functional outcomes:  OT Short Term Goals - 04/08/20 1220      OT SHORT TERM GOAL #1   Title Pt will be independent with PD-specific HEP.--check STGs 05/09/20    Baseline no HEP    Time 4    Period Weeks    Status Not fully met     OT SHORT TERM GOAL #2   Title Pt will be independent with R hand splint wear/care for improved R hand positioning, decr risk of contracture, and manage rigidity.    Baseline no splint, pt with decr MP ext and thumb abduction and moderate rigidity    Time 4    Period Weeks    Status Not met     OT SHORT TERM GOAL #3   Title Pt will improve BUE coordination/functional reaching for ADLs as shown by improving score on box and blocks test by at least 5 blocks bilaterally.    Baseline R-23, L-34 blocks    Time 4    Period Weeks    Status Not met/unable to reassess      OT SHORT TERM GOAL #4   Title Pt will demo ability to retrieve light object from overhead shelf using at least 135* R shoulder flex with elbow ext -35* or greater.    Baseline 130* shoulder flex with -50* elbow ext.    Time 4    Period Weeks    Status Not met/unable to reassess      OT SHORT TERM GOAL #5   Title Pt will demo improved coordination for ADLs as shown by improving time on 9-hole peg test by at least 8sec bilaterally.    Baseline R-70.57sec, L-52.12sec    Time 4    Period Weeks    Status Not met/unable to reassess      Additional Short Term Goals   Additional Short Term Goals --      OT SHORT TERM GOAL #6   Title Pt will be able to do simple  maintenance/meal prep tasks mod I.    Baseline children performing    Time 8    Period Weeks    Status Not met/unable to reassess           OT Long Term Goals - 04/08/20 1359      OT LONG TERM GOAL #1   Title Pt will verbalize understanding of adaptive strategies to incr ease, safety, and independence/participation with ADLs/IADLs.--check LTGs 06/09/20    Baseline dependent    Time 8    Period Weeks    Status Initiated, but not fully met      OT LONG TERM GOAL #2   Title Pt will improve RUE coordination/functional reaching for ADLs as shown by improving score on box and blocks test by at least 10 blocks bilaterally.    Baseline R-23 blocks    Time 8    Period Weeks    Status Not met/unable to reassess      OT LONG TERM GOAL #3   Title Pt will demo improved coordination for ADLs as shown by improving time on 9-hole peg test by at  least 15sec with RUE.    Baseline 70.57sec    Time 8    Period Weeks    Status Not met/unable to reassess      OT LONG TERM GOAL #4   Title Pt will demo improved ease/decr bradykinesia for with dressing as shown by improving time on PPT#4 (donning/doffing jacket) by at least 8sec.    Baseline 35.75sec    Time 8    Period Weeks    Status Not met/unable to reassess       OT LONG TERM GOAL #5   Title Pt will be able to use dominant RUE for bathing and eating at least 25% of the time.    Baseline only using LUE currently    Time 8    Period Weeks    Status Not met/unable to reassess       Long Term Additional Goals   Additional Long Term Goals Yes      OT LONG TERM GOAL #6   Title Pt will verbalize understanding of ways to decr risk of future complications related to PD and appropriate community resources.    Baseline dependent    Time 8    Period Weeks    Status Not met/unable to reassess             Remaining deficits: Bradykinesia, rigidity, decr coordination, decr ROM, abnormal posture, decr functional mobility   Unable to  address/re-assess goals due to pt not returning to therapy   Education / Equipment: Began education regarding HEP and strategies for ADLs; however, pt did not return to complete education  Plan: Patient agrees to discharge.  Patient goals were not met. Patient is being discharged due to not returning since the last visit.    ?????       [facility] 08/26/2020, 1:02 PM  [facility] [address] [place], [place], [address] Phone: [phone]   Fax:  [place], OTR/L [facility] [address] [place], [place]  [address] [phone] phone [phone] 08/26/20 1:02 PM

## 2020-08-27 ENCOUNTER — Telehealth: Payer: Self-pay

## 2020-08-27 ENCOUNTER — Ambulatory Visit: Payer: Medicaid Other | Admitting: Internal Medicine

## 2020-08-27 ENCOUNTER — Encounter: Payer: Self-pay | Admitting: Internal Medicine

## 2020-08-27 ENCOUNTER — Other Ambulatory Visit: Payer: Self-pay

## 2020-08-27 VITALS — BP 130/83 | HR 93 | Temp 97.6°F | Ht 66.0 in | Wt 219.0 lb

## 2020-08-27 DIAGNOSIS — Z1159 Encounter for screening for other viral diseases: Secondary | ICD-10-CM

## 2020-08-27 DIAGNOSIS — Z114 Encounter for screening for human immunodeficiency virus [HIV]: Secondary | ICD-10-CM

## 2020-08-27 DIAGNOSIS — Z23 Encounter for immunization: Secondary | ICD-10-CM | POA: Diagnosis not present

## 2020-08-27 DIAGNOSIS — I10 Essential (primary) hypertension: Secondary | ICD-10-CM

## 2020-08-27 DIAGNOSIS — G20 Parkinson's disease: Secondary | ICD-10-CM | POA: Diagnosis not present

## 2020-08-27 DIAGNOSIS — Z0000 Encounter for general adult medical examination without abnormal findings: Secondary | ICD-10-CM | POA: Diagnosis not present

## 2020-08-27 DIAGNOSIS — E785 Hyperlipidemia, unspecified: Secondary | ICD-10-CM | POA: Diagnosis not present

## 2020-08-27 MED ORDER — ROSUVASTATIN CALCIUM 10 MG PO TABS: 10.0000 mg | ORAL_TABLET | Freq: Every day | ORAL | 0 refills | Status: DC

## 2020-08-27 MED ORDER — OLMESARTAN-AMLODIPINE-HCTZ 40-5-12.5 MG PO TABS: 1.0000 | ORAL_TABLET | Freq: Every day | ORAL | 2 refills | Status: DC

## 2020-08-27 MED ORDER — CARBIDOPA-LEVODOPA 25-100 MG PO TABS: 1.0000 | ORAL_TABLET | Freq: Three times a day (TID) | ORAL | 1 refills | Status: DC

## 2020-08-27 NOTE — Telephone Encounter (Signed)
Patient left a voicemail stating she needs a new OT and PT referral.

## 2020-08-27 NOTE — Telephone Encounter (Signed)
Spoke with neuro rehab to confirm that the patient needed a new referral and once they receive it they will schedule her in January because she has Medicaid.   Patient notified that rx is ready for pick up at pharmacy and referral has been placed for therapy.    Patient voiced understanding.

## 2020-08-27 NOTE — Telephone Encounter (Signed)
Spoke with patient and she states she needs a new referral to go back to Neuro rehab for therapy.   Is this ok?    She also states that she has not heard from her pharmacy about the new medication, Pramipexole, Dr Tat wanted her to start.   Contacted [facility] and was informed that the rx has been received and is waiting for the patient to pick it up.

## 2020-08-27 NOTE — Patient Instructions (Addendum)
Thank you, Ms.Pricilla Holm for allowing [place] to provide your care today. Today we discussed blood pressure, healthcare maintenance, and refills on medication. .    I have ordered the following labs for you:   Lab Orders     BMP8+Anion Gap     HIV antibody (with reflex)     Hepatitis C antibody   Tests ordered today:    Referrals ordered today:   Referral Orders  No referral(s) requested today     I have ordered the following medication/changed the following medications:   Stop the following medications: Medications Discontinued During This Encounter  Medication Reason  . hydrochlorothiazide (HYDRODIURIL) 25 MG tablet Change in therapy  . losartan (COZAAR) 25 MG tablet Change in therapy  . amLODipine (NORVASC) 10 MG tablet Change in therapy  . rosuvastatin (CRESTOR) 10 MG tablet Reorder  . carbidopa-levodopa (SINEMET IR) 25-100 MG tablet Reorder     Start the following medications: Meds ordered this encounter  Medications  . rosuvastatin (CRESTOR) 10 MG tablet    Sig: Take 1 tablet (10 mg total) by mouth daily.    Dispense:  90 tablet    Refill:  0  . carbidopa-levodopa (SINEMET IR) 25-100 MG tablet    Sig: Take 1 tablet by mouth 3 (three) times daily.    Dispense:  270 tablet    Refill:  1  . Olmesartan-amLODIPine-HCTZ 40-5-12.5 MG TABS    Sig: Take 1 tablet by mouth daily.    Dispense:  90 tablet    Refill:  2     Follow up: 3 months    Remember: I will follow up with results of your lab work.   Should you have any questions or concerns please call the internal medicine clinic at [phone].      Thurmon Fair, M.D. [facility]  Schedule Now - Ages 32 and Above. Appointments are required. To register for your free vaccination appointment, click the link on the date on the calendar below or call [phone] (Mon-Fri 7 a.m. to 7 p.m).  Please wear a mask, plan to remain socially distanced and wear loose  clothing. Pfizer clinics are for ages 12+. All other vaccine types are 18+. NOTICE: State law now requires written consent from a parent or legal guardian for any minor ages 28 to 31 prior to administrating any vaccine that that has been granted emergency use authorization (EUA). This includes the Pfizer vaccine since it remains under EUA for minors.

## 2020-08-27 NOTE — Telephone Encounter (Signed)
Ok although there is just a note from OT from yesterday stating goals were not met as patient never returned to therapy.  You can send new referral if they need new one.

## 2020-08-27 NOTE — Progress Notes (Signed)
   CC: hypertension  HPI:[name] is a 54 y.o. female who presents for evaluation of hypertension. Please see individual problem based A/P for details.  Past Medical History:  Diagnosis Date  . HTN (hypertension)   . Kidney stones   . Parkinson's disease (HCC)   . Tremors of nervous system    Review of Systems:   Review of Systems  Respiratory: Negative for cough and shortness of breath.   Gastrointestinal: Negative for constipation and melena.  Neurological: Positive for tremors. Negative for dizziness.     Physical Exam: Vitals:   08/27/20 1521  BP: 130/83  Pulse: 93  Temp: 97.6 F (36.4 C)  TempSrc: Oral  SpO2: 100%  Weight: 219 lb (99.3 kg)  Height: 5\' 6"  (1.676 m)   General: NAD, tremor at rest HEENT: Normocephalic, atraumatic , Conjunctiva nl  Cardiovascular: Normal rate, regular rhythm.  No murmurs, rubs, or gallops Pulmonary : Equal breath sounds, No wheezes, rales, or rhonchi Abdominal: soft, nontender,  bowel sounds present   Assessment & Plan:   See Encounters Tab for problem based charting.  Patient discussed with Dr. 

## 2020-08-28 ENCOUNTER — Encounter: Payer: Self-pay | Admitting: Internal Medicine

## 2020-08-28 DIAGNOSIS — Z114 Encounter for screening for human immunodeficiency virus [HIV]: Secondary | ICD-10-CM

## 2020-08-28 DIAGNOSIS — Z1159 Encounter for screening for other viral diseases: Secondary | ICD-10-CM

## 2020-08-28 HISTORY — DX: Encounter for screening for human immunodeficiency virus (HIV): Z11.4

## 2020-08-28 HISTORY — DX: Encounter for screening for other viral diseases: Z11.59

## 2020-08-28 LAB — BMP8+ANION GAP
Anion Gap: 19 mmol/L — ABNORMAL HIGH (ref 10.0–18.0)
BUN/Creatinine Ratio: 20 (ref 9–23)
BUN: 16 mg/dL (ref 6–24)
CO2: 23 mmol/L (ref 20–29)
Calcium: 9.9 mg/dL (ref 8.7–10.2)
Chloride: 102 mmol/L (ref 96–106)
Creatinine, Ser: 0.81 mg/dL (ref 0.57–1.00)
GFR calc Af Amer: 95 mL/min/{1.73_m2} (ref >59)
GFR calc non Af Amer: 82 mL/min/{1.73_m2} (ref >59)
Glucose: 113 mg/dL — ABNORMAL HIGH (ref 65–99)
Potassium: 4 mmol/L (ref 3.5–5.2)
Sodium: 144 mmol/L (ref 134–144)

## 2020-08-28 LAB — HEPATITIS C ANTIBODY: Hep C Virus Ab: <0.1 s/co ratio (ref 0.0–0.9)

## 2020-08-28 LAB — HIV ANTIBODY (ROUTINE TESTING W REFLEX): HIV Screen 4th Generation wRfx: NONREACTIVE

## 2020-08-28 NOTE — Assessment & Plan Note (Signed)
Refill crestor

## 2020-08-28 NOTE — Assessment & Plan Note (Signed)
   Encounter for hepatitis C screening test for low risk patient  - Hepatitis C antibody  Addendum:   Hep C antibody negative

## 2020-08-28 NOTE — Assessment & Plan Note (Addendum)
Encounter for screening for HIV: low risk one time screening  - HIV antibody (with reflex)   Addendum:  HIV antibody , non reactive

## 2020-08-28 NOTE — Assessment & Plan Note (Signed)
Refill - carbidopa-levodopa (SINEMET IR) 25-100 MG tablet; Take 1 tablet by mouth 3 (three) times daily.  Dispense: 270 tablet; Refill: 1

## 2020-08-30 NOTE — Progress Notes (Signed)
Internal Medicine Clinic Attending  Case discussed with Dr. Steen  At the time of the visit.  We reviewed the resident's history and exam and pertinent patient test results.  I agree with the assessment, diagnosis, and plan of care documented in the resident's note.  

## 2020-09-03 NOTE — Progress Notes (Signed)
Left voicemail with lab results for patient.

## 2020-09-09 ENCOUNTER — Other Ambulatory Visit: Payer: Self-pay | Admitting: Neurology

## 2020-09-09 ENCOUNTER — Other Ambulatory Visit: Payer: Self-pay | Admitting: Internal Medicine

## 2020-09-09 DIAGNOSIS — E785 Hyperlipidemia, unspecified: Secondary | ICD-10-CM

## 2020-09-09 DIAGNOSIS — G20 Parkinson's disease: Secondary | ICD-10-CM

## 2020-10-07 ENCOUNTER — Ambulatory Visit: Payer: Medicaid Other | Attending: Neurology | Admitting: Physical Therapy

## 2020-10-07 ENCOUNTER — Ambulatory Visit: Payer: Medicaid Other | Admitting: Occupational Therapy

## 2020-10-07 ENCOUNTER — Other Ambulatory Visit: Payer: Self-pay

## 2020-10-07 DIAGNOSIS — R293 Abnormal posture: Secondary | ICD-10-CM | POA: Diagnosis present

## 2020-10-07 DIAGNOSIS — R278 Other lack of coordination: Secondary | ICD-10-CM | POA: Insufficient documentation

## 2020-10-07 DIAGNOSIS — R2689 Other abnormalities of gait and mobility: Secondary | ICD-10-CM | POA: Insufficient documentation

## 2020-10-07 DIAGNOSIS — M6281 Muscle weakness (generalized): Secondary | ICD-10-CM | POA: Diagnosis present

## 2020-10-07 DIAGNOSIS — M25621 Stiffness of right elbow, not elsewhere classified: Secondary | ICD-10-CM | POA: Insufficient documentation

## 2020-10-07 DIAGNOSIS — R2681 Unsteadiness on feet: Secondary | ICD-10-CM

## 2020-10-07 DIAGNOSIS — M25611 Stiffness of right shoulder, not elsewhere classified: Secondary | ICD-10-CM | POA: Diagnosis present

## 2020-10-07 DIAGNOSIS — R29818 Other symptoms and signs involving the nervous system: Secondary | ICD-10-CM

## 2020-10-07 DIAGNOSIS — R29898 Other symptoms and signs involving the musculoskeletal system: Secondary | ICD-10-CM | POA: Diagnosis present

## 2020-10-07 DIAGNOSIS — R251 Tremor, unspecified: Secondary | ICD-10-CM

## 2020-10-07 DIAGNOSIS — M25641 Stiffness of right hand, not elsewhere classified: Secondary | ICD-10-CM | POA: Diagnosis present

## 2020-10-07 NOTE — Therapy (Signed)
[facility] [address] [place], [place], [address] Phone: [phone]   Fax:  [phone]  Occupational Therapy Evaluation  Patient Details  Name: Teresa Logan MRN: [id number] Date of Birth: [birth year] Referring Provider (OT): Dr. Lurena Joinerebecca Tat    Encounter Date: 10/07/2020   OT End of Session - 10/07/20 1326    Visit Number 1    Number of Visits 12    Date for OT Re-Evaluation 01/05/21    Authorization Type BCBS medicaid    OT Start Time 1233    OT Stop Time 1318    OT Time Calculation (min) 45 min           Past Medical History:  Diagnosis Date  . HTN (hypertension)   . Kidney stones   . Parkinson's disease (HCC)   . Tremors of nervous system     Past Surgical History:  Procedure Laterality Date  . TONSILLECTOMY      There were no vitals filed for this visit.   Subjective Assessment - 10/07/20 1236    Pertinent History Young Onset Parkinson's disease (formally diagnosed 2019).  PMH:  HTN, HLD, ?hx of essential tremor diagnosis    Currently in Pain? No/denies             [facility] OT Assessment - 10/07/20 0001      Assessment   Medical Diagnosis PD    Referring Provider (OT) Dr. Lurena Joinerebecca Tat     Onset Date/Surgical Date --   2019   Hand Dominance Right   but has started using Lt hand more   Next MD Visit 02/14/21    Prior Therapy began therapy last summer but did not return      Precautions   Precautions None      Balance Screen   Has the patient fallen in the past 6 months Yes    How many times? 1      Home  Environment   Family/patient expects to be discharged to: Private residence    Type of Home House    Home Access Stairs    Home Layout One level    Alternate Level Stairs - Number of Steps 3-5 to enter    Bathroom Shower/Tub Tub/Shower unit    Home Equipment Shower seat   don't use it   Additional Comments 2 other children that don't live with her    Lives With --    lives w/ 4 boys b/t ages 15-21     Prior Function   Level of Independence Independent    Vocation On disability    Leisure enjoys traveling, going out to eat, going to plays, swimming, watch tv      ADL   Eating/Feeding Needs assist with cutting food   uses LUE to eat   Grooming Modified independent   keeps hair short, no make up, Lt hand to brush teeth   Upper Body Bathing Modified independent   uses Lt hand   Lower Body Bathing Modified independent   difficulty getting to feet   Upper Body Dressing Increased time   wears slip on pants w/ difficulty, short socks, no bra   Lower Body Dressing Increased time   see above   Toilet Transfer Modified independent    Toileting - Clothing Manipulation Modified independent   wears dress at home   Toileting -  Hygiene Modified Independent    Tub/Shower Transfer Modified independent   tub/shower combo   Transfers/Ambulation Related  to ADL's may have to pull herself up    ADL comments pt reports difficulty opening bottles      IADL   Prior Level of Function Shopping now uses cart, able to retrieve items from shelf    Shopping Assistance for transportation    Prior Level of Function Light Housekeeping independent, but hasn't performed in approx 1-46yrs (delegates)    Light Housekeeping Does not participate in any housekeeping tasks    Meal Prep --   Sons assist in cooking for cutting, peeling, opening pkgs, lifting pots   Prior Level of Function Community Mobility hasn't driven since 2012    Community Mobility Relies on family or friends for transportation    Medication Management Is responsible for taking medication in correct dosages at correct time      Mobility   Mobility Status Comments see PT eval, needs to rest frequently      Written Expression   Dominant Hand Right    Handwriting --   fluctuates in legibility and micrographia the longer she writes     Vision - History   Baseline Vision Wears glasses only for reading    Additional  Comments denies change with PD      Cognition   Cognition Comments slight memory deficits reported      Observation/Other Assessments   Physical Performance Test   Yes    Simulated Eating Time (seconds) 25.09 sec    Donning Doffing Jacket Time (seconds) 52.44 sec w/ min assist   using hospital gown   Donning Doffing Jacket Comments Did not assess buttons as pt doesn't use buttons for quite some time. (However, at end of session pt said she would like to help grandchildren get dressed - ages 1 and 3)      Posture/Postural Control   Posture/Postural Control Postural limitations    Postural Limitations Forward head;Rounded Shoulders      Sensation   Light Touch Appears Intact      Coordination   Gross Motor Movements are Fluid and Coordinated No    Fine Motor Movements are Fluid and Coordinated No    9 Hole Peg Test Right;Left    Right 9 Hole Peg Test 54.31 sec   better than last July   Left 9 Hole Peg Test 40.72 sec    Box and Blocks R-23blocks, L-30 blocks    Tremors RUE primarily resting tremor, however noted some intentional tremor w/ simulated eating    Other pt reports difficulty releasing items with RUE    Coordination significant bradykinesia noted      AROM   Overall AROM  Deficits    Overall AROM Comments BUE AROM WFL's except difficulty with full elbow extension, supination and finger extension on Rt. Rt thumb also slightly adducted and flexed.      RUE Tone   RUE Tone Moderate      LUE Tone   LUE Tone Mild                             OT Short Term Goals - 10/07/20 1335      OT SHORT TERM GOAL #1   Title Pt will be independent with PD-specific HEP.--check STGs 05/09/20    Baseline issued last admission, but will need review    Time 6    Period Weeks    Status On-going      OT SHORT TERM GOAL #2   Title Pt will  be independent with R hand splint wear/care for improved R hand positioning, decr risk of contracture, and manage rigidity.     Baseline no splint, pt with decr MP ext and thumb abduction and moderate rigidity    Time 6    Period Weeks    Status New      OT SHORT TERM GOAL #3   Title Pt will improve BUE coordination/functional reaching for ADLs as shown by improving score on box and blocks test by at least 5 blocks bilaterally.    Baseline R-23, L-30 blocks    Time 6    Period Weeks    Status New      OT SHORT TERM GOAL #4   Title Pt will assist grandchildren in dressing    Baseline dependent    Time 6    Period Weeks    Status New      OT SHORT TERM GOAL #5   Title Pt will demo improved coordination for ADLs as shown by improving time on 9-hole peg test by at least 5 sec bilaterally.    Baseline R-54.31 sec sec, L-40.72 sec    Time 6    Period Weeks    Status New      OT SHORT TERM GOAL #6   Title Pt will be able to do simple maintenance/meal prep tasks with min assist    Baseline children performing    Time 6    Period Weeks    Status New             OT Long Term Goals - 10/07/20 1339      OT LONG TERM GOAL #1   Title Pt will verbalize understanding of adaptive strategies to incr ease, safety, and independence/participation with ADLs/IADLs.--check LTGs 06/09/20    Baseline dependent    Time 12    Period Weeks    Status New      OT LONG TERM GOAL #2   Title Pt will improve RUE coordination/functional reaching for ADLs as shown by improving score on box and blocks test by at least 10 blocks bilaterally.    Baseline R-23 blocks    Time 12    Period Weeks    Status New      OT LONG TERM GOAL #3   Title Pt to write 2 sentences maintaining 80% or greater legibility and letter size    Baseline name only 90%    Time 12    Period Weeks    Status New      OT LONG TERM GOAL #4   Title Pt will demo improved ease/decr bradykinesia for with dressing as shown by improving time on PPT#4 (donning/doffing jacket) by at least 10 sec.    Baseline 52.44 sec    Time 12    Period Weeks    Status  New      OT LONG TERM GOAL #5   Title Pt will be able to use dominant RUE for bathing and eating at least 25% of the time.    Baseline only using LUE currently    Time 12    Period Weeks    Status New      OT LONG TERM GOAL #6   Title Pt will verbalize understanding of ways to decr risk of future complications related to PD and appropriate community resources.    Baseline dependent    Time 12    Period Weeks    Status New  Plan - 10/07/20 1328    Clinical Impression Statement Pt is a 54 y.o. female with young-onset PD (first symptoms at approx 55 y.o.) who returns to this clinic for O.T. evaluation (pt was seen for 4 visits but did not return due to transportation issues and covid). Pt w/ PMH that includes HTN, HLD. Decline over the past 2 years and is on disability now. Pt having difficulty w/ ADLS, children doing a lot of IADLS. Pt presents today with decr balance and mobility, abnormal posture, rigidity, bradykinesia, decreased RUE functional use. Pt would benefit from O.T. to address these deficits, decr risk of future complications, and incr ADL/IADL participation and improved quality of life.    OT Occupational Profile and History Problem Focused Assessment - Including review of records relating to presenting problem    Occupational performance deficits (Please refer to evaluation for details): ADL's;IADL's    Body Structure / Function / Physical Skills ADL;Balance;IADL;ROM;Dexterity;Improper spinal/pelvic alignment;Mobility;Flexibility;Tone;FMC;Coordination;Decreased knowledge of precautions;Decreased knowledge of use of DME;GMC;UE functional use    Rehab Potential Good    OT Frequency 1x / week    OT Duration 12 weeks    OT Treatment/Interventions Self-care/ADL training;Moist Heat;Fluidtherapy;DME and/or AE instruction;Splinting;Therapeutic activities;Aquatic Therapy;Therapeutic exercise;Passive range of motion;Functional Mobility Training;Neuromuscular  education;Paraffin;Manual Therapy;Energy conservation;Patient/family education    Plan review PWR! HEPS    Consulted and Agree with Plan of Care Patient          Managed medicaid CPT codes: 66063- Therapeutic Exercise, [phone]- Neuro Re-education, [phone] - Manual Therapy, 97530 - Therapeutic Activities, [phone] - Self Care, [phone] - Orthotic Fit, [phone] - Fluidotherapy and [phone] -  Paraffin   Patient will benefit from skilled therapeutic intervention in order to improve the following deficits and impairments:   Body Structure / Function / Physical Skills: ADL,Balance,IADL,ROM,Dexterity,Improper spinal/pelvic alignment,Mobility,Flexibility,Tone,FMC,Coordination,Decreased knowledge of precautions,Decreased knowledge of use of DME,GMC,UE functional use       Visit Diagnosis: Other symptoms and signs involving the nervous system  Other symptoms and signs involving the musculoskeletal system  Stiffness of right hand, not elsewhere classified  Stiffness of right elbow, not elsewhere classified  Stiffness of right shoulder, not elsewhere classified  Other lack of coordination  Tremor  Abnormal posture  Unsteadiness on feet    Problem List Patient Active Problem List   Diagnosis Date Noted  . Encounter for hepatitis C screening test for low risk patient 08/28/2020  . Encounter for screening for HIV 08/28/2020  . Hyperlipidemia 01/10/2020  . Parkinson's disease (HCC)   . Hypertension   . Screening for colorectal cancer   . Breast cancer screening     Teresa Logan, Teresa Logan 10/07/2020, 1:45 PM  [facility] [address] [place], [place], [address] Phone: [phone]   Fax:  [phone]  Name: Teresa Logan MRN: [id number] Date of Birth: 1966-09-01

## 2020-10-07 NOTE — Therapy (Signed)
[facility] [address] [place], [place], [address] Phone: [phone]   Fax:  [phone]  Physical Therapy Evaluation  Patient Details  Name: Teresa HolmCynthia R Parks Logan MRN: [id number] Date of Birth: 26-Jun-1966 Referring Provider (PT): Kerin Salenat, Rebecca, DO   Encounter Date: 10/07/2020   PT End of Session - 10/07/20 1549    Visit Number 1    Number of Visits 9    Authorization Type BCBS Medicaid ([employer] [place])    PT Start Time 1318    PT Stop Time 1358    PT Time Calculation (min) 40 min    Activity Tolerance Patient tolerated treatment well    Behavior During Therapy [facility] for tasks assessed/performed           Past Medical History:  Diagnosis Date  . HTN (hypertension)   . Kidney stones   . Parkinson's disease (HCC)   . Tremors of nervous system     Past Surgical History:  Procedure Laterality Date  . TONSILLECTOMY      There were no vitals filed for this visit.    Subjective Assessment - 10/07/20 1323    Subjective Added the Mirapex since I was here last, and I feel that has helped with my rigidity.  It tend to move very slow; afraid because my balance isn't 100%.  Have had a fall last month in the shower at my daughter's, trying to get out of the shower and fell backwards.    Pertinent History PD (formally diagnosed in 2019), HTN    Patient Stated Goals Pt's goals for therapy are to improve balance, strength, and endurance    Currently in Pain? No/denies              [facility] - 10/07/20 1326      Assessment   Medical Diagnosis PD    Referring Provider (PT) Tat, Lurena Joinerebecca, DO    Onset Date/Surgical Date 08/27/20   recent referral; 2019 PD dx   Hand Dominance Right    Next MD Visit 02/14/21    Prior Therapy began therapy last summer but did not return      Precautions   Precautions Fall    Precaution Comments Pt not currently driving      Balance Screen   Has the patient fallen in  the past 6 months Yes    How many times? 1    Has the patient had a decrease in activity level because of a fear of falling?  No    Is the patient reluctant to leave their home because of a fear of falling?  No      Home Environment   Living Environment Private residence    Living Arrangements Children   16.17, 56 19 56 year olds   Available Help at Discharge Family   Children help with cooking and cleaning   Type of Home House    Home Access Stairs to enter    Entrance Stairs-Number of Steps 3    Entrance Stairs-Rails None    Home Layout One level    Home Equipment Shower seat;Grab bars - toilet;Grab bars - tub/shower   Has not been using shower chair     Prior Function   Level of Independence Independent    Vocation On disability    Leisure Gets up some during the day-mailbox, walking around the house.  Does grocery shopping      Observation/Other Assessments   Observations resting tremor R and and  RLE, and L hand (more mild)      Posture/Postural Control   Posture/Postural Control Postural limitations    Postural Limitations Forward head;Rounded Shoulders      Tone   Assessment Location Right Lower Extremity;Left Lower Extremity      ROM / Strength   AROM / PROM / Strength Strength      Strength   Overall Strength Deficits    Strength Assessment Site Hip;Knee;Ankle    Right/Left Hip Right;Left    Right Hip Flexion 3+/5    Left Hip Flexion 4/5    Right/Left Knee Right;Left    Right Knee Flexion 4/5    Right Knee Extension 4/5    Left Knee Flexion 4+/5    Left Knee Extension 4+/5    Right/Left Ankle Right;Left    Right Ankle Dorsiflexion 4/5    Left Ankle Dorsiflexion 4/5      Transfers   Transfers Sit to Stand;Stand to Sit    Sit to Stand 5: Supervision;Without upper extremity assist;From chair/3-in-1    Five time sit to stand comments  18.69    Stand to Sit 5: Supervision;Without upper extremity assist;To chair/3-in-1      Ambulation/Gait   Ambulation/Gait Yes     Ambulation/Gait Assistance 5: Supervision    Assistive device None    Gait Pattern Step-through pattern;Decreased arm swing - right;Decreased arm swing - left;Decreased stride length;Decreased hip/knee flexion - right;Right foot flat;Left foot flat;Poor foot clearance - right;Trunk flexed    Ambulation Surface Level;Indoor    Gait velocity 13.94 sec =2.35 ft/sec    Stairs Yes    Stairs Assistance 6: Modified independent (Device/Increase time)    Stair Management Technique Two rails;Alternating pattern;Forwards   Without rails:  step-to pattern forwards, supervision   Number of Stairs 4    Height of Stairs 6      Standardized Balance Assessment   Standardized Balance Assessment Mini-BESTest;Timed Up and Go Test      Mini-BESTest   Sit To Stand Normal: Comes to stand without use of hands and stabilizes independently.    Rise to Toes Moderate: Heels up, but not full range (smaller than when holding hands), OR noticeable instability for 3 s.    Stand on one leg (left) Moderate: < 20 s   2.85, 1   Stand on one leg (right) Moderate: < 20 s   2.25, 1.53   Stand on one leg - lowest score 1    Compensatory Stepping Correction - Forward Moderate: More than one step is required to recover equilibrium    Compensatory Stepping Correction - Backward Moderate: More than one step is required to recover equilibrium    Compensatory Stepping Correction - Left Lateral Severe: Falls, or cannot step    Compensatory Stepping Correction - Right Lateral Severe:  Falls, or cannot step    Stepping Corredtion Lateral - lowest score 0    Stance - Feet together, eyes open, firm surface  Normal: 30s    Stance - Feet together, eyes closed, foam surface  Moderate: < 30s    Incline - Eyes Closed Normal: Stands independently 30s and aligns with gravity    Change in Gait Speed Moderate: Unable to change walking speed or signs of imbalance    Walk with head turns - Horizontal Moderate: performs head turns with reduction  in gait speed.    Walk with pivot turns Moderate:Turns with feet close SLOW (>4 steps) with good balance.    Step over obstacles Moderate: Steps over  box but touches box OR displays cautious behavior by slowing gait.    Timed UP & GO with Dual Task Moderate: Dual Task affects either counting OR walking (>10%) when compared to the TUG without Dual Task.    Mini-BEST total score 16      Timed Up and Go Test   Normal TUG (seconds) 13.59    Manual TUG (seconds) 13.25    Cognitive TUG (seconds) 17.13    TUG Comments Scores >13.5-15 seconds indicate increased fall risk/difficulty with dual tasking      RLE Tone   RLE Tone Mild      LLE Tone   LLE Tone Mild                      Objective measurements completed on examination: See above findings.               PT Education - 10/07/20 1547    Education Details Eval results, POC for PT    Person(s) Educated Patient    Methods Explanation    Comprehension Verbalized understanding            PT Short Term Goals - 10/07/20 1557      PT SHORT TERM GOAL #1   Title Pt will be indepent with initial HEP for improved overall functional strength, balance, and gait.  TARGET 11/08/2020    Baseline does not have formal HEP that she is doing    Time 4    Period Weeks    Status New      PT SHORT TERM GOAL #2   Title Pt will improve 5x sit<>Stand test to less than or equal to 15 seconds for improved functional strength.    Baseline 18.69 sec at eval    Time 4    Period Weeks    Status New      PT SHORT TERM GOAL #3   Title Pt will improve MiniBESTest score to at least 19/28 for decreased fall risk.    Baseline 16/28 at eval (<22/28 indicates increased fall risk)    Time 4    Period Weeks    Status New      PT SHORT TERM GOAL #4   Title Pt will verbalize understanding of fall prevention in home environment.    Baseline At fall risk per TUG and MiniBESTest; has had one fall    Time 4    Period Weeks    Status  New      PT SHORT TERM GOAL #5   Title Pt will improve TUG and TUG cognitive score to less than 10% difference for improved dual tasking, decreased fall risk with gait.    Baseline TUG 13.59 sec, TUG cog 17.13 sec    Time 4    Period Weeks    Status New             PT Long Term Goals - 10/07/20 1601      PT LONG TERM GOAL #1   Title Pt will be independent with final HEP to build upon functional gains made in therapy. LTG TARGET 12/06/2020 (may be extended due to [facility] auth)    Baseline no formal HEP    Time 8    Period Weeks    Status New      PT LONG TERM GOAL #2   Title Pt will improve miniBEST to at least a 22/28 in order to decr fall risk.    Baseline 16/28  at eval    Time 8    Period Weeks    Status New      PT LONG TERM GOAL #3   Title Pt will improve gait speed to at least 2.62 ft/sec in order to demo improved gait efficiency and decr fall risk.    Baseline 2.35 ft/sec at eval    Time 8    Period Weeks    Status New      PT LONG TERM GOAL #4   Title Pt will improve 5x sit<>stand to less than or equal to 12 sec for improved functional strength and transfer efficiency.    Baseline 18.69 sec at eval    Time 8    Period Weeks    Status New      PT LONG TERM GOAL #5   Title Pt will verbalize plans for continued community fitness upon d/c from PT.    Baseline no formal community fitness    Time 8    Period Weeks    Status New            Managed medicaid CPT codes: 01027- Therapeutic Exercise, [phone]- Neuro Re-education, [phone] - Gait Training, [phone] - Manual Therapy, [phone] - Therapeutic Activities and [phone] - Self Care         Plan - 10/07/20 1552    Clinical Impression Statement Pt is a 55 year old female with history of Parkinson's disease, who was seen previously at this clinic, but had to stop due to Covid-related illness.  Pt was last seen end of August 2021.  Pt presents today for PT eval, with change in medication (added Mirapex) since here  last; her functional mobility numbers appear to be improved since last visit, but she does remain at fall risk per TUG and MiniBESTest and demo decreased functional strength, decreased gait velocity.  She also presents with decreased balance, decreased timing and coordination of gait, tremors, bradykinesia.  She has had one fall in past 6 months.  She does not currently have a formal exercise program, and she would benefit from skilled PT to address the above stated deficits to decrease fall risk and to improve overall functional mobility.    Personal Factors and Comorbidities Comorbidity 2    Comorbidities PD, HTN    Examination-Activity Limitations Locomotion Level;Stand;Stairs;Squat;Hygiene/Grooming;Dressing;Transfers;Caring for Others    Stability/Clinical Decision Making Evolving/Moderate complexity    Clinical Decision Making Moderate    Rehab Potential Good    PT Frequency 1x / week   per patient request, as PT recommends 2x/wk   PT Duration 8 weeks    PT Treatment/Interventions ADLs/Self Care Home Management;DME Instruction;Neuromuscular re-education;Balance training;Therapeutic exercise;Therapeutic activities;Functional mobility training;Stair training;Gait training;Patient/family education;Energy conservation;Manual techniques    PT Next Visit Plan Initiate HEP, including walking program and likely PWR! Moves; work on improved balance, adding to HEP and fall prevention    Consulted and Agree with Plan of Care Patient           Patient will benefit from skilled therapeutic intervention in order to improve the following deficits and impairments:  Abnormal gait,Decreased activity tolerance,Decreased balance,Decreased coordination,Decreased endurance,Decreased strength,Difficulty walking,Impaired tone,Postural dysfunction  Visit Diagnosis: Other abnormalities of gait and mobility  Unsteadiness on feet  Muscle weakness (generalized)  Abnormal posture  Other symptoms and signs  involving the nervous system     Problem List Patient Active Problem List   Diagnosis Date Noted  . Encounter for hepatitis C screening test for low risk patient 08/28/2020  . Encounter  for screening for HIV 08/28/2020  . Hyperlipidemia 01/10/2020  . Parkinson's disease (HCC)   . Hypertension   . Screening for colorectal cancer   . Breast cancer screening     Tamsin Nader W. 10/07/2020, 4:04 PM  Gean MaidensMARRIOTT,Audy Dauphine W., PT   [facility] [address] [place], [place], [address] Phone: [phone]   Fax:  [phone]  Name: Teresa HolmCynthia R Parks Logan MRN: [id number] Date of Birth: January 06, 1966

## 2020-10-12 ENCOUNTER — Other Ambulatory Visit: Payer: Self-pay | Admitting: Internal Medicine

## 2020-10-16 ENCOUNTER — Other Ambulatory Visit: Payer: Self-pay

## 2020-10-16 ENCOUNTER — Ambulatory Visit: Payer: Medicaid Other | Admitting: Occupational Therapy

## 2020-10-16 ENCOUNTER — Ambulatory Visit: Payer: Medicaid Other | Admitting: Physical Therapy

## 2020-10-16 ENCOUNTER — Encounter: Payer: Self-pay | Admitting: Physical Therapy

## 2020-10-16 DIAGNOSIS — R29818 Other symptoms and signs involving the nervous system: Secondary | ICD-10-CM

## 2020-10-16 DIAGNOSIS — R278 Other lack of coordination: Secondary | ICD-10-CM

## 2020-10-16 DIAGNOSIS — R2689 Other abnormalities of gait and mobility: Secondary | ICD-10-CM | POA: Diagnosis not present

## 2020-10-16 DIAGNOSIS — R29898 Other symptoms and signs involving the musculoskeletal system: Secondary | ICD-10-CM

## 2020-10-16 DIAGNOSIS — R2681 Unsteadiness on feet: Secondary | ICD-10-CM

## 2020-10-16 DIAGNOSIS — R293 Abnormal posture: Secondary | ICD-10-CM

## 2020-10-16 NOTE — Patient Instructions (Addendum)
WALKING  Walking is a great form of exercise to increase your strength, endurance and overall fitness.  A walking program can help you start slowly and gradually build endurance as you go.  Everyone's ability is different, so each person's starting point will be different.  You do not have to follow them exactly.  The are just samples. You should simply find out what's right for you and stick to that program.   In the beginning, you'll start off walking 2-3 times a day for short distances.  As you get stronger, you'll be walking further at just 1-2 times per day.  A. You Can Walk For A Certain Length Of Time Each Day    Walk 2-3 minutes 3 times per day.  Increase 2 minutes every 2 days (3 times per day).  Work up to 25-30 minutes (1-2 times per day).   Example:   Day 1-2 3 minutes 3 times per day   Day 7-8 7 minutes 2-3 times per day   Day 13-14 14-15 minutes 1-2 times per day      At countertop - with exception of PWR Twist

## 2020-10-16 NOTE — Therapy (Signed)
[facility] [address] [place], [place], [address] Phone: [phone]   Fax:  [phone]  Physical Therapy Treatment  Patient Details  Name: Teresa Logan MRN: [id number] Date of Birth: August 06, 1966 Referring Provider (PT): Kerin Salen, DO   Encounter Date: 10/16/2020   PT End of Session - 10/16/20 1453    Visit Number 2    Number of Visits 9    Authorization Type BCBS Medicaid ([employer] [place])    PT Start Time 1319    PT Stop Time 1359    PT Time Calculation (min) 40 min    Activity Tolerance Patient tolerated treatment well    Behavior During Therapy [facility] for tasks assessed/performed           Past Medical History:  Diagnosis Date  . HTN (hypertension)   . Kidney stones   . Parkinson's disease (HCC)   . Tremors of nervous system     Past Surgical History:  Procedure Laterality Date  . TONSILLECTOMY      There were no vitals filed for this visit.   Subjective Assessment - 10/16/20 1322    Subjective No falls since she was here a week ago. Does some arm exercises and squats at home.    Pertinent History PD (formally diagnosed in 2019), HTN    Patient Stated Goals Pt's goals for therapy are to improve balance, strength, and endurance    Currently in Pain? No/denies                             [facility] Adult PT Treatment/Exercise - 10/16/20 0001      Therapeutic Activites    Therapeutic Activities Other Therapeutic Activities    Other Therapeutic Activities educated pt on purpose and rationale of walking program to build endurance - provided pt with handout. pt asking about buying a peloton treadmill to work out on, discussed that would not be potentially the safest or cost effective thing to buy, discussed with pt potentially getting a membership to use aerobic equipment such as a seated stepper/NuStep - pt in agreement           Pt performs PWR! Moves in standing position  at the countertop:    PWR! Up for improved posture 2 x 10 reps - verbal and demo cues for technique, cues for opening R hand big and full R elbow extension   PWR! Rock for improved weighshifting x10 reps B, then x5 reps each side, cues for big hands on R   PWR! Step for improved step initiation x5 reps B, cues for incr foot clearance    Cues provided for technique and intensity of movement. Pt reporting intensity at 7-8/10 after exercise.        Balance Exercises - 10/16/20 0001      Balance Exercises: Standing   SLS Eyes open;Solid surface;Limitations    SLS Limitations alternating SLS taps to bottom step x12 reps B, cues to fully lift foot off of step    Stepping Strategy Posterior;UE support;10 reps;Limitations    Stepping Strategy Limitations at the countertop, cues for weight shift and technique, pt needing fingertip support    Marching Forwards;Solid surface;Limitations    Marching Limitations down and back at countertop 2x, cues for slowed and controlled and incr hip/knee flexion, need for single UE/fingertip support             PT Education - 10/16/20 1453  Education Details initial walking program and HEP (standing PWR at countertop)    Person(s) Educated Patient    Methods Explanation;Demonstration;Handout;Verbal cues    Comprehension Verbalized understanding;Returned demonstration            PT Short Term Goals - 10/07/20 1557      PT SHORT TERM GOAL #1   Title Pt will be indepent with initial HEP for improved overall functional strength, balance, and gait.  TARGET 11/08/2020    Baseline does not have formal HEP that she is doing    Time 4    Period Weeks    Status New      PT SHORT TERM GOAL #2   Title Pt will improve 5x sit<>Stand test to less than or equal to 15 seconds for improved functional strength.    Baseline 18.69 sec at eval    Time 4    Period Weeks    Status New      PT SHORT TERM GOAL #3   Title Pt will improve MiniBESTest score to  at least 19/28 for decreased fall risk.    Baseline 16/28 at eval (<22/28 indicates increased fall risk)    Time 4    Period Weeks    Status New      PT SHORT TERM GOAL #4   Title Pt will verbalize understanding of fall prevention in home environment.    Baseline At fall risk per TUG and MiniBESTest; has had one fall    Time 4    Period Weeks    Status New      PT SHORT TERM GOAL #5   Title Pt will improve TUG and TUG cognitive score to less than 10% difference for improved dual tasking, decreased fall risk with gait.    Baseline TUG 13.59 sec, TUG cog 17.13 sec    Time 4    Period Weeks    Status New             PT Long Term Goals - 10/07/20 1601      PT LONG TERM GOAL #1   Title Pt will be independent with final HEP to build upon functional gains made in therapy. LTG TARGET 12/06/2020 (may be extended due to [facility] auth)    Baseline no formal HEP    Time 8    Period Weeks    Status New      PT LONG TERM GOAL #2   Title Pt will improve miniBEST to at least a 22/28 in order to decr fall risk.    Baseline 16/28 at eval    Time 8    Period Weeks    Status New      PT LONG TERM GOAL #3   Title Pt will improve gait speed to at least 2.62 ft/sec in order to demo improved gait efficiency and decr fall risk.    Baseline 2.35 ft/sec at eval    Time 8    Period Weeks    Status New      PT LONG TERM GOAL #4   Title Pt will improve 5x sit<>stand to less than or equal to 12 sec for improved functional strength and transfer efficiency.    Baseline 18.69 sec at eval    Time 8    Period Weeks    Status New      PT LONG TERM GOAL #5   Title Pt will verbalize plans for continued community fitness upon d/c from PT.    Baseline  no formal community fitness    Time 8    Period Weeks    Status New                 Plan - 10/16/20 1459    Clinical Impression Statement Today's skilled session focused on getting pt initiated with a walking program for home and initial  HEP. Performed standing PWR moves at countertop for safety as needed for balance with the exception of PWR Twist. Pt initially needing cues for larger movement patterns. No issues reported, pt feeling more fatigued after standing PWR Step. Will continue to progress towards LTGs.    Personal Factors and Comorbidities Comorbidity 2    Comorbidities PD, HTN    Examination-Activity Limitations Locomotion Level;Stand;Stairs;Squat;Hygiene/Grooming;Dressing;Transfers;Caring for Others    Stability/Clinical Decision Making Evolving/Moderate complexity    Rehab Potential Good    PT Frequency 1x / week   per patient request, as PT recommends 2x/wk   PT Duration 8 weeks    PT Treatment/Interventions ADLs/Self Care Home Management;DME Instruction;Neuromuscular re-education;Balance training;Therapeutic exercise;Therapeutic activities;Functional mobility training;Stair training;Gait training;Patient/family education;Energy conservation;Manual techniques    PT Next Visit Plan how is HEP? review PWR moves if appropriate. SciFit/NuStep. work on improved balance (SLS, stepping), adding to HEP and fall prevention    Consulted and Agree with Plan of Care Patient           Patient will benefit from skilled therapeutic intervention in order to improve the following deficits and impairments:  Abnormal gait,Decreased activity tolerance,Decreased balance,Decreased coordination,Decreased endurance,Decreased strength,Difficulty walking,Impaired tone,Postural dysfunction  Visit Diagnosis: Other symptoms and signs involving the nervous system  Other symptoms and signs involving the musculoskeletal system  Abnormal posture  Other lack of coordination  Unsteadiness on feet     Problem List Patient Active Problem List   Diagnosis Date Noted  . Encounter for hepatitis C screening test for low risk patient 08/28/2020  . Encounter for screening for HIV 08/28/2020  . Hyperlipidemia 01/10/2020  . Parkinson's  disease (HCC)   . Hypertension   . Screening for colorectal cancer   . Breast cancer screening     Drake Leach, PT, DPT  10/16/2020, 3:01 PM  [place] [facility] [address] [place], [place], [address] Phone: [phone]   Fax:  [phone]  Name: Teresa Logan MRN: [id number] Date of Birth: 02/23/1966

## 2020-10-23 ENCOUNTER — Ambulatory Visit: Payer: Medicaid Other | Admitting: Physical Therapy

## 2020-10-23 ENCOUNTER — Ambulatory Visit: Payer: Medicaid Other | Admitting: Occupational Therapy

## 2020-10-23 ENCOUNTER — Encounter: Payer: Self-pay | Admitting: Physical Therapy

## 2020-10-23 ENCOUNTER — Other Ambulatory Visit: Payer: Self-pay

## 2020-10-23 DIAGNOSIS — R2689 Other abnormalities of gait and mobility: Secondary | ICD-10-CM | POA: Diagnosis not present

## 2020-10-23 DIAGNOSIS — R293 Abnormal posture: Secondary | ICD-10-CM

## 2020-10-23 DIAGNOSIS — R278 Other lack of coordination: Secondary | ICD-10-CM

## 2020-10-23 DIAGNOSIS — M25641 Stiffness of right hand, not elsewhere classified: Secondary | ICD-10-CM

## 2020-10-23 DIAGNOSIS — M25621 Stiffness of right elbow, not elsewhere classified: Secondary | ICD-10-CM

## 2020-10-23 DIAGNOSIS — R29818 Other symptoms and signs involving the nervous system: Secondary | ICD-10-CM

## 2020-10-23 DIAGNOSIS — R29898 Other symptoms and signs involving the musculoskeletal system: Secondary | ICD-10-CM

## 2020-10-23 DIAGNOSIS — R2681 Unsteadiness on feet: Secondary | ICD-10-CM

## 2020-10-23 DIAGNOSIS — M25611 Stiffness of right shoulder, not elsewhere classified: Secondary | ICD-10-CM

## 2020-10-23 NOTE — Therapy (Signed)
[facility] [address] [place], [place], [address] Phone: [phone]   Fax:  [phone]  Occupational Therapy Treatment  Patient Details  Name: Teresa Logan MRN: [id number] Date of Birth: 01-18-1966 Referring Provider (OT): Dr. Lurena Joiner Tat    Encounter Date: 10/23/2020   OT End of Session - 10/23/20 1327    Visit Number 2    Number of Visits 12    Date for OT Re-Evaluation 01/05/21    Authorization Type BCBS medicaid    Authorization Time Period awit auth    OT Start Time 1320    OT Stop Time 1400    OT Time Calculation (min) 40 min           Past Medical History:  Diagnosis Date  . HTN (hypertension)   . Kidney stones   . Parkinson's disease (HCC)   . Tremors of nervous system     Past Surgical History:  Procedure Laterality Date  . TONSILLECTOMY      There were no vitals filed for this visit.   Subjective Assessment - 10/23/20 1326    Subjective  Denies pain    Pertinent History Young Onset Parkinson's disease (formally diagnosed 2019).  PMH:  HTN, HLD, ?hx of essential tremor diagnosis    Patient Stated Goals increase strength and use of R hand    Currently in Pain? No/denies                                OT Education - 10/23/20 1459    Education Details PWR! moves basic 4 in supine 10 reps each, minv.c, PWR! hands basic 4 and review of inital HEP for coordination, min v.c for larger amplitude movements,see pt instructions    Person(s) Educated Patient    Methods Explanation;Demonstration;Verbal cues;Handout    Comprehension Returned demonstration;Verbal cues required;Verbalized understanding            OT Short Term Goals - 10/23/20 1334      OT SHORT TERM GOAL #1   Title Pt will be independent with PD-specific HEP.    Baseline issued last admission, but will need review    Time 6    Period Weeks    Status On-going      OT SHORT TERM GOAL #2    Title Pt will be independent with R hand splint wear/care for improved R hand positioning, decr risk of contracture, and manage rigidity.    Baseline no splint, pt with decr MP ext and thumb abduction and moderate rigidity    Time 6    Period Weeks    Status New      OT SHORT TERM GOAL #3   Title Pt will improve BUE coordination/functional reaching for ADLs as shown by improving score on box and blocks test by at least 5 blocks bilaterally.    Baseline R-23, L-30 blocks    Time 6    Period Weeks    Status New      OT SHORT TERM GOAL #4   Title Pt will assist grandchildren in dressing    Baseline dependent    Time 6    Period Weeks    Status New      OT SHORT TERM GOAL #5   Title Pt will demo improved coordination for ADLs as shown by improving time on 9-hole peg test by at least 5 sec bilaterally.    Baseline  R-54.31 sec sec, L-40.72 sec    Time 6    Period Weeks    Status New      OT SHORT TERM GOAL #6   Title Pt will be able to do simple maintenance/meal prep tasks with min assist    Baseline children performing    Time 6    Period Weeks    Status New             OT Long Term Goals - 10/23/20 1333      OT LONG TERM GOAL #1   Title Pt will verbalize understanding of adaptive strategies to incr ease, safety, and independence/participation with ADLs/IADLs.-    Baseline dependent    Time 12    Period Weeks    Status New      OT LONG TERM GOAL #2   Title Pt will improve RUE coordination/functional reaching for ADLs as shown by improving score on box and blocks test by at least 10 blocks bilaterally.    Baseline R-23 blocks    Time 12    Period Weeks    Status New      OT LONG TERM GOAL #3   Title Pt to write 2 sentences maintaining 80% or greater legibility and letter size    Baseline name only 90%    Time 12    Period Weeks    Status New      OT LONG TERM GOAL #4   Title Pt will demo improved ease/decr bradykinesia for with dressing as shown by improving  time on PPT#4 (donning/doffing jacket) by at least 10 sec.    Baseline 52.44 sec    Time 12    Period Weeks    Status New      OT LONG TERM GOAL #5   Title Pt will be able to use dominant RUE for bathing and eating at least 25% of the time.    Baseline only using LUE currently    Time 12    Period Weeks    Status New      OT LONG TERM GOAL #6   Title Pt will verbalize understanding of ways to decr risk of future complications related to PD and appropriate community resources.    Baseline dependent    Time 12    Period Weeks    Status New                 Plan - 10/23/20 1352    Clinical Impression Statement pt isprogressing towards goals. she demonstrates understanding of PWR! hands, coordination HEP and PWR! supine    OT Occupational Profile and History Problem Focused Assessment - Including review of records relating to presenting problem    Occupational performance deficits (Please refer to evaluation for details): ADL's;IADL's    Body Structure / Function / Physical Skills ADL;Balance;IADL;ROM;Dexterity;Improper spinal/pelvic alignment;Mobility;Flexibility;Tone;FMC;Coordination;Decreased knowledge of precautions;Decreased knowledge of use of DME;GMC;UE functional use    Rehab Potential Good    OT Frequency 1x / week    OT Duration 12 weeks    OT Treatment/Interventions Self-care/ADL training;Moist Heat;Fluidtherapy;DME and/or AE instruction;Splinting;Therapeutic activities;Aquatic Therapy;Therapeutic exercise;Passive range of motion;Functional Mobility Training;Neuromuscular education;Paraffin;Manual Therapy;Energy conservation;Patient/family education    Plan work towards goals, ADL strategies, PWR! seated    Consulted and Agree with Plan of Care Patient           Patient will benefit from skilled therapeutic intervention in order to improve the following deficits and impairments:   Body Structure / Function /  Physical Skills: ADL,Balance,IADL,ROM,Dexterity,Improper  spinal/pelvic alignment,Mobility,Flexibility,Tone,FMC,Coordination,Decreased knowledge of precautions,Decreased knowledge of use of DME,GMC,UE functional use       Visit Diagnosis: Other symptoms and signs involving the nervous system  Abnormal posture  Other symptoms and signs involving the musculoskeletal system  Other lack of coordination  Unsteadiness on feet  Stiffness of right hand, not elsewhere classified  Stiffness of right elbow, not elsewhere classified  Stiffness of right shoulder, not elsewhere classified    Problem List Patient Active Problem List   Diagnosis Date Noted  . Encounter for hepatitis C screening test for low risk patient 08/28/2020  . Encounter for screening for HIV 08/28/2020  . Hyperlipidemia 01/10/2020  . Parkinson's disease (HCC)   . Hypertension   . Screening for colorectal cancer   . Breast cancer screening     Tiny Chaudhary 10/23/2020, 3:00 PM Keene Breath, OTR/L [phone] Phone: [phone] 3:01 PM 10/23/20 [facility] [address] [place], [place], [address] Phone: [phone]   Fax:  [phone]  Name: Teresa Logan MRN: [id number] Date of Birth: 1966/07/14

## 2020-10-23 NOTE — Patient Instructions (Addendum)
PWR! Hands  With arms stretched out in front of you (elbows straight), perform the following:  PWR! Rock: Move wrists up and down [employer]! Twist: Twist palms up and down BIG  Then, start with elbows bent and hands closed.  PWR! Step: Touch index finger to thumb while keeping other fingers straight. Flick fingers out BIG (thumb out/straighten fingers). Repeat with other fingers. (Step your thumb to each finger).  PWR! Hands: Push hands out BIG. Elbows straight, wrists up, fingers open and spread apart BIG. (Can also perform by pushing down on table, chair, knees. Push above head, out to the side, behind you, in front of you.)   ** Make each movement big and deliberate so that you feel the movement.    Coordination Exercises  Perform the following exercises for 20 minutes 1 times per day. Perform with both hand(s). Perform using big movements.   Flipping Cards: Place deck of cards on the table. Flip cards over by opening your hand big to grasp and then turn your palm up big.  Deal cards: Hold 1/2 or whole deck in your hand. Use thumb to push card off top of deck with one big push.  Pick up coins and stack one at a time: Pick up with big, intentional movements. Do not drag coin to the edge. (5-10 in a stack)  Pick up 5-10 coins one at a time and hold in palm. Then, move coins from palm to fingertips one at time and place in coin bank/container.  Practice writing: Slow down, write big, and focus on forming each letter.  Perform "Flicks"/hand stretches (PWR! Hands): Close hands then flick out your fingers with focus on opening hands, pulling wrists back, and extending elbows like you are pushing.  Perform at least 10 repetitions 1x/day, but perform PWR! hands throughout the day when you are having trouble using your hands (picking up/manipulating small objects, writing, eating, typing, sewing, buttoning, etc.).

## 2020-10-23 NOTE — Therapy (Signed)
[facility] [address] [place], [place], [address] Phone: [phone]   Fax:  [phone]  Physical Therapy Treatment  Patient Details  Name: Teresa Logan MRN: [id number] Date of Birth: 27-Sep-1966 Referring Provider (PT): Kerin Salen, DO   Encounter Date: 10/23/2020   PT End of Session - 10/23/20 1322    Visit Number 3    Number of Visits 9    Authorization Type BCBS Medicaid ([facility])    PT Start Time 1230    PT Stop Time 1312    PT Time Calculation (min) 42 min    Equipment Utilized During Treatment Gait belt    Activity Tolerance Patient tolerated treatment well    Behavior During Therapy [facility] for tasks assessed/performed           Past Medical History:  Diagnosis Date  . HTN (hypertension)   . Kidney stones   . Parkinson's disease (HCC)   . Tremors of nervous system     Past Surgical History:  Procedure Laterality Date  . TONSILLECTOMY      There were no vitals filed for this visit.   Subjective Assessment - 10/23/20 1233    Subjective No falls. Asking about the online support group. Has been doing the exercises 1-2x per day.    Pertinent History PD (formally diagnosed in 2019), HTN    Patient Stated Goals Pt's goals for therapy are to improve balance, strength, and endurance    Currently in Pain? No/denies                 NMR:   Pt performs PWR! Moves in standing position at the countertop - reviewed from previous HEP:    PWR! Up for improved posture x 10 reps - verbal and demo cues for technique, cues for opening R hand big and full R elbow extension   PWR! Rock for improved weighshifting x10 reps B, cues for big hands on R and not holding onto countertop for additional challenge for balance   PWR! Step for improved step initiation x5 reps B, pt with improved foot clearance         [facility] Adult PT Treatment/Exercise - 10/23/20 0001      Transfers    Transfers Sit to Stand;Stand to Sit    Number of Reps 2 sets;Other reps (comment)   5 reps   Comments performed without UE support, cues to scoot towards end of mat table and nose over toes      Ambulation/Gait   Ambulation/Gait Yes    Ambulation/Gait Assistance 5: Supervision    Ambulation/Gait Assistance Details throughout session between activities - cues to relax R hand with gait and intermittent tactile cues for incr R arm swing    Assistive device None    Gait Pattern Step-through pattern;Decreased arm swing - right;Decreased arm swing - left;Decreased stride length;Decreased hip/knee flexion - right;Right foot flat;Left foot flat;Poor foot clearance - right;Trunk flexed      Exercises   Exercises Other Exercises    Other Exercises  SciFit level 1.5 for 7 minutes with BUE and BLE for strengthening, ROM, reciprocal movement, cues for rpm of 40, pt rating effort level at 7-8/10, pts HR afterwards 95 bpm O2 at 100%               Balance Exercises - 10/23/20 0001      Balance Exercises: Standing   SLS with Vectors Solid surface;Upper extremity assist 1;Limitations    SLS  with Vectors Limitations standing next to countertop: alternating forward cone taps for SLS and improved foot clearance x10 reps B, then performing forward cone tap and cross body cone tap x5 reps B    Stepping Strategy Posterior;Lateral;Foam/compliant surface;10 reps;Limitations;UE support    Stepping Strategy Limitations on blue air ex: x10 reps alternating lateral stepping strategy, x10 reps alternating posterior ,cues for weight shift with UE support    Other Standing Exercises on blue air ex: heel toe raises with BUE support             PT Education - 10/23/20 1317    Education Details provided handout for local PD resouces and gave info for local online PD support group    Person(s) Educated Patient    Methods Explanation;Handout    Comprehension Verbalized understanding            PT Short Term  Goals - 10/07/20 1557      PT SHORT TERM GOAL #1   Title Pt will be indepent with initial HEP for improved overall functional strength, balance, and gait.  TARGET 11/08/2020    Baseline does not have formal HEP that she is doing    Time 4    Period Weeks    Status New      PT SHORT TERM GOAL #2   Title Pt will improve 5x sit<>Stand test to less than or equal to 15 seconds for improved functional strength.    Baseline 18.69 sec at eval    Time 4    Period Weeks    Status New      PT SHORT TERM GOAL #3   Title Pt will improve MiniBESTest score to at least 19/28 for decreased fall risk.    Baseline 16/28 at eval (<22/28 indicates increased fall risk)    Time 4    Period Weeks    Status New      PT SHORT TERM GOAL #4   Title Pt will verbalize understanding of fall prevention in home environment.    Baseline At fall risk per TUG and MiniBESTest; has had one fall    Time 4    Period Weeks    Status New      PT SHORT TERM GOAL #5   Title Pt will improve TUG and TUG cognitive score to less than 10% difference for improved dual tasking, decreased fall risk with gait.    Baseline TUG 13.59 sec, TUG cog 17.13 sec    Time 4    Period Weeks    Status New             PT Long Term Goals - 10/07/20 1601      PT LONG TERM GOAL #1   Title Pt will be independent with final HEP to build upon functional gains made in therapy. LTG TARGET 12/06/2020 (may be extended due to [facility] auth)    Baseline no formal HEP    Time 8    Period Weeks    Status New      PT LONG TERM GOAL #2   Title Pt will improve miniBEST to at least a 22/28 in order to decr fall risk.    Baseline 16/28 at eval    Time 8    Period Weeks    Status New      PT LONG TERM GOAL #3   Title Pt will improve gait speed to at least 2.62 ft/sec in order to demo improved gait efficiency and decr fall  risk.    Baseline 2.35 ft/sec at eval    Time 8    Period Weeks    Status New      PT LONG TERM GOAL #4   Title Pt  will improve 5x sit<>stand to less than or equal to 12 sec for improved functional strength and transfer efficiency.    Baseline 18.69 sec at eval    Time 8    Period Weeks    Status New      PT LONG TERM GOAL #5   Title Pt will verbalize plans for continued community fitness upon d/c from PT.    Baseline no formal community fitness    Time 8    Period Weeks    Status New                 Plan - 10/23/20 1331    Clinical Impression Statement Reviewed previous HEP (modified standing PWR) from last session with pt demonstrating improvements in foot clearance B. Performed the SciFit today for the first time for aerobic activity (pt reporting that she will be getting a gym membership in the beginning of Feburary).  Will continue to progress towards LTGs.    Personal Factors and Comorbidities Comorbidity 2    Comorbidities PD, HTN    Examination-Activity Limitations Locomotion Level;Stand;Stairs;Squat;Hygiene/Grooming;Dressing;Transfers;Caring for Others    Stability/Clinical Decision Making Evolving/Moderate complexity    Rehab Potential Good    PT Frequency 1x / week   per patient request, as PT recommends 2x/wk   PT Duration 8 weeks    PT Treatment/Interventions ADLs/Self Care Home Management;DME Instruction;Neuromuscular re-education;Balance training;Therapeutic exercise;Therapeutic activities;Functional mobility training;Stair training;Gait training;Patient/family education;Energy conservation;Manual techniques    PT Next Visit Plan add sit <> stands and standing balance to HEP. gait training. SciFit/NuStep. work on improved balance (SLS, stepping),  fall prevention    Consulted and Agree with Plan of Care Patient           Patient will benefit from skilled therapeutic intervention in order to improve the following deficits and impairments:  Abnormal gait,Decreased activity tolerance,Decreased balance,Decreased coordination,Decreased endurance,Decreased strength,Difficulty  walking,Impaired tone,Postural dysfunction  Visit Diagnosis: Other symptoms and signs involving the nervous system  Other symptoms and signs involving the musculoskeletal system  Abnormal posture  Other lack of coordination  Unsteadiness on feet     Problem List Patient Active Problem List   Diagnosis Date Noted  . Encounter for hepatitis C screening test for low risk patient 08/28/2020  . Encounter for screening for HIV 08/28/2020  . Hyperlipidemia 01/10/2020  . Parkinson's disease (HCC)   . Hypertension   . Screening for colorectal cancer   . Breast cancer screening     Drake Leach, PT, DPT  10/23/2020, 1:43 PM   [facility] [address] [place], [place], [address] Phone: [phone]   Fax:  [phone]  Name: Freddie Nghiem MRN: [id number] Date of Birth: 03/31/1966

## 2020-10-30 ENCOUNTER — Ambulatory Visit: Payer: 59 | Admitting: Occupational Therapy

## 2020-10-30 ENCOUNTER — Encounter: Payer: Self-pay | Admitting: Physical Therapy

## 2020-10-30 ENCOUNTER — Ambulatory Visit: Payer: 59 | Attending: Neurology | Admitting: Physical Therapy

## 2020-10-30 ENCOUNTER — Encounter: Payer: Self-pay | Admitting: Occupational Therapy

## 2020-10-30 ENCOUNTER — Other Ambulatory Visit: Payer: Self-pay

## 2020-10-30 DIAGNOSIS — R29898 Other symptoms and signs involving the musculoskeletal system: Secondary | ICD-10-CM | POA: Insufficient documentation

## 2020-10-30 DIAGNOSIS — M25641 Stiffness of right hand, not elsewhere classified: Secondary | ICD-10-CM | POA: Diagnosis present

## 2020-10-30 DIAGNOSIS — R251 Tremor, unspecified: Secondary | ICD-10-CM | POA: Insufficient documentation

## 2020-10-30 DIAGNOSIS — R293 Abnormal posture: Secondary | ICD-10-CM

## 2020-10-30 DIAGNOSIS — M25621 Stiffness of right elbow, not elsewhere classified: Secondary | ICD-10-CM | POA: Diagnosis present

## 2020-10-30 DIAGNOSIS — R278 Other lack of coordination: Secondary | ICD-10-CM | POA: Diagnosis present

## 2020-10-30 DIAGNOSIS — M6281 Muscle weakness (generalized): Secondary | ICD-10-CM

## 2020-10-30 DIAGNOSIS — M25611 Stiffness of right shoulder, not elsewhere classified: Secondary | ICD-10-CM | POA: Diagnosis present

## 2020-10-30 DIAGNOSIS — R2681 Unsteadiness on feet: Secondary | ICD-10-CM | POA: Insufficient documentation

## 2020-10-30 DIAGNOSIS — R29818 Other symptoms and signs involving the nervous system: Secondary | ICD-10-CM

## 2020-10-30 NOTE — Patient Instructions (Signed)
Access Code: [email or web address] URL: [email or web address] Date: 10/30/2020 Prepared by: Sherlie Ban  Exercises Sit to Stand - 2 x daily - 5 x weekly - 1 sets - 10 reps

## 2020-10-30 NOTE — Patient Instructions (Signed)
Compensation Strategies for Tremors  When eating, try the following Eat out of bowls, divided plates, or use a plate guard (available at a medical supply store) and eat with a spoon so that you have an edge to scoop up food. Try raising your plate so that there is less distance between the plate and mouth.Try stabilizing elbows on the tables or against your body. Use utensil with built-up/larger grips as they are easier to hold.  When writing, try the following: Stabilize forearm on the table. Take your time as rushing/being stressed can increase tremors. Try a felt-tipped pen, it does not glide as much.  Avoid gel pens ( they move to much ). Consider using pre-printed labels with your name and address (carry them with you when you go out) or you can get stamps with your address or signature on it. Use a small tape recorder to record messages/reminders for yourself. Use pens with bigger grips.  When brushing your teeth, putting on make-up, or styling hair, try the following: Use an electric toothbrush. Use items with built-up grips. Stabilize your elbows against your body or on the counter. Use long-handled brushes/combs. Use a hair dryer with a stand.  In general: Avoid stress, fatigue or rushing as this can increase tremors. Sit down for activities that require more control/coordination. Perform "flicks".  

## 2020-10-30 NOTE — Therapy (Signed)
[facility] [address] [place], [place], [address] Phone: [phone]   Fax:  [phone]  Occupational Therapy Treatment  Patient Details  Name: Teresa Logan MRN: [id number] Date of Birth: November 28, 1965 Referring Provider (OT): Dr. Lurena Joiner Tat    Encounter Date: 10/30/2020   OT End of Session - 10/30/20 1411    Visit Number 3    Number of Visits 12    Date for OT Re-Evaluation 01/05/21    Authorization Type BCBS medicaid    Authorization Time Period through 12/04/20    Authorization - Visit Number 2    Authorization - Number of Visits 12    OT Start Time 1317    OT Stop Time 1400    OT Time Calculation (min) 43 min           Past Medical History:  Diagnosis Date  . HTN (hypertension)   . Kidney stones   . Parkinson's disease (HCC)   . Tremors of nervous system     Past Surgical History:  Procedure Laterality Date  . TONSILLECTOMY      There were no vitals filed for this visit.   Subjective Assessment - 10/30/20 1320    Subjective  Denies pain    Pertinent History Young Onset Parkinson's disease (formally diagnosed 2019).  PMH:  HTN, HLD, ?hx of essential tremor diagnosis    Patient Stated Goals increase strength and use of R hand    Currently in Pain? No/denies                                OT Education - 10/30/20 1412    Education Details compensatory strategies for tremors, handwriting strategies with practice and handout, foam grip issued for pen and utensils, big movements with ADLs/ Strategies for cutting food, opening containers, scooping food, writing    Person(s) Educated Patient    Methods Explanation;Demonstration;Verbal cues;Handout    Comprehension Returned demonstration;Verbal cues required;Verbalized understanding            OT Short Term Goals - 10/30/20 1320      OT SHORT TERM GOAL #1   Title Pt will be independent with PD-specific HEP.     Baseline issued last admission, but will need review    Time 6    Period Weeks    Status On-going      OT SHORT TERM GOAL #2   Title Pt will be independent with R hand splint wear/care for improved R hand positioning, decr risk of contracture, and manage rigidity.    Baseline no splint, pt with decr MP ext and thumb abduction and moderate rigidity    Time 6    Period Weeks    Status New      OT SHORT TERM GOAL #3   Title Pt will improve BUE coordination/functional reaching for ADLs as shown by improving score on box and blocks test by at least 5 blocks bilaterally.    Baseline R-23, L-30 blocks    Time 6    Period Weeks    Status New      OT SHORT TERM GOAL #4   Title Pt will assist grandchildren in dressing    Baseline dependent    Time 6    Period Weeks    Status New      OT SHORT TERM GOAL #5   Title Pt will demo improved coordination for ADLs as  shown by improving time on 9-hole peg test by at least 5 sec bilaterally.    Baseline R-54.31 sec sec, L-40.72 sec    Time 6    Period Weeks    Status New      OT SHORT TERM GOAL #6   Title Pt will be able to do simple maintenance/meal prep tasks with min assist    Baseline children performing    Time 6    Period Weeks    Status New             OT Long Term Goals - 10/23/20 1333      OT LONG TERM GOAL #1   Title Pt will verbalize understanding of adaptive strategies to incr ease, safety, and independence/participation with ADLs/IADLs.-    Baseline dependent    Time 12    Period Weeks    Status New      OT LONG TERM GOAL #2   Title Pt will improve RUE coordination/functional reaching for ADLs as shown by improving score on box and blocks test by at least 10 blocks bilaterally.    Baseline R-23 blocks    Time 12    Period Weeks    Status New      OT LONG TERM GOAL #3   Title Pt to write 2 sentences maintaining 80% or greater legibility and letter size    Baseline name only 90%    Time 12    Period Weeks     Status New      OT LONG TERM GOAL #4   Title Pt will demo improved ease/decr bradykinesia for with dressing as shown by improving time on PPT#4 (donning/doffing jacket) by at least 10 sec.    Baseline 52.44 sec    Time 12    Period Weeks    Status New      OT LONG TERM GOAL #5   Title Pt will be able to use dominant RUE for bathing and eating at least 25% of the time.    Baseline only using LUE currently    Time 12    Period Weeks    Status New      OT LONG TERM GOAL #6   Title Pt will verbalize understanding of ways to decr risk of future complications related to PD and appropriate community resources.    Baseline dependent    Time 12    Period Weeks    Status New                 Plan - 10/30/20 1354    Clinical Impression Statement Pt is progressing towards goals.she verbalizes understanding of handwriting strategeis and strategies to compensate for tremors.    OT Occupational Profile and History Problem Focused Assessment - Including review of records relating to presenting problem    Occupational performance deficits (Please refer to evaluation for details): ADL's;IADL's    Body Structure / Function / Physical Skills ADL;Balance;IADL;ROM;Dexterity;Improper spinal/pelvic alignment;Mobility;Flexibility;Tone;FMC;Coordination;Decreased knowledge of precautions;Decreased knowledge of use of DME;GMC;UE functional use    Rehab Potential Good    Clinical Decision Making Several treatment options, min-mod task modification necessary    Comorbidities Affecting Occupational Performance: May have comorbidities impacting occupational performance    OT Frequency 1x / week    OT Duration 12 weeks    OT Treatment/Interventions Self-care/ADL training;Moist Heat;Fluidtherapy;DME and/or AE instruction;Splinting;Therapeutic activities;Aquatic Therapy;Therapeutic exercise;Passive range of motion;Functional Mobility Training;Neuromuscular education;Paraffin;Manual Therapy;Energy  conservation;Patient/family education    Plan issue big movements with ADLs  handout, PWR! moves seated, assess splinting needs           Patient will benefit from skilled therapeutic intervention in order to improve the following deficits and impairments:   Body Structure / Function / Physical Skills: ADL,Balance,IADL,ROM,Dexterity,Improper spinal/pelvic alignment,Mobility,Flexibility,Tone,FMC,Coordination,Decreased knowledge of precautions,Decreased knowledge of use of DME,GMC,UE functional use       Visit Diagnosis: Other symptoms and signs involving the nervous system  Abnormal posture  Other symptoms and signs involving the musculoskeletal system  Unsteadiness on feet  Muscle weakness (generalized)  Other lack of coordination  Stiffness of right hand, not elsewhere classified  Stiffness of right elbow, not elsewhere classified    Problem List Patient Active Problem List   Diagnosis Date Noted  . Encounter for hepatitis C screening test for low risk patient 08/28/2020  . Encounter for screening for HIV 08/28/2020  . Hyperlipidemia 01/10/2020  . Parkinson's disease (HCC)   . Hypertension   . Screening for colorectal cancer   . Breast cancer screening     Arpita Fentress 10/30/2020, 2:15 PM Keene Breath, OTR/L [phone] Phone: [phone] 2:15 PM 10/30/20 [facility] Rehabilitation [facility] [address] [place], [place], [address] Phone: [phone]   Fax:  [phone]  Name: Teresa Logan MRN: [id number] Date of Birth: Feb 06, 1966

## 2020-10-30 NOTE — Therapy (Signed)
[facility] [address] [place], [place], [address] Phone: [phone]   Fax:  [phone]  Physical Therapy Treatment  Patient Details  Name: Teresa Logan MRN: [id number] Date of Birth: 1966-04-18 Referring Provider (PT): Kerin Salen, DO   Encounter Date: 10/30/2020   PT End of Session - 10/30/20 1319    Visit Number 4    Number of Visits 9    Authorization Type BCBS Medicaid ([facility] [place]) - auth for 8 PT visits 10/09/20-12/04/20    PT Start Time 1233    PT Stop Time 1312    PT Time Calculation (min) 39 min    Equipment Utilized During Treatment Gait belt    Activity Tolerance Patient tolerated treatment well    Behavior During Therapy [facility] for tasks assessed/performed           Past Medical History:  Diagnosis Date  . HTN (hypertension)   . Kidney stones   . Parkinson's disease (HCC)   . Tremors of nervous system     Past Surgical History:  Procedure Laterality Date  . TONSILLECTOMY      There were no vitals filed for this visit.   Subjective Assessment - 10/30/20 1235    Subjective Going up to [facility] in a couple weeks. No falls. Has been pretty achy the past couple of days - thinks its because the rain is coming.    Pertinent History PD (formally diagnosed in 2019), HTN    Patient Stated Goals Pt's goals for therapy are to improve balance, strength, and endurance    Currently in Pain? No/denies                             [facility] Adult PT Treatment/Exercise - 10/30/20 0001      Transfers   Comments 10 reps with chair without UE support - added to pt's HEP      Ambulation/Gait   Ambulation/Gait Yes    Ambulation/Gait Assistance 5: Supervision    Ambulation/Gait Assistance Details 1 lap focusing on incr stride length and heel strike, an additional 2 laps with gait with walking poles and therapist helping to facilitate arm swing and trunk rotation. Pt with improved foot  clearance with RLE.    Ambulation Distance (Feet) 345 Feet    Assistive device None    Gait Pattern Step-through pattern;Decreased arm swing - right;Decreased arm swing - left;Decreased stride length;Decreased hip/knee flexion - right;Right foot flat;Left foot flat;Poor foot clearance - right;Trunk flexed               Balance Exercises - 10/30/20 0001      Balance Exercises: Standing   SLS Eyes open;Solid surface    SLS Limitations alternating toe taps to 6" step x10 reps B, cues for incr foot clearance and picking foot up from step vs. scuffing    Other Standing Exercises multi directional stepping following direction sheet: having yard sticks to step over in forward and lateral directions, cued to read directions out loud when stepping, cues for wider BOS when returning to midline    Other Standing Exercises Comments wide BOS trunk rotation in corner reaching to sticky note targets -x5 reps B lateral reaching, x5 reps B superior/lateral reaching, cues to pivot on feet           NMR:  Pt performs PWR! Moves in standingpositionat the countertop:   PWR! Up for improved posturex 10 reps -  cues for opening R hand big and full R elbow extension  PWR! Rock for improved weighshiftingx10 reps B, cues for big hands on R and not holding onto countertop for additional challenge for balance   PWR! Step for improved step initiationx5 reps B, pt with improved foot clearance    PT Education - 10/30/20 1319    Education Details gait training, importance of performing HEP, sit <> stands addition to HEP    Person(s) Educated Patient    Methods Explanation;Handout;Demonstration    Comprehension Verbalized understanding;Returned demonstration            PT Short Term Goals - 10/07/20 1557      PT SHORT TERM GOAL #1   Title Pt will be indepent with initial HEP for improved overall functional strength, balance, and gait.  TARGET 11/08/2020    Baseline does not have formal HEP that  she is doing    Time 4    Period Weeks    Status New      PT SHORT TERM GOAL #2   Title Pt will improve 5x sit<>Stand test to less than or equal to 15 seconds for improved functional strength.    Baseline 18.69 sec at eval    Time 4    Period Weeks    Status New      PT SHORT TERM GOAL #3   Title Pt will improve MiniBESTest score to at least 19/28 for decreased fall risk.    Baseline 16/28 at eval (<22/28 indicates increased fall risk)    Time 4    Period Weeks    Status New      PT SHORT TERM GOAL #4   Title Pt will verbalize understanding of fall prevention in home environment.    Baseline At fall risk per TUG and MiniBESTest; has had one fall    Time 4    Period Weeks    Status New      PT SHORT TERM GOAL #5   Title Pt will improve TUG and TUG cognitive score to less than 10% difference for improved dual tasking, decreased fall risk with gait.    Baseline TUG 13.59 sec, TUG cog 17.13 sec    Time 4    Period Weeks    Status New             PT Long Term Goals - 10/07/20 1601      PT LONG TERM GOAL #1   Title Pt will be independent with final HEP to build upon functional gains made in therapy. LTG TARGET 12/06/2020 (may be extended due to [facility] auth)    Baseline no formal HEP    Time 8    Period Weeks    Status New      PT LONG TERM GOAL #2   Title Pt will improve miniBEST to at least a 22/28 in order to decr fall risk.    Baseline 16/28 at eval    Time 8    Period Weeks    Status New      PT LONG TERM GOAL #3   Title Pt will improve gait speed to at least 2.62 ft/sec in order to demo improved gait efficiency and decr fall risk.    Baseline 2.35 ft/sec at eval    Time 8    Period Weeks    Status New      PT LONG TERM GOAL #4   Title Pt will improve 5x sit<>stand to less than or  equal to 12 sec for improved functional strength and transfer efficiency.    Baseline 18.69 sec at eval    Time 8    Period Weeks    Status New      PT LONG TERM GOAL #5    Title Pt will verbalize plans for continued community fitness upon d/c from PT.    Baseline no formal community fitness    Time 8    Period Weeks    Status New                 Plan - 10/30/20 1321    Clinical Impression Statement Performed gait training today with focus on incr stride length and heel strike with pt able to demonstrate improved RLE foot clearance throughout session. Also performed 1.5 laps with therapist holding walking poles behind pt to help facilitate trunk rotation/arm swing, with pt able to maintain for small distances (although RUE arm swing decr) after poles removed. Seated rest breaks taken intermittently throughout session. Will continue to progress towards LTGs.    Personal Factors and Comorbidities Comorbidity 2    Comorbidities PD, HTN    Examination-Activity Limitations Locomotion Level;Stand;Stairs;Squat;Hygiene/Grooming;Dressing;Transfers;Caring for Others    Stability/Clinical Decision Making Evolving/Moderate complexity    Rehab Potential Good    PT Frequency 1x / week   per patient request, as PT recommends 2x/wk   PT Duration 8 weeks    PT Treatment/Interventions ADLs/Self Care Home Management;DME Instruction;Neuromuscular re-education;Balance training;Therapeutic exercise;Therapeutic activities;Functional mobility training;Stair training;Gait training;Patient/family education;Energy conservation;Manual techniques    PT Next Visit Plan need to check STGs. continue gait training. SciFit/NuStep. work on improved balance (SLS, stepping),  fall prevention    Consulted and Agree with Plan of Care Patient           Patient will benefit from skilled therapeutic intervention in order to improve the following deficits and impairments:  Abnormal gait,Decreased activity tolerance,Decreased balance,Decreased coordination,Decreased endurance,Decreased strength,Difficulty walking,Impaired tone,Postural dysfunction  Visit Diagnosis: Other symptoms and signs  involving the nervous system  Abnormal posture  Other symptoms and signs involving the musculoskeletal system  Unsteadiness on feet  Muscle weakness (generalized)     Problem List Patient Active Problem List   Diagnosis Date Noted  . Encounter for hepatitis C screening test for low risk patient 08/28/2020  . Encounter for screening for HIV 08/28/2020  . Hyperlipidemia 01/10/2020  . Parkinson's disease (HCC)   . Hypertension   . Screening for colorectal cancer   . Breast cancer screening     Drake Leach, PT, DPT  10/30/2020, 1:24 PM  [facility] [address] [place], [place], [address] Phone: [phone]   Fax:  [phone]  Name: Teresa Logan MRN: [id number] Date of Birth: 1966-07-01

## 2020-11-06 ENCOUNTER — Ambulatory Visit: Payer: 59 | Admitting: Physical Therapy

## 2020-11-06 ENCOUNTER — Encounter: Payer: Self-pay | Admitting: Occupational Therapy

## 2020-11-06 ENCOUNTER — Other Ambulatory Visit: Payer: Self-pay

## 2020-11-06 ENCOUNTER — Ambulatory Visit: Payer: 59 | Admitting: Occupational Therapy

## 2020-11-06 ENCOUNTER — Encounter: Payer: Self-pay | Admitting: Physical Therapy

## 2020-11-06 DIAGNOSIS — R29818 Other symptoms and signs involving the nervous system: Secondary | ICD-10-CM | POA: Diagnosis not present

## 2020-11-06 DIAGNOSIS — M25611 Stiffness of right shoulder, not elsewhere classified: Secondary | ICD-10-CM

## 2020-11-06 DIAGNOSIS — M25641 Stiffness of right hand, not elsewhere classified: Secondary | ICD-10-CM

## 2020-11-06 DIAGNOSIS — M6281 Muscle weakness (generalized): Secondary | ICD-10-CM

## 2020-11-06 DIAGNOSIS — R2681 Unsteadiness on feet: Secondary | ICD-10-CM

## 2020-11-06 DIAGNOSIS — R293 Abnormal posture: Secondary | ICD-10-CM

## 2020-11-06 DIAGNOSIS — R278 Other lack of coordination: Secondary | ICD-10-CM

## 2020-11-06 DIAGNOSIS — R29898 Other symptoms and signs involving the musculoskeletal system: Secondary | ICD-10-CM

## 2020-11-06 DIAGNOSIS — M25621 Stiffness of right elbow, not elsewhere classified: Secondary | ICD-10-CM

## 2020-11-06 NOTE — Therapy (Signed)
[facility] [address] [place], [place], [address] Phone: [phone]   Fax:  [phone]  Physical Therapy Treatment  Patient Details  Name: Teresa Logan MRN: [id number] Date of Birth: Feb 20, 1966 Referring Provider (PT): Kerin Salen, DO   Encounter Date: 11/06/2020   PT End of Session - 11/06/20 2052    Visit Number 5    Number of Visits 9    Authorization Type BCBS Medicaid ([facility] [place]) - auth for 8 PT visits 10/09/20-12/04/20    PT Start Time 1317   4 minutes non billable due to pt using restroom   PT Stop Time 1359    PT Time Calculation (min) 42 min    Activity Tolerance Patient tolerated treatment well    Behavior During Therapy [facility] for tasks assessed/performed           Past Medical History:  Diagnosis Date  . HTN (hypertension)   . Kidney stones   . Parkinson's disease (HCC)   . Tremors of nervous system     Past Surgical History:  Procedure Laterality Date  . TONSILLECTOMY      There were no vitals filed for this visit.   Subjective Assessment - 11/06/20 1320    Subjective Pt reports her sister passed on Feb 14, 2023 morning. Reports she is really tired and worn out. Is up for participating in therapy today and see what she can do- would like to stay in private treatment room.    Pertinent History PD (formally diagnosed in 2019), HTN    Patient Stated Goals Pt's goals for therapy are to improve balance, strength, and endurance    Currently in Pain? Yes    Pain Score 4     Pain Location Back    Pain Orientation Right;Lower    Pain Descriptors / Indicators Aching;Sore    Pain Type Acute pain    Aggravating Factors  stress    Pain Relieving Factors stretching                      Pt performs PWR! Moves in standing position    PWR! Up for improved posture 2 x 10 reps - with use of boomwhackers to whack red ball in chair anteriorly when performing mini squat and then up with  tall posture, initial verbal cues for technique   PWR! Twist for improved trunk rotation x10 reps B - with red physioball in chair to either pt's R or L, practiced single arm extension and trunk rotation to whack the ball with a boomwhacker, cues for tall posture when coming back to midline and pivoting on foot.   Cues provided for intensity of movement and technique.          [facility] Adult PT Treatment/Exercise - 11/06/20 1331      Exercises   Exercises Other Exercises    Other Exercises  performed supine trunk rotations with initial cues for technique to help with back pain and to perform at home x10 reps, pt reporting feeling good afterwards. Also performed seated trunk rotations x6 reps B with 10-15 second holds, provided pt with handout of pic of exercise to perform at home.               Balance Exercises - 11/06/20 0001      Balance Exercises: Standing   Stepping Strategy Anterior;Limitations;10 reps    Stepping Strategy Limitations forward stepping x12 reps B while tossing scarf into the air for posture/opening  up hand/full body movement, cues for incr foot clearance when stepping back to midline    Other Standing Exercises on blue air ex: heel toe raises with BUE support              PT Short Term Goals - 10/07/20 1557      PT SHORT TERM GOAL #1   Title Pt will be indepent with initial HEP for improved overall functional strength, balance, and gait.  TARGET 11/08/2020    Baseline does not have formal HEP that she is doing    Time 4    Period Weeks    Status New      PT SHORT TERM GOAL #2   Title Pt will improve 5x sit<>Stand test to less than or equal to 15 seconds for improved functional strength.    Baseline 18.69 sec at eval    Time 4    Period Weeks    Status New      PT SHORT TERM GOAL #3   Title Pt will improve MiniBESTest score to at least 19/28 for decreased fall risk.    Baseline 16/28 at eval (<22/28 indicates increased fall risk)    Time 4     Period Weeks    Status New      PT SHORT TERM GOAL #4   Title Pt will verbalize understanding of fall prevention in home environment.    Baseline At fall risk per TUG and MiniBESTest; has had one fall    Time 4    Period Weeks    Status New      PT SHORT TERM GOAL #5   Title Pt will improve TUG and TUG cognitive score to less than 10% difference for improved dual tasking, decreased fall risk with gait.    Baseline TUG 13.59 sec, TUG cog 17.13 sec    Time 4    Period Weeks    Status New             PT Long Term Goals - 10/07/20 1601      PT LONG TERM GOAL #1   Title Pt will be independent with final HEP to build upon functional gains made in therapy. LTG TARGET 12/06/2020 (may be extended due to [facility] auth)    Baseline no formal HEP    Time 8    Period Weeks    Status New      PT LONG TERM GOAL #2   Title Pt will improve miniBEST to at least a 22/28 in order to decr fall risk.    Baseline 16/28 at eval    Time 8    Period Weeks    Status New      PT LONG TERM GOAL #3   Title Pt will improve gait speed to at least 2.62 ft/sec in order to demo improved gait efficiency and decr fall risk.    Baseline 2.35 ft/sec at eval    Time 8    Period Weeks    Status New      PT LONG TERM GOAL #4   Title Pt will improve 5x sit<>stand to less than or equal to 12 sec for improved functional strength and transfer efficiency.    Baseline 18.69 sec at eval    Time 8    Period Weeks    Status New      PT LONG TERM GOAL #5   Title Pt will verbalize plans for continued community fitness upon d/c from PT.  Baseline no formal community fitness    Time 8    Period Weeks    Status New                 Plan - 11/06/20 2054    Clinical Impression Statement Pt with recent death in the family, not feeling the best, but still wishing to participate in therapy. Deferred STGs at this time - will check at next visit. Today's skilled session focused on PWR moves with incr intensity  movements and standing balance strategies. Pt tolerated session well and reported feeling better at the end. Seated rest breaks taken intermittently througout session.    Personal Factors and Comorbidities Comorbidity 2    Comorbidities PD, HTN    Examination-Activity Limitations Locomotion Level;Stand;Stairs;Squat;Hygiene/Grooming;Dressing;Transfers;Caring for Others    Stability/Clinical Decision Making Evolving/Moderate complexity    Rehab Potential Good    PT Frequency 1x / week   per patient request, as PT recommends 2x/wk   PT Duration 8 weeks    PT Treatment/Interventions ADLs/Self Care Home Management;DME Instruction;Neuromuscular re-education;Balance training;Therapeutic exercise;Therapeutic activities;Functional mobility training;Stair training;Gait training;Patient/family education;Energy conservation;Manual techniques    PT Next Visit Plan need to check STGs. continue gait training. SciFit/NuStep. work on improved balance (SLS, stepping),  fall prevention    Consulted and Agree with Plan of Care Patient           Patient will benefit from skilled therapeutic intervention in order to improve the following deficits and impairments:  Abnormal gait,Decreased activity tolerance,Decreased balance,Decreased coordination,Decreased endurance,Decreased strength,Difficulty walking,Impaired tone,Postural dysfunction  Visit Diagnosis: Other symptoms and signs involving the nervous system  Abnormal posture  Other symptoms and signs involving the musculoskeletal system  Unsteadiness on feet  Muscle weakness (generalized)     Problem List Patient Active Problem List   Diagnosis Date Noted  . Encounter for hepatitis C screening test for low risk patient 08/28/2020  . Encounter for screening for HIV 08/28/2020  . Hyperlipidemia 01/10/2020  . Parkinson's disease (HCC)   . Hypertension   . Screening for colorectal cancer   . Breast cancer screening     Drake Leach , PT, DPT   11/06/2020, 8:57 PM   [facility] [address] [place], [place], [address] Phone: [phone]   Fax:  [phone]  Name: Teresa Logan MRN: [id number] Date of Birth: 1966/09/03

## 2020-11-06 NOTE — Therapy (Signed)
[facility] [address] [place], [place], [address] Phone: [phone]   Fax:  [phone]  Occupational Therapy Treatment  Patient Details  Name: Teresa Logan MRN: [id number] Date of Birth: 04-Nov-1965 Referring Provider (OT): Dr. Lurena Joiner Tat    Encounter Date: 11/06/2020   OT End of Session - 11/06/20 1246    Visit Number 4    Number of Visits 12    Date for OT Re-Evaluation 01/05/21    Authorization Type BCBS medicaid    Authorization Time Period through 12/04/20    Authorization - Visit Number 3    Authorization - Number of Visits 12    Progress Note Due on Visit 10    OT Start Time 1235    OT Stop Time 1315    OT Time Calculation (min) 40 min           Past Medical History:  Diagnosis Date  . HTN (hypertension)   . Kidney stones   . Parkinson's disease (HCC)   . Tremors of nervous system     Past Surgical History:  Procedure Laterality Date  . TONSILLECTOMY      There were no vitals filed for this visit.        Treatment: copying small peg design on vertical surface with RUE in standing for fine motor coordination and RUE functional use, min v.c for larger amplitude movements                 OT Education - 11/06/20 1306    Education Details PWR! moves seated basic 4, 10 reps each, min v.c, big movements with ADLS handout issued    Person(s) Educated Patient    Methods Explanation;Demonstration;Verbal cues;Handout    Comprehension Returned demonstration;Verbal cues required;Verbalized understanding            OT Short Term Goals - 11/06/20 1255      OT SHORT TERM GOAL #1   Title Pt will be independent with PD-specific HEP.    Baseline issued last admission, but will need review    Time 6    Period Weeks    Status On-going      OT SHORT TERM GOAL #2   Title Pt will be independent with R hand splint wear/care for improved R hand positioning, decr risk of  contracture, and manage rigidity.    Baseline no splint, pt with decr MP ext and thumb abduction and moderate rigidity    Time 6    Period Weeks    Status On-going      OT SHORT TERM GOAL #3   Title Pt will improve BUE coordination/functional reaching for ADLs as shown by improving score on box and blocks test by at least 5 blocks bilaterally.    Baseline R-23, L-30 blocks    Time 6    Period Weeks    Status On-going      OT SHORT TERM GOAL #4   Title Pt will assist grandchildren in dressing    Baseline dependent    Time 6    Period Weeks    Status New      OT SHORT TERM GOAL #5   Title Pt will demo improved coordination for ADLs as shown by improving time on 9-hole peg test by at least 5 sec bilaterally.    Baseline R-54.31 sec sec, L-40.72 sec    Time 6    Period Weeks    Status On-going  OT SHORT TERM GOAL #6   Title Pt will be able to do simple maintenance/meal prep tasks with min assist    Baseline children performing    Time 6    Period Weeks    Status Achieved   min A            OT Long Term Goals - 10/23/20 1333      OT LONG TERM GOAL #1   Title Pt will verbalize understanding of adaptive strategies to incr ease, safety, and independence/participation with ADLs/IADLs.-    Baseline dependent    Time 12    Period Weeks    Status New      OT LONG TERM GOAL #2   Title Pt will improve RUE coordination/functional reaching for ADLs as shown by improving score on box and blocks test by at least 10 blocks bilaterally.    Baseline R-23 blocks    Time 12    Period Weeks    Status New      OT LONG TERM GOAL #3   Title Pt to write 2 sentences maintaining 80% or greater legibility and letter size    Baseline name only 90%    Time 12    Period Weeks    Status New      OT LONG TERM GOAL #4   Title Pt will demo improved ease/decr bradykinesia for with dressing as shown by improving time on PPT#4 (donning/doffing jacket) by at least 10 sec.    Baseline 52.44  sec    Time 12    Period Weeks    Status New      OT LONG TERM GOAL #5   Title Pt will be able to use dominant RUE for bathing and eating at least 25% of the time.    Baseline only using LUE currently    Time 12    Period Weeks    Status New      OT LONG TERM GOAL #6   Title Pt will verbalize understanding of ways to decr risk of future complications related to PD and appropriate community resources.    Baseline dependent    Time 12    Period Weeks    Status New                 Plan - 11/06/20 1248    Clinical Impression Statement Pt is progressing towards goals. She demonstrates understanding of PWR! moves seated .    OT Occupational Profile and History Problem Focused Assessment - Including review of records relating to presenting problem    Occupational performance deficits (Please refer to evaluation for details): ADL's;IADL's    Body Structure / Function / Physical Skills ADL;Balance;IADL;ROM;Dexterity;Improper spinal/pelvic alignment;Mobility;Flexibility;Tone;FMC;Coordination;Decreased knowledge of precautions;Decreased knowledge of use of DME;GMC;UE functional use    Rehab Potential Good    Clinical Decision Making Several treatment options, min-mod task modification necessary    Comorbidities Affecting Occupational Performance: May have comorbidities impacting occupational performance    OT Frequency 1x / week    OT Duration 12 weeks    OT Treatment/Interventions Self-care/ADL training;Moist Heat;Fluidtherapy;DME and/or AE instruction;Splinting;Therapeutic activities;Aquatic Therapy;Therapeutic exercise;Passive range of motion;Functional Mobility Training;Neuromuscular education;Paraffin;Manual Therapy;Energy conservation;Patient/family education    Plan big movements with ADLS    Consulted and Agree with Plan of Care Patient           Patient will benefit from skilled therapeutic intervention in order to improve the following deficits and impairments:   Body  Structure / Function /  Physical Skills: ADL,Balance,IADL,ROM,Dexterity,Improper spinal/pelvic alignment,Mobility,Flexibility,Tone,FMC,Coordination,Decreased knowledge of precautions,Decreased knowledge of use of DME,GMC,UE functional use       Visit Diagnosis: Other symptoms and signs involving the nervous system  Abnormal posture  Other symptoms and signs involving the musculoskeletal system  Unsteadiness on feet  Muscle weakness (generalized)  Other lack of coordination  Stiffness of right hand, not elsewhere classified  Stiffness of right elbow, not elsewhere classified  Stiffness of right shoulder, not elsewhere classified    Problem List Patient Active Problem List   Diagnosis Date Noted  . Encounter for hepatitis C screening test for low risk patient 08/28/2020  . Encounter for screening for HIV 08/28/2020  . Hyperlipidemia 01/10/2020  . Parkinson's disease (HCC)   . Hypertension   . Screening for colorectal cancer   . Breast cancer screening     Ugochukwu Chichester 11/06/2020, 1:09 PM  [facility] [address] [place], [place], [address] Phone: [phone]   Fax:  [phone]  Name: Teresa Logan MRN: [id number] Date of Birth: [birth year]

## 2020-11-06 NOTE — Patient Instructions (Signed)

## 2020-11-06 NOTE — Patient Instructions (Signed)
Sitting With Rotation    Sit on edge of seat with one hand on opposite knee, other on chair. Keep knees parallel. Pull with hand on knee and, if needed, push with hand on chair to rotate trunk to that side. Hold _10__ seconds. Repeat _10__ times each side. Do __2_ sessions per day.  Copyright  VHI. All rights reserved.

## 2020-11-13 ENCOUNTER — Ambulatory Visit: Payer: 59 | Admitting: Occupational Therapy

## 2020-11-13 ENCOUNTER — Encounter: Payer: Self-pay | Admitting: Occupational Therapy

## 2020-11-13 ENCOUNTER — Ambulatory Visit: Payer: 59 | Admitting: Physical Therapy

## 2020-11-13 ENCOUNTER — Other Ambulatory Visit: Payer: Self-pay

## 2020-11-13 DIAGNOSIS — R278 Other lack of coordination: Secondary | ICD-10-CM

## 2020-11-13 DIAGNOSIS — R29818 Other symptoms and signs involving the nervous system: Secondary | ICD-10-CM

## 2020-11-13 DIAGNOSIS — M25641 Stiffness of right hand, not elsewhere classified: Secondary | ICD-10-CM

## 2020-11-13 DIAGNOSIS — R293 Abnormal posture: Secondary | ICD-10-CM

## 2020-11-13 DIAGNOSIS — M25611 Stiffness of right shoulder, not elsewhere classified: Secondary | ICD-10-CM

## 2020-11-13 DIAGNOSIS — M6281 Muscle weakness (generalized): Secondary | ICD-10-CM

## 2020-11-13 DIAGNOSIS — R2681 Unsteadiness on feet: Secondary | ICD-10-CM

## 2020-11-13 DIAGNOSIS — M25621 Stiffness of right elbow, not elsewhere classified: Secondary | ICD-10-CM

## 2020-11-13 DIAGNOSIS — R29898 Other symptoms and signs involving the musculoskeletal system: Secondary | ICD-10-CM

## 2020-11-13 DIAGNOSIS — R251 Tremor, unspecified: Secondary | ICD-10-CM

## 2020-11-13 NOTE — Therapy (Signed)
[facility] [address] [place], [place], [address] Phone: [phone]   Fax:  [phone]  Occupational Therapy Treatment  Patient Details  Name: Teresa Logan MRN: [id number] Date of Birth: Mar 18, 1966 Referring Provider (OT): Dr. Wells Guiles Tat    Encounter Date: 11/13/2020   OT End of Session - 11/13/20 1320    Visit Number 5    Number of Visits 12    Date for OT Re-Evaluation 01/05/21    Authorization Type BCBS medicaid, pt is now approved for Medicare [facility]    Authorization Time Period through 12/04/20    Authorization - Visit Number 5   corrected count   Authorization - Number of Visits 12   will need PN on visit 10, pt has MCD now   Progress Note Due on Visit 10    OT Start Time 1320    OT Stop Time 1400    OT Time Calculation (min) 40 min    Activity Tolerance Patient tolerated treatment well    Behavior During Therapy [facility] for tasks assessed/performed           Past Medical History:  Diagnosis Date  . HTN (hypertension)   . Kidney stones   . Parkinson's disease ([place])   . Tremors of nervous system     Past Surgical History:  Procedure Laterality Date  . TONSILLECTOMY      There were no vitals filed for this visit.   Subjective Assessment - 11/13/20 1320    Subjective  Pt reports she had a nice Valentine's day    Pertinent History Young Onset Parkinson's disease (formally diagnosed 2019).  PMH:  HTN, HLD, ?hx of essential tremor diagnosis    Patient Stated Goals increase strength and use of R hand    Currently in Pain? No/denies                  Treatment:PWR! Hands 10 reps each, basic 4, min v.c Checked short term goals, pt demo good progress.              OT Education - 11/13/20 1344    Education Details reviewed coordination HEP, min v.c, PWR1 hands basic 4, checked progress towards short term goals- pt is progressing    Person(s) Educated Patient    Methods  Explanation;Demonstration;Verbal cues    Comprehension Returned demonstration;Verbal cues required;Verbalized understanding            OT Short Term Goals - 11/13/20 1323      OT SHORT TERM GOAL #1   Title Pt will be independent with PD-specific HEP.    Baseline issued last admission, but will need review    Time 6    Period Weeks    Status On-going      OT SHORT TERM GOAL #2   Title Pt will be independent with R hand splint wear/care for improved R hand positioning, decr risk of contracture, and manage rigidity.    Baseline no splint, pt with decr MP ext and thumb abduction and moderate rigidity    Time 6    Period Weeks    Status Deferred      OT SHORT TERM GOAL #3   Title Pt will improve BUE coordination/functional reaching for ADLs as shown by improving score on box and blocks test by at least 5 blocks bilaterally.    Baseline R-23, L-30 blocks    Time 6    Period Weeks    Status Achieved  RUE 38 blocks LUE 44 blocks     OT SHORT TERM GOAL #4   Title Pt will assist grandchildren in dressing    Baseline dependent    Time 6    Period Weeks    Status On-going   pt has not seen granchildren     OT SHORT TERM GOAL #5   Title Pt will demo improved coordination for ADLs as shown by improving time on 9-hole peg test by at least 5 sec bilaterally.    Baseline R-54.31 sec sec, L-40.72 sec    Time 6    Period Weeks    Status Achieved   RUE 39.75 secsLUE 33.94 secs     OT SHORT TERM GOAL #6   Title Pt will be able to do simple maintenance/meal prep tasks with min assist    Baseline children performing    Time 6    Period Weeks    Status Achieved   min A            OT Long Term Goals - 10/23/20 1333      OT LONG TERM GOAL #1   Title Pt will verbalize understanding of adaptive strategies to incr ease, safety, and independence/participation with ADLs/IADLs.-    Baseline dependent    Time 12    Period Weeks    Status New      OT LONG TERM GOAL #2   Title Pt will  improve RUE coordination/functional reaching for ADLs as shown by improving score on box and blocks test by at least 10 blocks bilaterally.    Baseline R-23 blocks    Time 12    Period Weeks    Status New      OT LONG TERM GOAL #3   Title Pt to write 2 sentences maintaining 80% or greater legibility and letter size    Baseline name only 90%    Time 12    Period Weeks    Status New      OT LONG TERM GOAL #4   Title Pt will demo improved ease/decr bradykinesia for with dressing as shown by improving time on PPT#4 (donning/doffing jacket) by at least 10 sec.    Baseline 52.44 sec    Time 12    Period Weeks    Status New      OT LONG TERM GOAL #5   Title Pt will be able to use dominant RUE for bathing and eating at least 25% of the time.    Baseline only using LUE currently    Time 12    Period Weeks    Status New      OT LONG TERM GOAL #6   Title Pt will verbalize understanding of ways to decr risk of future complications related to PD and appropriate community resources.    Baseline dependent    Time 12    Period Weeks    Status New                 Plan - 11/13/20 1557    Clinical Impression Statement Pt is progressing towards goals. She met several short term goals    OT Occupational Profile and History Problem Focused Assessment - Including review of records relating to presenting problem    Occupational performance deficits (Please refer to evaluation for details): ADL's;IADL's    Body Structure / Function / Physical Skills ADL;Balance;IADL;ROM;Dexterity;Improper spinal/pelvic alignment;Mobility;Flexibility;Tone;FMC;Coordination;Decreased knowledge of precautions;Decreased knowledge of use of DME;GMC;UE functional use    Rehab Potential  Good    Clinical Decision Making Several treatment options, min-mod task modification necessary    Comorbidities Affecting Occupational Performance: May have comorbidities impacting occupational performance    OT Frequency 1x / week     OT Duration 12 weeks    OT Treatment/Interventions Self-care/ADL training;Moist Heat;Fluidtherapy;DME and/or AE instruction;Splinting;Therapeutic activities;Aquatic Therapy;Therapeutic exercise;Passive range of motion;Functional Mobility Training;Neuromuscular education;Paraffin;Manual Therapy;Energy conservation;Patient/family education    Plan big movements with ADLS    Consulted and Agree with Plan of Care Patient           Patient will benefit from skilled therapeutic intervention in order to improve the following deficits and impairments:   Body Structure / Function / Physical Skills: ADL,Balance,IADL,ROM,Dexterity,Improper spinal/pelvic alignment,Mobility,Flexibility,Tone,FMC,Coordination,Decreased knowledge of precautions,Decreased knowledge of use of DME,GMC,UE functional use       Visit Diagnosis: Other symptoms and signs involving the nervous system  Abnormal posture  Other symptoms and signs involving the musculoskeletal system  Unsteadiness on feet  Muscle weakness (generalized)  Other lack of coordination  Stiffness of right hand, not elsewhere classified  Stiffness of right elbow, not elsewhere classified  Stiffness of right shoulder, not elsewhere classified  Tremor    Problem List Patient Active Problem List   Diagnosis Date Noted  . Encounter for hepatitis C screening test for low risk patient 08/28/2020  . Encounter for screening for HIV 08/28/2020  . Hyperlipidemia 01/10/2020  . Parkinson's disease ([place])   . Hypertension   . Screening for colorectal cancer   . Breast cancer screening     Webb Weed 11/13/2020, 3:57 PM Theone Murdoch, OTR/L [phone] Phone: [phone] 3:58 PM 11/13/20 [place] [address] [place], [place], [address] Phone: [phone]   Fax:  [phone]  Name: Teresa Logan MRN: [id number] Date of Birth: 06/10/1966

## 2020-11-13 NOTE — Therapy (Signed)
[facility] [address] [place], [place], [address] Phone: [phone]   Fax:  [phone]  Physical Therapy Treatment  Patient Details  Name: Teresa Logan MRN: [id number] Date of Birth: 1966/05/27 Referring Provider (PT): Alonza Bogus, DO   Encounter Date: 11/13/2020   PT End of Session - 11/13/20 1359    Visit Number 6    Number of Visits 9    Authorization Type [employer] Medicaid ([facility] [place]) - auth for 8 PT visits 10/09/20-12/04/20    PT Start Time 1237   pt late to session   PT Stop Time 1315    PT Time Calculation (min) 38 min    Equipment Utilized During Treatment Gait belt    Activity Tolerance Patient tolerated treatment well    Behavior During Therapy [facility] for tasks assessed/performed           Past Medical History:  Diagnosis Date  . HTN (hypertension)   . Kidney stones   . Parkinson's disease ([place])   . Tremors of nervous system     Past Surgical History:  Procedure Laterality Date  . TONSILLECTOMY      There were no vitals filed for this visit.   Subjective Assessment - 11/13/20 1239    Subjective Pt has been doing well the past couple of days. Has been doing the walking, not so much the other exercises.    Pertinent History PD (formally diagnosed in 2019), HTN    Patient Stated Goals Pt's goals for therapy are to improve balance, strength, and endurance    Currently in Pain? No/denies              [facility] PT Assessment - 11/13/20 1241      Mini-BESTest   Sit To Stand Normal: Comes to stand without use of hands and stabilizes independently.    Rise to Toes Moderate: Heels up, but not full range (smaller than when holding hands), OR noticeable instability for 3 s.    Stand on one leg (left) Moderate: < 20 s   5.16, 1.72   Stand on one leg (right) Moderate: < 20 s   1.96, 4.88   Stand on one leg - lowest score 1    Compensatory Stepping Correction - Forward Normal: Recovers  independently with a single, large step (second realignement is allowed).    Compensatory Stepping Correction - Backward Moderate: More than one step is required to recover equilibrium    Compensatory Stepping Correction - Left Lateral Moderate: Several steps to recover equilibrium    Compensatory Stepping Correction - Right Lateral Moderate: Several steps to recover equilibrium    Stepping Corredtion Lateral - lowest score 1    Stance - Feet together, eyes open, firm surface  Normal: 30s    Stance - Feet together, eyes closed, foam surface  Moderate: < 30s    Incline - Eyes Closed Normal: Stands independently 30s and aligns with gravity    Change in Gait Speed Moderate: Unable to change walking speed or signs of imbalance    Walk with head turns - Horizontal Normal: performs head turns with no change in gait speed and good balance    Walk with pivot turns Normal: Turns with feet close FAST (< 3 steps) with good balance.    Step over obstacles Moderate: Steps over box but touches box OR displays cautious behavior by slowing gait.    Timed UP & GO with Dual Task Moderate: Dual Task affects either counting  OR walking (>10%) when compared to the TUG without Dual Task.    Mini-BEST total score 20      Timed Up and Go Test   Normal TUG (seconds) 8.41    Cognitive TUG (seconds) 9.84                         [facility] Adult PT Treatment/Exercise - 11/13/20 1241      Transfers   Sit to Stand 5: Supervision;Without upper extremity assist;From chair/3-in-1    Five time sit to stand comments  17.15    Stand to Sit 5: Supervision;Without upper extremity assist;To chair/3-in-1      Ambulation/Gait   Ambulation/Gait Yes    Ambulation/Gait Assistance 5: Supervision    Ambulation/Gait Assistance Details 3 laps with walking pole and therapist facilitating arm swing, 2 laps without poles with continued cues for R arm swing and releaxing R hand. discussed when walking at home, thinking about  walking with bigger movements and intensity and RUE arm swing    Ambulation Distance (Feet) 575 Feet    Assistive device None    Gait Pattern Step-through pattern;Decreased arm swing - right;Decreased arm swing - left;Decreased stride length;Decreased hip/knee flexion - right;Right foot flat;Left foot flat;Poor foot clearance - right;Trunk flexed    Ambulation Surface Level;Indoor               Balance Exercises - 11/13/20 0001      Balance Exercises: Standing   Standing Eyes Closed Narrow base of support (BOS);Foam/compliant surface;3 reps   on blue air ex for 15-20 seconds   Stepping Strategy Posterior;Limitations;10 reps    Stepping Strategy Limitations on air ex, no UE support, cues for weight shift             PT Education - 11/13/20 1302    Education Details provided handout on fall prevention in the home, progress towards goals    Person(s) Educated Patient    Methods Explanation;Handout    Comprehension Verbalized understanding            PT Short Term Goals - 11/13/20 1242      PT SHORT TERM GOAL #1   Title Pt will be indepent with initial HEP for improved overall functional strength, balance, and gait.  TARGET 11/08/2020    Baseline pt reports primarily only performing walking, not other exercises.    Time 4    Period Weeks    Status Partially Met      PT SHORT TERM GOAL #2   Title Pt will improve 5x sit<>Stand test to less than or equal to 15 seconds for improved functional strength.    Baseline 18.69 sec at eval, 17.15 seconds with no UE support on 11/13/20    Time 4    Period Weeks    Status Not Met      PT SHORT TERM GOAL #3   Title Pt will improve MiniBESTest score to at least 19/28 for decreased fall risk.    Baseline 16/28 at eval (<22/28 indicates increased fall risk), 20/28 on 11/13/20    Time 4    Period Weeks    Status Achieved      PT SHORT TERM GOAL #4   Title Pt will verbalize understanding of fall prevention in home environment.     Baseline At fall risk per TUG and MiniBESTest; has had one fall - provided hand out today with pt verbalizing understanding    Time 4  Period Weeks    Status Achieved      PT SHORT TERM GOAL #5   Title Pt will improve TUG and TUG cognitive score to less than 10% difference for improved dual tasking, decreased fall risk with gait.    Baseline TUG 8.41, cog TUG 9.84 - not to goal but improved and decr fall risk    Time 4    Period Weeks    Status Partially Met             PT Long Term Goals - 10/07/20 1601      PT LONG TERM GOAL #1   Title Pt will be independent with final HEP to build upon functional gains made in therapy. LTG TARGET 12/06/2020 (may be extended due to [facility] auth)    Baseline no formal HEP    Time 8    Period Weeks    Status New      PT LONG TERM GOAL #2   Title Pt will improve miniBEST to at least a 22/28 in order to decr fall risk.    Baseline 16/28 at eval    Time 8    Period Weeks    Status New      PT LONG TERM GOAL #3   Title Pt will improve gait speed to at least 2.62 ft/sec in order to demo improved gait efficiency and decr fall risk.    Baseline 2.35 ft/sec at eval    Time 8    Period Weeks    Status New      PT LONG TERM GOAL #4   Title Pt will improve 5x sit<>stand to less than or equal to 12 sec for improved functional strength and transfer efficiency.    Baseline 18.69 sec at eval    Time 8    Period Weeks    Status New      PT LONG TERM GOAL #5   Title Pt will verbalize plans for continued community fitness upon d/c from PT.    Baseline no formal community fitness    Time 8    Period Weeks    Status New                 Plan - 11/13/20 1405    Clinical Impression Statement Focus of today's skilled session was assessing pt's STGs. Pt achieved 2 out of 5 STGs. Pt improved miniBEST to a 20/28 (previously was 16/28). Pt with improvement of TUG to 8.41 seconds and cog TUG to 9.84 seconds - improvement and decr fall risk, but  not to goal. Pt did not meet STG #2 in regards to 5x sit <> stand - performed in 17.15 seconds with no UE support (previously 18.69 seconds). Remainder of session focused on gait training with incr intensity of movement and B arm swing and balance on compliant surfaces. Will continue to progress towards LTGs.    Personal Factors and Comorbidities Comorbidity 2    Comorbidities PD, HTN    Examination-Activity Limitations Locomotion Level;Stand;Stairs;Squat;Hygiene/Grooming;Dressing;Transfers;Caring for Others    Stability/Clinical Decision Making Evolving/Moderate complexity    Rehab Potential Good    PT Frequency 1x / week   per patient request, as PT recommends 2x/wk   PT Duration 8 weeks    PT Treatment/Interventions ADLs/Self Care Home Management;DME Instruction;Neuromuscular re-education;Balance training;Therapeutic exercise;Therapeutic activities;Functional mobility training;Stair training;Gait training;Patient/family education;Energy conservation;Manual techniques    PT Next Visit Plan continue gait training with walking poles/arm swing. SciFit/NuStep. work on improved balance (SLS, stepping). obstacle negotiation.  balance on unlevel surfaces.    Consulted and Agree with Plan of Care Patient           Patient will benefit from skilled therapeutic intervention in order to improve the following deficits and impairments:  Abnormal gait,Decreased activity tolerance,Decreased balance,Decreased coordination,Decreased endurance,Decreased strength,Difficulty walking,Impaired tone,Postural dysfunction  Visit Diagnosis: Unsteadiness on feet  Abnormal posture  Other symptoms and signs involving the nervous system  Muscle weakness (generalized)     Problem List Patient Active Problem List   Diagnosis Date Noted  . Encounter for hepatitis C screening test for low risk patient 08/28/2020  . Encounter for screening for HIV 08/28/2020  . Hyperlipidemia 01/10/2020  . Parkinson's disease  ([place])   . Hypertension   . Screening for colorectal cancer   . Breast cancer screening     Arliss Journey, PT, DPT  11/13/2020, 2:08 PM  [facility] [address] [place], [place], [address] Phone: [phone]   Fax:  [phone]  Name: Teresa Logan MRN: [id number] Date of Birth: 1965-12-03

## 2020-11-13 NOTE — Patient Instructions (Signed)
Fall Prevention in the Home, Adult Falls can cause injuries and can happen to people of all ages. There are many things you can do to make your home safe and to help prevent falls. Ask for help when making these changes. What actions can I take to prevent falls? General Instructions  Use good lighting in all rooms. Replace any light bulbs that burn out.  Turn on the lights in dark areas. Use night-lights.  Keep items that you use often in easy-to-reach places. Lower the shelves around your home if needed.  Set up your furniture so you have a clear path. Avoid moving your furniture around.  Do not have throw rugs or other things on the floor that can make you trip.  Avoid walking on wet floors.  If any of your floors are uneven, fix them.  Add color or contrast paint or tape to clearly mark and help you see: ? Grab bars or handrails. ? First and last steps of staircases. ? Where the edge of each step is.  If you use a stepladder: ? Make sure that it is fully opened. Do not climb a closed stepladder. ? Make sure the sides of the stepladder are locked in place. ? Ask someone to hold the stepladder while you use it.  Know where your pets are when moving through your home. What can I do in the bathroom?  Keep the floor dry. Clean up any water on the floor right away.  Remove soap buildup in the tub or shower.  Use nonskid mats or decals on the floor of the tub or shower.  Attach bath mats securely with double-sided, nonslip rug tape.  If you need to sit down in the shower, use a plastic, nonslip stool.  Install grab bars by the toilet and in the tub and shower. Do not use towel bars as grab bars.      What can I do in the bedroom?  Make sure that you have a light by your bed that is easy to reach.  Do not use any sheets or blankets for your bed that hang to the floor.  Have a firm chair with side arms that you can use for support when you get dressed. What can I do in  the kitchen?  Clean up any spills right away.  If you need to reach something above you, use a step stool with a grab bar.  Keep electrical cords out of the way.  Do not use floor polish or wax that makes floors slippery. What can I do with my stairs?  Do not leave any items on the stairs.  Make sure that you have a light switch at the top and the bottom of the stairs.  Make sure that there are handrails on both sides of the stairs. Fix handrails that are broken or loose.  Install nonslip stair treads on all your stairs.  Avoid having throw rugs at the top or bottom of the stairs.  Choose a carpet that does not hide the edge of the steps on the stairs.  Check carpeting to make sure that it is firmly attached to the stairs. Fix carpet that is loose or worn. What can I do on the outside of my home?  Use bright outdoor lighting.  Fix the edges of walkways and driveways and fix any cracks.  Remove anything that might make you trip as you walk through a door, such as a raised step or threshold.  Trim any   bushes or trees on paths to your home.  Check to see if handrails are loose or broken and that both sides of all steps have handrails.  Install guardrails along the edges of any raised decks and porches.  Clear paths of anything that can make you trip, such as tools or rocks.  Have leaves, snow, or ice cleared regularly.  Use sand or salt on paths during winter.  Clean up any spills in your garage right away. This includes grease or oil spills. What other actions can I take?  Wear shoes that: ? Have a low heel. Do not wear high heels. ? Have rubber bottoms. ? Feel good on your feet and fit well. ? Are closed at the toe. Do not wear open-toe sandals.  Use tools that help you move around if needed. These include: ? Canes. ? Walkers. ? Scooters. ? Crutches.  Review your medicines with your doctor. Some medicines can make you feel dizzy. This can increase your chance  of falling. Ask your doctor what else you can do to help prevent falls. Where to find more information  Centers for Disease Control and Prevention, [employer]AD[employer]: www.cdc.gov  National Institute on Aging: www.nia.nih.gov Contact a doctor if:  You are afraid of falling at home.  You feel weak, drowsy, or dizzy at home.  You fall at home. Summary  There are many simple things that you can do to make your home safe and to help prevent falls.  Ways to make your home safe include removing things that can make you trip and installing grab bars in the bathroom.  Ask for help when making these changes in your home. This information is not intended to replace advice given to you by your health care provider. Make sure you discuss any questions you have with your health care provider. Document Revised: 04/17/2020 Document Reviewed: 04/17/2020 Elsevier Patient Education  2021 Elsevier Inc.  

## 2020-11-20 ENCOUNTER — Ambulatory Visit: Payer: 59 | Admitting: Physical Therapy

## 2020-11-20 ENCOUNTER — Ambulatory Visit: Payer: 59 | Admitting: Occupational Therapy

## 2020-11-26 ENCOUNTER — Other Ambulatory Visit: Payer: Self-pay | Admitting: Internal Medicine

## 2020-11-26 DIAGNOSIS — E785 Hyperlipidemia, unspecified: Secondary | ICD-10-CM

## 2020-11-27 ENCOUNTER — Ambulatory Visit: Payer: 59 | Admitting: Physical Therapy

## 2020-11-27 ENCOUNTER — Ambulatory Visit: Payer: 59 | Admitting: Occupational Therapy

## 2020-12-04 ENCOUNTER — Ambulatory Visit: Payer: 59 | Admitting: Occupational Therapy

## 2020-12-04 ENCOUNTER — Ambulatory Visit: Payer: 59 | Admitting: Physical Therapy

## 2020-12-31 ENCOUNTER — Other Ambulatory Visit: Payer: Self-pay | Admitting: Neurology

## 2020-12-31 DIAGNOSIS — G20 Parkinson's disease: Secondary | ICD-10-CM

## 2020-12-31 NOTE — Telephone Encounter (Signed)
Rx(s) sent to pharmacy electronically.  

## 2021-01-29 ENCOUNTER — Encounter: Payer: 59 | Admitting: Internal Medicine

## 2021-02-14 ENCOUNTER — Ambulatory Visit: Payer: Medicaid Other | Admitting: Neurology

## 2021-02-18 NOTE — Progress Notes (Signed)
Assessment/Plan:   1.  Parkinsons Disease, diagnosed 55 years old but largely untreated for the first 9 years of dx, started levodopa April, 2021  -increase carbidopa/levodopa 25/100, 2/2/1.  She takes it 4pm/8pm/midnight as she sleeps all day and is then awake much of the night (doesn't work but used to work nights and kept this schedule).  Would like to see her change this as her body likely won't tolerate as ages.  This is also likely the reason she looks her rigid when she comes in.  -Continue pramipexole 0.5 mg 3 times daily  - will need to watch weight.  She has gained quite a bit of weight.  She does not think that she is eating more.  Told her we will need to watch this and she will need to watch the amount of food intake.  We discussed proper foods and decreasing the amount of carbohydrates.  -Had patient meet with my LCSW today regarding Parkinson's programs in the area.  2.  Hypertension  -Being managed in the resident clinic.  On 3 agents, losartan, hydrochlorothiazide and amlodipine.  Pt reports pressure is good and it was good in the office today.  Subjective:   Teresa Logan was seen today in follow up for young onset Parkinsons disease.  My previous records were reviewed prior to todays visit as well as outside records available to me.  She is on levodopa.  We started pramipexole last visit.  No evidence of compulsive behaviors or sleep attacks.  Reports that she thinks that the pramipexole has been helping.pt denies falls but she does feel unstable.  [facility] and [facility] the bike 10 min at time.  [facility] goes to [facility] [employer] about 2 times per week.    Pt denies lightheadedness, near syncope.  No hallucinations.  Mood has been good.  She did attend physical and Occupational Therapy after our last visit.  Those notes were reviewed.  Reports she got bored with it.    Current prescribed movement disorder medications: Carbidopa/levodopa 25/100, 1 tablet 3 times per  day  Pramipexole, 0.5 mg 3 times per day (started last visit)  ALLERGIES:  No Known Allergies  CURRENT MEDICATIONS:  Outpatient Encounter Medications as of 02/20/2021  Medication Sig  . aspirin EC 81 MG tablet Take 162 mg by mouth daily.  . carbidopa-levodopa (SINEMET IR) 25-100 MG tablet TAKE 1 TABLET BY MOUTH THREE TIMES DAILY  . ibuprofen (ADVIL,MOTRIN) 600 MG tablet Take 1 tablet (600 mg total) by mouth every 6 (six) hours as needed.  . Olmesartan-amLODIPine-HCTZ 40-5-12.5 MG TABS Take 1 tablet by mouth daily.  . pramipexole (MIRAPEX) 0.5 MG tablet Take 1 tablet (0.5 mg total) by mouth 3 (three) times daily.  . rosuvastatin (CRESTOR) 10 MG tablet TAKE 1 TABLET(10 MG) BY MOUTH DAILY  . [DISCONTINUED] pramipexole (MIRAPEX) 0.125 MG tablet 1 tablet three times per day for a week, then 2 po three times per day for a week   No facility-administered encounter medications on file as of 02/20/2021.    Objective:   PHYSICAL EXAMINATION:    VITALS:   Vitals:   02/20/21 1531  BP: 130/78  Pulse: 89  SpO2: 97%  Weight: 233 lb (105.7 kg)  Height: 5\' 6"  (1.676 m)   Wt Readings from Last 3 Encounters:  02/20/21 233 lb (105.7 kg)  08/27/20 219 lb (99.3 kg)  08/19/20 218 lb (98.9 kg)     GEN:  The patient appears  stated age and is in NAD. HEENT:  Normocephalic, atraumatic.  The mucous membranes are moist. The superficial temporal arteries are without ropiness or tenderness. CV:  RRR Lungs:  CTAB Neck/HEME:  There are no carotid bruits bilaterally.  Neurological examination:  Orientation: The patient is alert and oriented x3. Cranial nerves: There is good facial symmetry with facial hypomimia. The speech is fluent and clear. Soft palate rises symmetrically and there is no tongue deviation. Hearing is intact to conversational tone. Sensation: Sensation is intact to light touch throughout Motor: Strength is at least antigravity x4.  Movement examination: Tone: There is mod to  increased tone in the RUE/RLE and mild in the LUE Abnormal movements: there is RUE/RLE rest tremor and mild LUE rest tremor Coordination:  There is  decremation with RAM's, R>L Gait and Station: The patient has no difficulty arising out of a deep-seated chair without the use of the hands. The patient's stride length is good today  I have reviewed and interpreted the following labs independently    Chemistry      Component Value Date/Time   NA 144 08/27/2020 1624   K 4.0 08/27/2020 1624   CL 102 08/27/2020 1624   CO2 23 08/27/2020 1624   BUN 16 08/27/2020 1624   CREATININE 0.81 08/27/2020 1624      Component Value Date/Time   CALCIUM 9.9 08/27/2020 1624   ALKPHOS 107 01/10/2020 1450   AST 19 01/10/2020 1450   ALT 16 01/10/2020 1450   BILITOT 0.3 01/10/2020 1450       Lab Results  Component Value Date   WBC 9.7 12/08/2019   HGB 16.8 (H) 12/08/2019   HCT 50.4 (H) 12/08/2019   MCV 86 12/08/2019   PLT 328 12/08/2019    No results found for: TSH   Total time spent on today's visit was 30 minutes, including both face-to-face time and nonface-to-face time.  Time included that spent on review of records (prior notes available to me/labs/imaging if pertinent), discussing treatment and goals, answering patient's questions and coordinating care.  Cc:  Albertha Ghee, MD

## 2021-02-20 ENCOUNTER — Other Ambulatory Visit: Payer: Self-pay

## 2021-02-20 ENCOUNTER — Ambulatory Visit (INDEPENDENT_AMBULATORY_CARE_PROVIDER_SITE_OTHER): Payer: 59 | Admitting: Neurology

## 2021-02-20 ENCOUNTER — Encounter: Payer: Self-pay | Admitting: Neurology

## 2021-02-20 DIAGNOSIS — G20 Parkinson's disease: Secondary | ICD-10-CM

## 2021-02-20 MED ORDER — PRAMIPEXOLE DIHYDROCHLORIDE 0.5 MG PO TABS: 0.5000 mg | ORAL_TABLET | Freq: Three times a day (TID) | ORAL | 1 refills | Status: DC

## 2021-02-20 MED ORDER — CARBIDOPA-LEVODOPA 25-100 MG PO TABS: ORAL_TABLET | ORAL | 1 refills | Status: DC

## 2021-02-20 NOTE — Patient Instructions (Addendum)
1.  Take carbidopa/levodopa 25/100, 2 tablets at 8am/2 tablets at noon/1 tablet at 4pm 2.  Continue pramipexole 0.5 mg three times per day at same times of day as levodopa   T-Shirt design contest   SUBMIT UNIQUE DESIGN TO WIN!  Winning design will be featured on T-shirts to support our local Parkinson's patients.   Contest rules and information in on link below .   Contest ends on August 31,2022    How to [employer] your own unique quote or a design that will stand out and support the ideas behind Parkinson's Awareness  This contest is open to everyone. The winning design or quote will be selected by a group of unbiased judges. Judges will vote based on quality, relevance, and aesthetic. Judges will not have access to the names associated with the submission. Submissions accepted until: May 28, 2021 What You Win Your own quote or design will be printed on a t-shirt that will support Parkinson's Awareness!! Feel good knowing your quote/artwork will help spread awareness for Parkinson's contribute and 100% of profits to the local area . Tips for Submissions We will accept submissions in the form of quotes, Counselling psychologist, or hand drawn artwork.  Mission/Theme: The Power of One ! Working together to support and treat to improve quality of life for Parkinson's Patients Rules & Regulations All designs and quotes must be original and not subject to copyright of another person or entity. Distribution and Reproduction Rights for all quotes and artwork will be transferred to [facility] [employer] upon submission to the contest via the [employer] form on May 28, 2021

## 2021-02-23 NOTE — Assessment & Plan Note (Deleted)
HYPERTENSION FOLLOW-UP: Current medications: olmesartan-amlodipine-hctz 40-5-12.5mg  daily Med Adherence: []  Yes    []  No Medication side effects: []  Yes    []  No Adherence with salt restriction: []  Yes    []  No  Assessment: Blood pressure is ** goal in the office today. BP Readings from Last 3 Encounters:  02/20/21 130/78  08/27/20 130/83  08/19/20 139/85     Plan -BMP -continue current management -follow up 3 mo

## 2021-02-23 NOTE — Progress Notes (Deleted)
   Office Visit   Patient ID: Teresa Logan, female    DOB: 09/15/1966, 55 y.o.   MRN: [id number]   PCP: Albertha Ghee, MD   Subjective:  CC: chronic hypertension, hyperlipemia  Teresa Logan is a 55 y.o. year old female who presents for a routine evaluation of her chronic medical issues. Please refer to problem based charting for assessment and plan.      ACTIVE MEDICATIONS   Outpatient Medications Prior to Visit  Medication Sig Dispense Refill  . aspirin EC 81 MG tablet Take 162 mg by mouth daily.    . carbidopa-levodopa (SINEMET IR) 25-100 MG tablet 2 at 8am/2 at noon/1 at 4pm 450 tablet 1  . ibuprofen (ADVIL,MOTRIN) 600 MG tablet Take 1 tablet (600 mg total) by mouth every 6 (six) hours as needed. 30 tablet 0  . Olmesartan-amLODIPine-HCTZ 40-5-12.5 MG TABS Take 1 tablet by mouth daily. 90 tablet 2  . pramipexole (MIRAPEX) 0.5 MG tablet Take 1 tablet (0.5 mg total) by mouth 3 (three) times daily. 270 tablet 1  . rosuvastatin (CRESTOR) 10 MG tablet TAKE 1 TABLET(10 MG) BY MOUTH DAILY 90 tablet 0   No facility-administered medications prior to visit.     Objective:   There were no vitals taken for this visit. Wt Readings from Last 3 Encounters:  02/20/21 233 lb (105.7 kg)  08/27/20 219 lb (99.3 kg)  08/19/20 218 lb (98.9 kg)   BP Readings from Last 3 Encounters:  02/20/21 130/78  08/27/20 130/83  08/19/20 139/85   Physical Exam  Health Maintenance:   Health Maintenance  Topic Date Due  . COVID-19 Vaccine (1) Never done  . TETANUS/TDAP  Never done  . PAP SMEAR-Modifier  Never done  . Zoster Vaccines- Shingrix (1 of 2) Never done  . COLON CANCER SCREENING ANNUAL FOBT  03/25/2021  . INFLUENZA VACCINE  04/28/2021  . MAMMOGRAM  01/02/2022  . Hepatitis C Screening  Completed  . HIV Screening  Completed  . HPV VACCINES  Aged Out  . COLONOSCOPY (Pts 45-8yrs Insurance coverage will need to be confirmed)  Discontinued     Assessment & Plan:    Problem List Items Addressed This Visit   None      No follow-ups on file.   Pt discussed with ***  Elige Radon, MD Internal Medicine Resident PGY-2 [facility] Pager: [phone] 02/23/2021 10:26 PM

## 2021-02-23 NOTE — Assessment & Plan Note (Deleted)
Check lipid panel today.  Continue statin 

## 2021-02-27 ENCOUNTER — Encounter: Payer: 59 | Admitting: Internal Medicine

## 2021-02-27 DIAGNOSIS — E785 Hyperlipidemia, unspecified: Secondary | ICD-10-CM

## 2021-02-27 DIAGNOSIS — I10 Essential (primary) hypertension: Secondary | ICD-10-CM

## 2021-03-03 ENCOUNTER — Other Ambulatory Visit: Payer: Self-pay | Admitting: *Deleted

## 2021-03-03 DIAGNOSIS — E785 Hyperlipidemia, unspecified: Secondary | ICD-10-CM

## 2021-03-03 MED ORDER — ROSUVASTATIN CALCIUM 10 MG PO TABS: ORAL_TABLET | ORAL | 0 refills | Status: DC

## 2021-04-29 ENCOUNTER — Encounter: Payer: 59 | Admitting: Internal Medicine

## 2021-05-06 ENCOUNTER — Encounter: Payer: Self-pay | Admitting: Internal Medicine

## 2021-05-06 ENCOUNTER — Ambulatory Visit (INDEPENDENT_AMBULATORY_CARE_PROVIDER_SITE_OTHER): Payer: 59 | Admitting: Internal Medicine

## 2021-05-06 ENCOUNTER — Encounter: Payer: Self-pay | Admitting: Neurology

## 2021-05-06 VITALS — BP 107/86 | HR 93 | Temp 97.8°F | Ht 66.0 in | Wt 236.6 lb

## 2021-05-06 DIAGNOSIS — E782 Mixed hyperlipidemia: Secondary | ICD-10-CM

## 2021-05-06 DIAGNOSIS — I10 Essential (primary) hypertension: Secondary | ICD-10-CM | POA: Diagnosis not present

## 2021-05-06 DIAGNOSIS — R635 Abnormal weight gain: Secondary | ICD-10-CM | POA: Diagnosis not present

## 2021-05-06 DIAGNOSIS — B356 Tinea cruris: Secondary | ICD-10-CM

## 2021-05-06 DIAGNOSIS — E669 Obesity, unspecified: Secondary | ICD-10-CM | POA: Diagnosis not present

## 2021-05-06 LAB — POCT GLYCOSYLATED HEMOGLOBIN (HGB A1C): Hemoglobin A1C: 6.2 % — AB (ref 4.0–5.6)

## 2021-05-06 LAB — GLUCOSE, CAPILLARY: Glucose-Capillary: 127 mg/dL — ABNORMAL HIGH (ref 70–99)

## 2021-05-06 MED ORDER — TOLNAFTATE 1 % EX CREA: 1.0000 "application " | TOPICAL_CREAM | Freq: Two times a day (BID) | CUTANEOUS | 0 refills | Status: DC

## 2021-05-06 NOTE — Progress Notes (Signed)
   CC: hypertension , hyperlipidemia, and obesity   HPI:Ms.Bernadean Saling is a 55 y.o. female who presents for evaluation of HTN,HLD, and obesity. Please see individual problem based A/P for details.   Past Medical History:  Diagnosis Date   HTN (hypertension)    Kidney stones    Parkinson's disease (HCC)    Tremors of nervous system    Review of Systems:   Review of Systems  Constitutional:  Negative for chills and fever.  Gastrointestinal:  Negative for constipation and diarrhea.  Neurological:  Positive for tremors (resting). Negative for dizziness.    Physical Exam: Vitals:   05/06/21 1451  BP: 107/86  Pulse: 93  Temp: 97.8 F (36.6 C)  TempSrc: Oral  SpO2: 100%  Weight: 236 lb 9.6 oz (107.3 kg)  Height: 5\' 6"  (1.676 m)   General: Well kept, tremor at rest in right arm and leg HEENT: Conjunctiva nl , antiicteric sclerae Cardiovascular: Normal rate, regular rhythm.  No murmurs Pulmonary : Equal breath sounds, No wheezes, rales, or rhonchi Abdominal: soft, nontender,  bowel sounds present Ext: No edema in lower extremities, no tenderness to palpation of lower extremities.   Assessment & Plan:   See Encounters Tab for problem based charting.  Patient discussed with Dr. 

## 2021-05-06 NOTE — Patient Instructions (Addendum)
Thank you for trusting me with your care. To recap, today we discussed the following:  1. Essential hypertension - CMP14 + Anion Gap  2. Mixed hyperlipidemia - Lipid Profile  3. Weight gain - POC Hbg A1C

## 2021-05-07 LAB — CMP14 + ANION GAP
ALT: 6 IU/L (ref 0–32)
AST: 17 IU/L (ref 0–40)
Albumin/Globulin Ratio: 1.6 (ref 1.2–2.2)
Albumin: 4.8 g/dL (ref 3.8–4.9)
Alkaline Phosphatase: 110 IU/L (ref 44–121)
Anion Gap: 18 mmol/L (ref 10.0–18.0)
BUN/Creatinine Ratio: 17 (ref 9–23)
BUN: 14 mg/dL (ref 6–24)
Bilirubin Total: 0.4 mg/dL (ref 0.0–1.2)
CO2: 24 mmol/L (ref 20–29)
Calcium: 10 mg/dL (ref 8.7–10.2)
Chloride: 98 mmol/L (ref 96–106)
Creatinine, Ser: 0.84 mg/dL (ref 0.57–1.00)
Globulin, Total: 3 g/dL (ref 1.5–4.5)
Glucose: 113 mg/dL — ABNORMAL HIGH (ref 65–99)
Potassium: 4 mmol/L (ref 3.5–5.2)
Sodium: 140 mmol/L (ref 134–144)
Total Protein: 7.8 g/dL (ref 6.0–8.5)
eGFR: 82 mL/min/{1.73_m2} (ref >59)

## 2021-05-07 LAB — LIPID PANEL
Chol/HDL Ratio: 4 ratio (ref 0.0–4.4)
Cholesterol, Total: 182 mg/dL (ref 100–199)
HDL: 46 mg/dL (ref >39)
LDL Chol Calc (NIH): 85 mg/dL (ref 0–99)
Triglycerides: 310 mg/dL — ABNORMAL HIGH (ref 0–149)
VLDL Cholesterol Cal: 51 mg/dL — ABNORMAL HIGH (ref 5–40)

## 2021-05-09 ENCOUNTER — Encounter: Payer: Self-pay | Admitting: Internal Medicine

## 2021-05-09 DIAGNOSIS — B356 Tinea cruris: Secondary | ICD-10-CM | POA: Insufficient documentation

## 2021-05-09 DIAGNOSIS — E669 Obesity, unspecified: Secondary | ICD-10-CM | POA: Insufficient documentation

## 2021-05-09 NOTE — Assessment & Plan Note (Addendum)
Patient with good response to moderate intensity statin.  50% reduction in LDL. Continue Crestor 10 mg.

## 2021-05-09 NOTE — Assessment & Plan Note (Signed)
HPI: Patient's BP today is 107/86. The patient endorses adherence to her medication regimen. She denied, chest pain, headache, visual changes, lightheadedness, weakness, dizziness on standing, swelling in the feet or ankles.   Assessment/Plan: Continue olmesartan- amlodipine-hctz 40-5-12.5 - CMP14 + Anion Gap

## 2021-05-09 NOTE — Assessment & Plan Note (Signed)
Patient has pruritic raised and circular rash in intergluteal cleft. Appears to be Tinea Cruris.  Tinea cruris - tolnaftate (TINACTIN) 1 % cream; Apply 1 application topically 2 (two) times daily.  Dispense: 30 g; Refill: 0

## 2021-05-09 NOTE — Assessment & Plan Note (Addendum)
Wt Readings from Last 3 Encounters:  05/06/21 236 lb 9.6 oz (107.3 kg)  02/20/21 233 lb (105.7 kg)  08/27/20 219 lb (99.3 kg)   Patient concerned with weight gain since starting leva-dopa. Weight gain has leveled off and she is finding benefit from this treatment of her parkinson disease.  Checking Hbg A1c today.

## 2021-05-13 NOTE — Progress Notes (Signed)
Internal Medicine Clinic Attending  Case discussed with Dr. Steen  At the time of the visit.  We reviewed the resident's history and exam and pertinent patient test results.  I agree with the assessment, diagnosis, and plan of care documented in the resident's note.  

## 2021-05-22 ENCOUNTER — Telehealth: Payer: Self-pay

## 2021-05-22 MED ORDER — FLUCONAZOLE 150 MG PO TABS: 150.0000 mg | ORAL_TABLET | ORAL | 0 refills | Status: DC

## 2021-05-22 NOTE — Telephone Encounter (Signed)
Prescription sent for treatment of intertrigo. Patient will also use barrier cream and keep the area clean/dry.

## 2021-05-22 NOTE — Telephone Encounter (Signed)
Returned call to patient. States the tinactin cream is not helping with rash in gluteal fold. She is wanting to know if an oral tab could be ordered.

## 2021-05-22 NOTE — Telephone Encounter (Signed)
Requesting to speak with a nurse about getting medication for rash on the arm. Please call pt back.

## 2021-06-03 IMAGING — MG DIGITAL SCREENING BILAT W/ CAD
5 series · 5 of 5 positions shown · non-contrast
Comparison: None.

CLINICAL DATA: Screening.

EXAM:
DIGITAL SCREENING BILATERAL MAMMOGRAM WITH CAD

[L MLO]
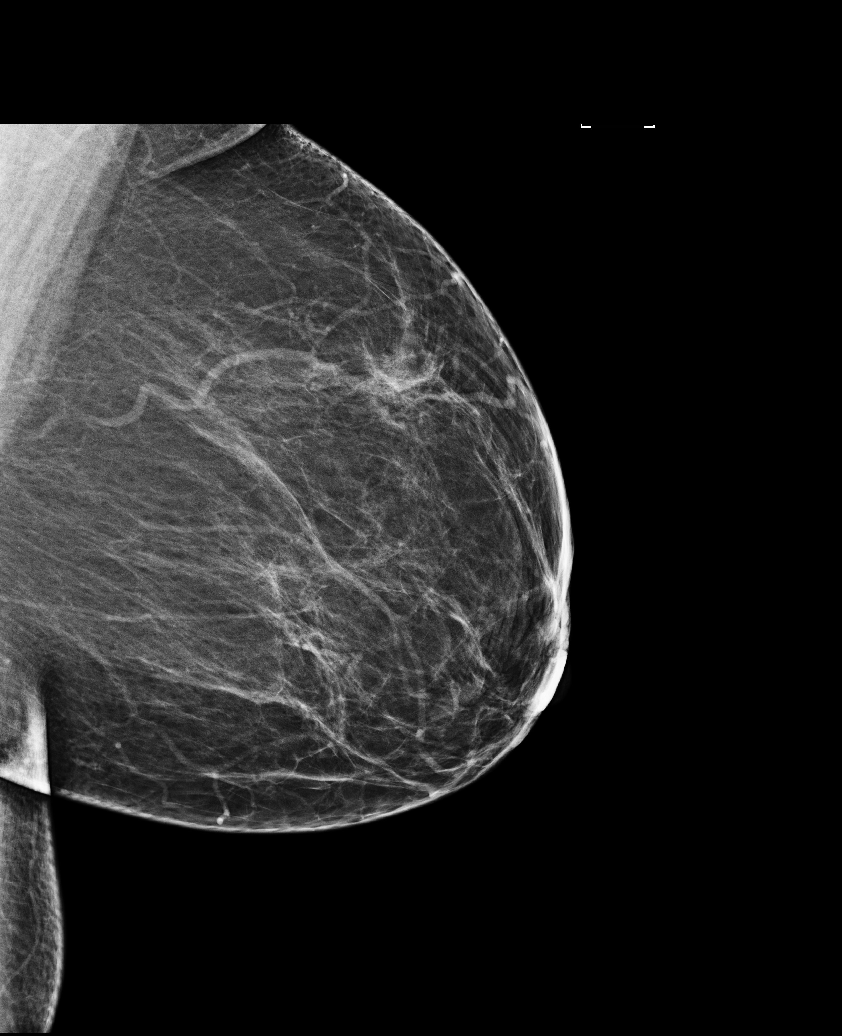

[L CC]
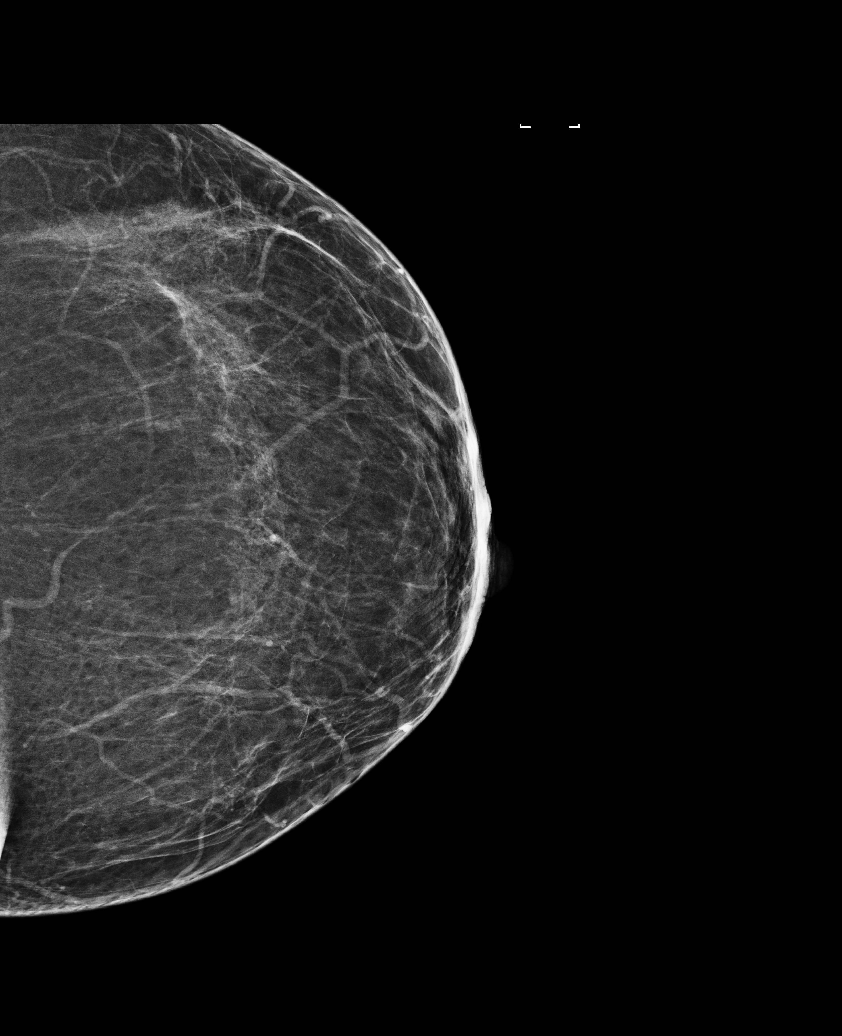

[R MLO (1 of 2)]
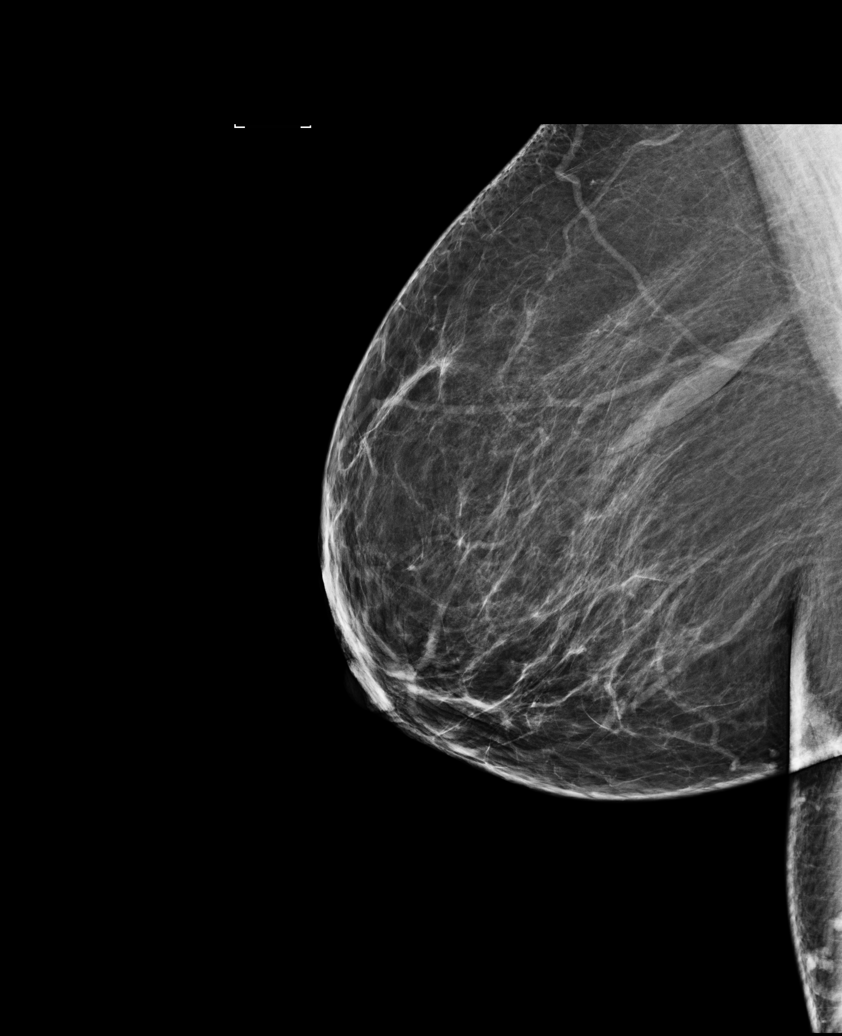

[R CC]
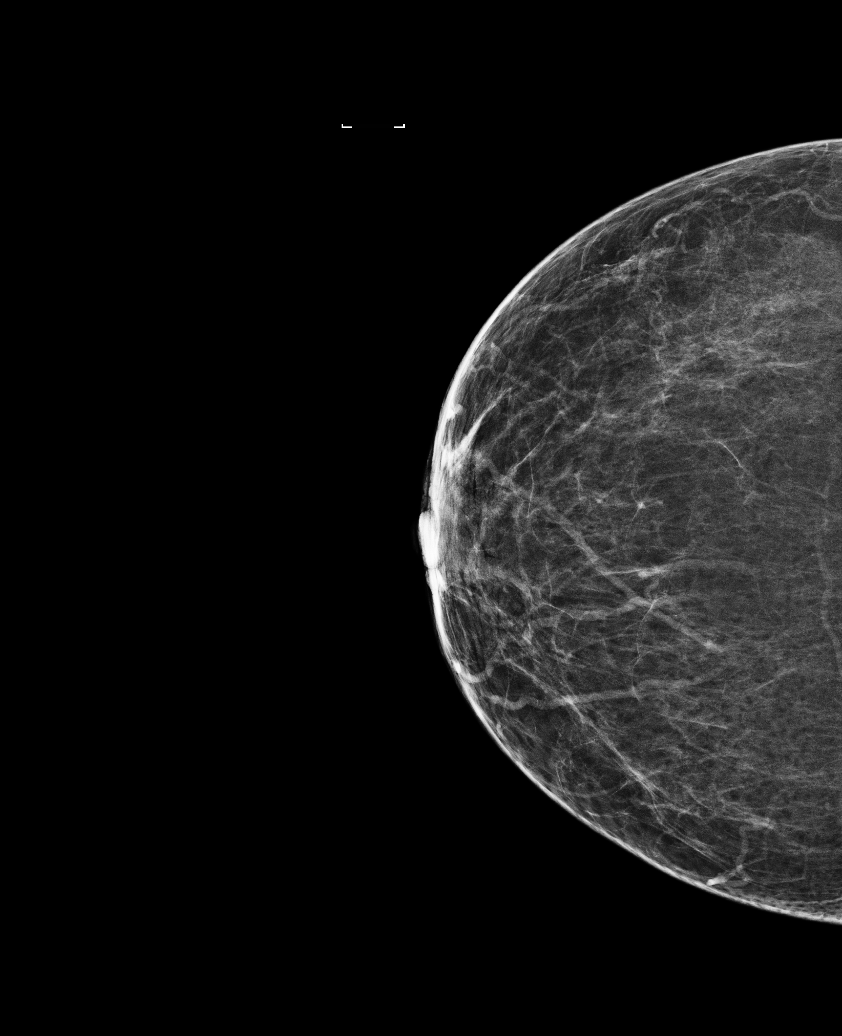

[R MLO (2 of 2)]
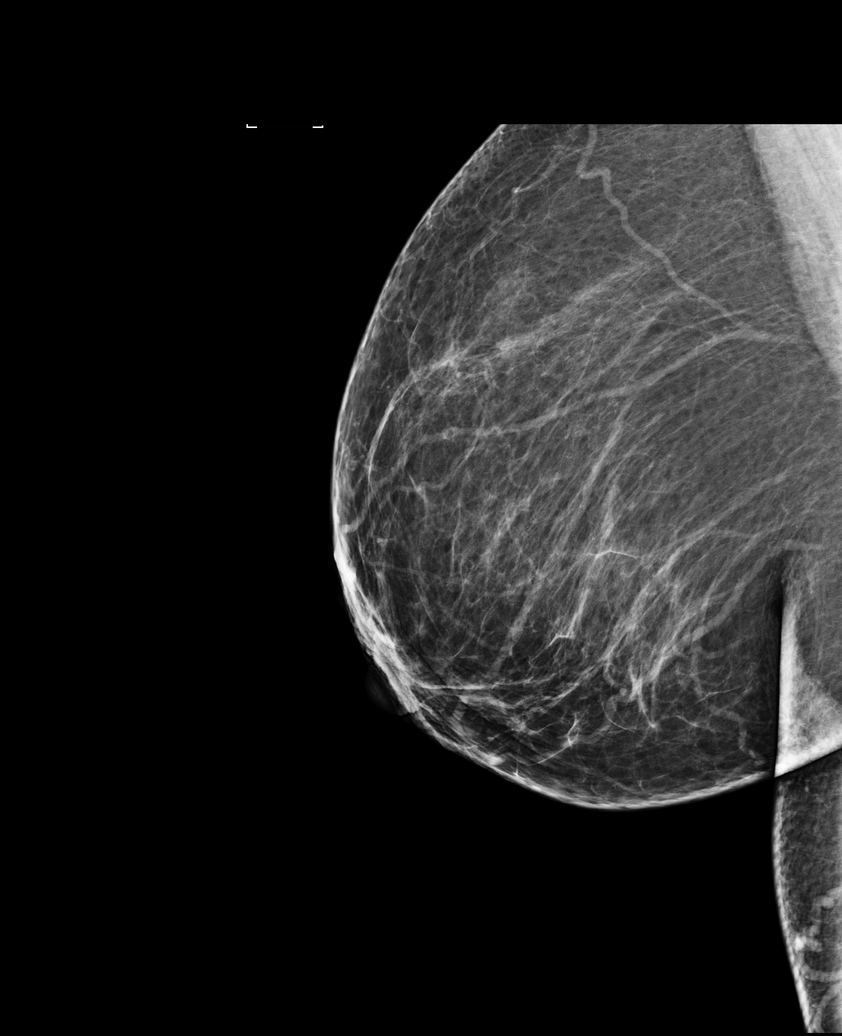

[5 of 5 positions shown; findings below may reference images not displayed]

ACR Breast Density Category b: There are scattered areas of
fibroglandular density.
FINDINGS: There are no findings suspicious for malignancy. Images were
processed with CAD.
IMPRESSION: No mammographic evidence of malignancy. A result letter of this
screening mammogram will be mailed directly to the patient.

RECOMMENDATION:
Screening mammogram in one year. (Code:[id number])

BI-RADS CATEGORY  1: Negative.

## 2021-06-06 ENCOUNTER — Other Ambulatory Visit: Payer: Self-pay

## 2021-06-06 DIAGNOSIS — I10 Essential (primary) hypertension: Secondary | ICD-10-CM

## 2021-06-06 NOTE — Telephone Encounter (Signed)
Olmesartan-amLODIPine-HCTZ 40-5-12.5 MG TABS, refill request @ [id number] - [place], [place] - [address] AT [facility].

## 2021-06-08 MED ORDER — OLMESARTAN-AMLODIPINE-HCTZ 40-5-12.5 MG PO TABS: 1.0000 | ORAL_TABLET | Freq: Every day | ORAL | 2 refills | Status: DC

## 2021-07-29 ENCOUNTER — Ambulatory Visit: Payer: 59 | Admitting: Neurology

## 2021-08-07 NOTE — Progress Notes (Signed)
Pt scheduled for visit today.  Called few hours before appt and requested change to video visit because her back hurt, which was accommodated.  We called her 30 min prior to visit and pt confirmed she would be ready few min before appt time.  Link sent 15 min prior and no response.  Link sent at visit time again and no response.  Waited on hold with visit until 15 min after appt and clicked off (was now on the video link for 30 min as started sending link 15 min prior).

## 2021-08-11 ENCOUNTER — Telehealth (INDEPENDENT_AMBULATORY_CARE_PROVIDER_SITE_OTHER): Payer: 59 | Admitting: Neurology

## 2021-08-11 ENCOUNTER — Other Ambulatory Visit: Payer: Self-pay

## 2021-08-11 ENCOUNTER — Encounter: Payer: Self-pay | Admitting: Neurology

## 2021-08-13 ENCOUNTER — Telehealth: Payer: Self-pay | Admitting: Neurology

## 2021-08-13 ENCOUNTER — Telehealth: Payer: Self-pay | Admitting: Internal Medicine

## 2021-08-13 NOTE — Telephone Encounter (Signed)
Refill Request- Pt states she missed her neurologist appointment and is asking  for the following medications to be filled.  Please see the letter from [facility] in Epic.  carbidopa-levodopa (SINEMET IR) 25-100 MG tablet  pramipexole (MIRAPEX) 0.5 MG tablet

## 2021-08-13 NOTE — Telephone Encounter (Signed)
Carbidopa-levodopa 25-100 MG and pramipexole .5MG  refills needed.  Patient aware she's been discharged but it is within the 30 days. Patient requesting refills.  [facility] 

## 2021-08-14 ENCOUNTER — Other Ambulatory Visit: Payer: Self-pay | Admitting: Neurology

## 2021-08-14 ENCOUNTER — Other Ambulatory Visit: Payer: Self-pay

## 2021-08-14 DIAGNOSIS — G20 Parkinson's disease: Secondary | ICD-10-CM

## 2021-08-14 MED ORDER — CARBIDOPA-LEVODOPA 25-100 MG PO TABS: ORAL_TABLET | ORAL | 0 refills | Status: DC

## 2021-08-14 MED ORDER — PRAMIPEXOLE DIHYDROCHLORIDE 0.5 MG PO TABS: 0.5000 mg | ORAL_TABLET | Freq: Three times a day (TID) | ORAL | 0 refills | Status: DC

## 2021-08-14 NOTE — Telephone Encounter (Signed)
Sent 30 days of medication called patient and LVM that her prescriptions have been sent in

## 2021-08-14 NOTE — Telephone Encounter (Signed)
Dr. Barbaraann Faster, Looks like [facility] will refill this X30 days based off the telephone note from yesterday.  Forwarding to you to make you aware. Thank you, SChaplin, RN,BSN

## 2021-08-15 ENCOUNTER — Telehealth: Payer: Self-pay | Admitting: Neurology

## 2021-08-15 NOTE — Telephone Encounter (Signed)
Patient dismissed from [facility] by Lurena Joiner Tat, DO, effective 08/11/21. Dismissal Letter sent out by 1st class mail. [employer]

## 2021-09-01 ENCOUNTER — Ambulatory Visit (INDEPENDENT_AMBULATORY_CARE_PROVIDER_SITE_OTHER): Payer: 59 | Admitting: Internal Medicine

## 2021-09-01 VITALS — BP 129/97 | HR 90 | Temp 98.0°F | Wt 245.7 lb

## 2021-09-01 DIAGNOSIS — E669 Obesity, unspecified: Secondary | ICD-10-CM

## 2021-09-01 DIAGNOSIS — B356 Tinea cruris: Secondary | ICD-10-CM | POA: Diagnosis not present

## 2021-09-01 DIAGNOSIS — E119 Type 2 diabetes mellitus without complications: Secondary | ICD-10-CM | POA: Insufficient documentation

## 2021-09-01 DIAGNOSIS — Z23 Encounter for immunization: Secondary | ICD-10-CM

## 2021-09-01 DIAGNOSIS — G20 Parkinson's disease: Secondary | ICD-10-CM

## 2021-09-01 DIAGNOSIS — Z1211 Encounter for screening for malignant neoplasm of colon: Secondary | ICD-10-CM

## 2021-09-01 DIAGNOSIS — R7303 Prediabetes: Secondary | ICD-10-CM

## 2021-09-01 DIAGNOSIS — I10 Essential (primary) hypertension: Secondary | ICD-10-CM

## 2021-09-01 LAB — GLUCOSE, CAPILLARY: Glucose-Capillary: 108 mg/dL — ABNORMAL HIGH (ref 70–99)

## 2021-09-01 LAB — POCT GLYCOSYLATED HEMOGLOBIN (HGB A1C): Hemoglobin A1C: 6.4 % — AB (ref 4.0–5.6)

## 2021-09-01 MED ORDER — FLUCONAZOLE 200 MG PO TABS: 200.0000 mg | ORAL_TABLET | ORAL | 1 refills | Status: DC

## 2021-09-01 NOTE — Progress Notes (Addendum)
  Subjective:     Patient ID: Teresa Logan, female   DOB: 1966/07/15, 55 y.o.   MRN: [id number]  Teresa Logan is a 55 y/o who presents for routine follow-up and with new complaint of recurrent rash on her buttocks that initially presented at her previous visit in August. At the time she was given a 56-month regimen of fluconazole and says her rash "95% improved." Since then however, it has gradually returned and has been itchy today. She is also concerned about weight again, as she has gained 30lbs in the past year. Finally, she requests a new neurology referral at today's appointment.  Rash This is a recurrent problem. The current episode started more than 1 month ago. The problem has been gradually worsening since onset. The affected locations include the left buttock. The rash is characterized by itchiness. Pertinent negatives include no diarrhea.    Review of Systems  Cardiovascular:  Negative for leg swelling.  Gastrointestinal:  Positive for constipation. Negative for diarrhea.  Skin:  Positive for rash.  Neurological:  Positive for tremors and light-headedness.       When 'moving too quickly'  Psychiatric/Behavioral:  Negative for hallucinations.       Objective:   Physical Exam Constitutional:      Appearance: Normal appearance.  HENT:     Head: Normocephalic and atraumatic.  Neck:     Comments: Dorsocervical fat pad present Cardiovascular:     Rate and Rhythm: Normal rate and regular rhythm.     Heart sounds: Normal heart sounds.  Pulmonary:     Effort: Pulmonary effort is normal. No respiratory distress.     Breath sounds: Normal breath sounds.  Abdominal:     General: There is no distension.     Palpations: Abdomen is soft.     Tenderness: There is no abdominal tenderness.  Musculoskeletal:     Comments: Right arm tremor present.  Skin:    General: Skin is warm and dry.     Comments: Diaphoretic.  Rash consistent with tinea cruris present on  intergluteal cleft of left buttock.   Neurological:     Mental Status: She is alert.       Assessment & Plan:     See problem-based charting.

## 2021-09-01 NOTE — Assessment & Plan Note (Addendum)
Previous A1c 8/22 was 6.2%. Today 6.4%. Will call patient and discuss starting GLP-1 agonist for pre-diabetes and rapid weight gain.   Addendum: Plan: - Consider starting GLP-1 agonist. Called patient to discuss with no answer.

## 2021-09-01 NOTE — Assessment & Plan Note (Addendum)
BP well-controlled today at 129/97. However, she reports symptoms consistent with orthostatic hypotension, likely related to her Parkinson's. Will plan to obtain orthostatic vitals and alter BP medications if needed.  - Orthostatic vitals - BMP  Addendum: Orthostatic vitals positive, but no change from prior in the setting of Parkinson's. Once she can establish care with neurology, will plan for her to address this issue with neurology and adjust medications as needed.

## 2021-09-01 NOTE — Patient Instructions (Signed)
Thank you for trusting me with your care. To recap, today we discussed the following:   1. Essential hypertension - BMP8+Anion Gap  2. Parkinson's disease (HCC) - Ambulatory referral to Neurology  3. Tinea cruris  - fluconazole (DIFLUCAN) 200 MG tablet; Take 1 tablet (200 mg total) by mouth once a week.  Dispense: 4 tablet; Refill: 1  4. Obesity (BMI 30-39.9) - POC Hbg A1C  5. Screening for colon cancer - Fecal occult blood, imunochemical; Future

## 2021-09-01 NOTE — Assessment & Plan Note (Addendum)
Recurrent pruritic rash in intergluteal cleft of left buttocks. "95% improved" on previous regimen of 150mg  fluconazole weekly for 4 weeks. Will increase dose to 200mg  and repeat dosing of 4 weeks.  - fluconazole (DIFLUCAN) 200 MG tablet; Take 1 tablet (200 mg total) by mouth once a week.  Dispense: 4 tablet; Refill: 1  Addendum: Oral fluconazole prescribed instead of topical antifungals due to size of rash and inability to clear infection previously.

## 2021-09-01 NOTE — Assessment & Plan Note (Signed)
Recently dismissed from [facility] so will refer to [facility] for Parkinson's management. She continues to take Sinemet 25-100mg  and reports no hallucinations or issues with impulse control. Her Sinemet has led to rapid weight gain, including +9lbs in the last week. - Continue Sinemet 25-100mg   - Referral to [facility].

## 2021-09-01 NOTE — Assessment & Plan Note (Addendum)
Weight continues to increase, +9lbs since last visit, +30lbs in the last year, likely side effect of Sinemet. Rapid weight gain is causing distress.  Addendum: Plan: - Consider starting GLP-1 agonist. Called patient to discuss with no answer.

## 2021-09-02 ENCOUNTER — Encounter: Payer: Self-pay | Admitting: Internal Medicine

## 2021-09-02 LAB — BMP8+ANION GAP
Anion Gap: 15 mmol/L (ref 10.0–18.0)
BUN/Creatinine Ratio: 21 (ref 9–23)
BUN: 15 mg/dL (ref 6–24)
CO2: 25 mmol/L (ref 20–29)
Calcium: 9.8 mg/dL (ref 8.7–10.2)
Chloride: 101 mmol/L (ref 96–106)
Creatinine, Ser: 0.71 mg/dL (ref 0.57–1.00)
Glucose: 108 mg/dL — ABNORMAL HIGH (ref 70–99)
Potassium: 4.3 mmol/L (ref 3.5–5.2)
Sodium: 141 mmol/L (ref 134–144)
eGFR: 100 mL/min/{1.73_m2} (ref >59)

## 2021-09-02 NOTE — Progress Notes (Signed)
Attestation for Student Documentation:  I personally was present and performed or re-performed the history, physical exam and medical decision-making activities of this service and have verified that the service and findings are accurately documented in the student's note.  Albertha Ghee, MD 09/02/2021, 9:03 PM

## 2021-09-08 ENCOUNTER — Other Ambulatory Visit: Payer: Self-pay | Admitting: Internal Medicine

## 2021-09-08 ENCOUNTER — Telehealth: Payer: Self-pay | Admitting: Internal Medicine

## 2021-09-08 DIAGNOSIS — G20 Parkinson's disease: Secondary | ICD-10-CM

## 2021-09-08 DIAGNOSIS — B356 Tinea cruris: Secondary | ICD-10-CM

## 2021-09-08 DIAGNOSIS — E785 Hyperlipidemia, unspecified: Secondary | ICD-10-CM

## 2021-09-08 NOTE — Telephone Encounter (Signed)
Called patient to review labs. Call went to voicemail. Left message I will reattempt tomorrow.

## 2021-09-08 NOTE — Telephone Encounter (Signed)
Opened in Error.

## 2021-09-08 NOTE — Telephone Encounter (Signed)
Refill Request   pramipexole (MIRAPEX) 0.5 MG tablet  carbidopa-levodopa (SINEMET IR) 25-100 MG tablet  rosuvastatin (CRESTOR) 10 MG tablet  tolnaftate (TINACTIN) 1 % cream   aspirin EC 81 MG tablet   [facility] #[id number] [place], [place] - [address] AT [facility] (Ph: [phone])

## 2021-09-09 MED ORDER — ASPIRIN EC 81 MG PO TBEC: 81.0000 mg | DELAYED_RELEASE_TABLET | Freq: Every day | ORAL | 3 refills | Status: AC

## 2021-09-09 MED ORDER — TOLNAFTATE 1 % EX CREA: 1.0000 "application " | TOPICAL_CREAM | Freq: Two times a day (BID) | CUTANEOUS | 1 refills | Status: DC

## 2021-09-09 MED ORDER — PRAMIPEXOLE DIHYDROCHLORIDE 0.5 MG PO TABS: 0.5000 mg | ORAL_TABLET | Freq: Three times a day (TID) | ORAL | 1 refills | Status: DC

## 2021-09-09 MED ORDER — CARBIDOPA-LEVODOPA 25-100 MG PO TABS: ORAL_TABLET | ORAL | 1 refills | Status: DC

## 2021-09-09 MED ORDER — ROSUVASTATIN CALCIUM 10 MG PO TABS: ORAL_TABLET | ORAL | 3 refills | Status: DC

## 2021-09-09 NOTE — Progress Notes (Signed)
Internal Medicine Clinic Attending  Case discussed with Dr. Steen  At the time of the visit.  We reviewed the resident's history and exam and pertinent patient test results.  I agree with the assessment, diagnosis, and plan of care documented in the resident's note.  

## 2021-09-12 ENCOUNTER — Other Ambulatory Visit: Payer: Self-pay | Admitting: Internal Medicine

## 2021-09-12 DIAGNOSIS — E669 Obesity, unspecified: Secondary | ICD-10-CM

## 2021-09-12 DIAGNOSIS — R7303 Prediabetes: Secondary | ICD-10-CM

## 2021-09-12 MED ORDER — OZEMPIC (0.25 OR 0.5 MG/DOSE) 2 MG/1.5ML ~~LOC~~ SOPN: 0.2500 mg | PEN_INJECTOR | SUBCUTANEOUS | 0 refills | Status: DC

## 2021-09-12 MED ORDER — SEMAGLUTIDE(0.25 OR 0.5MG/DOS) 2 MG/1.5ML ~~LOC~~ SOPN: 0.5000 mg | PEN_INJECTOR | SUBCUTANEOUS | 1 refills | Status: DC

## 2021-09-12 MED ORDER — CONTOUR NEXT MONITOR W/DEVICE KIT: PACK | 0 refills | Status: DC

## 2021-09-12 NOTE — Progress Notes (Signed)
1. Pre-diabetes - Semaglutide,0.25 or 0.5MG/DOS, (OZEMPIC, 0.25 OR 0.5 MG/DOSE,) 2 MG/1.5ML SOPN; Inject 0.25 mg into the skin once a week for 28 days.  Dispense: 1.5 mL; Refill: 0 - Semaglutide,0.25 or 0.5MG/DOS, 2 MG/1.5ML SOPN; Inject 0.5 mg into the skin once a week.  Dispense: 1.5 mL; Refill: 1 - Blood Glucose Monitoring Suppl (CONTOUR NEXT MONITOR) w/Device KIT; Check blood glucose as needed  Dispense: 1 kit; Refill: 0  2. Obesity (BMI 30-39.9) - Semaglutide,0.25 or 0.5MG/DOS, (OZEMPIC, 0.25 OR 0.5 MG/DOSE,) 2 MG/1.5ML SOPN; Inject 0.25 mg into the skin once a week for 28 days.  Dispense: 1.5 mL; Refill: 0 - Semaglutide,0.25 or 0.5MG/DOS, 2 MG/1.5ML SOPN; Inject 0.5 mg into the skin once a week.  Dispense: 1.5 mL; Refill: 1

## 2021-09-18 ENCOUNTER — Other Ambulatory Visit: Payer: Self-pay

## 2021-09-18 DIAGNOSIS — R7303 Prediabetes: Secondary | ICD-10-CM

## 2021-09-18 MED ORDER — CONTOUR NEXT MONITOR W/DEVICE KIT: PACK | 0 refills | Status: DC

## 2021-09-18 MED ORDER — ACCU-CHEK MULTICLIX LANCETS MISC: 12 refills | Status: DC

## 2021-09-18 MED ORDER — ACCU-CHEK GUIDE VI STRP: ORAL_STRIP | 12 refills | Status: DC

## 2021-09-23 ENCOUNTER — Telehealth: Payer: Self-pay

## 2021-09-23 NOTE — Telephone Encounter (Signed)
Pa for pt ( CONTOUR NEXT BLOOD GLUCOSE METER ) came through VIA fax was done and submitted  through cover my meds with office notes and labs from 12/5... awaiting approval or denial     UPDATE :   [employer] is reviewing your PA request. Typically an electronic response will be received within 24-72 hours.

## 2021-09-30 NOTE — Telephone Encounter (Signed)
DECISION :     Approved on September 23, 2021  Request Reference Number: [id number].   CONTOUR NEXT KIT GEN is approved through 09/27/2022.  Your patient may now fill this prescription and it will be covered.   Drug Contour Next Gen Monitor w/Device kit  Form  [employer] Medicare Part D Electronic Prior Authorization Form (2017 NCPDP)     ( COPY SENT TO PHARMACY ALSO )

## 2021-11-03 ENCOUNTER — Encounter: Payer: Self-pay | Admitting: Neurology

## 2021-11-03 ENCOUNTER — Ambulatory Visit (INDEPENDENT_AMBULATORY_CARE_PROVIDER_SITE_OTHER): Payer: 59 | Admitting: Neurology

## 2021-11-03 VITALS — BP 134/90 | HR 93 | Ht 66.0 in | Wt 249.8 lb

## 2021-11-03 DIAGNOSIS — G20 Parkinson's disease: Secondary | ICD-10-CM

## 2021-11-03 MED ORDER — CARBIDOPA-LEVODOPA 25-100 MG PO TABS: ORAL_TABLET | ORAL | 5 refills | Status: DC

## 2021-11-03 MED ORDER — PRAMIPEXOLE DIHYDROCHLORIDE 0.5 MG PO TABS: 0.5000 mg | ORAL_TABLET | Freq: Three times a day (TID) | ORAL | 5 refills | Status: DC

## 2021-11-03 NOTE — Progress Notes (Signed)
Subjective:    Patient ID: Teresa Logan is a 56 y.o. female.  HPI    Star Age, MD, PhD [facility] [address], [address] [place], [place] [address]  Dear Drs. Wille Glaser,   I saw your patient, Teresa Logan, upon your kind request and my neurologic clinic today for initial consultation of her parkinsonism.  The patient is unaccompanied today.  As you know, Teresa Logan is a 56 year old right-handed woman with an underlying medical history of hypertension, kidney stones, prediabetes, and obesity, who was previously diagnosed with Parkinson's disease.  She had follow-up with Dr. Wells Guiles until May 2022, and started seeing Dr. Carles Collet in 04/21.  Prior to that, patient was followed by Dr. Jeni Salles, [place] where patient previously lived.  Patient reports symptoms dating back to 2013 when she first started having of right foot tremor.  She had a DaTscan in or around 2014 which supported Parkinson's disease.  She was lost to follow-up until 2018 or 19 and started treatment with levodopa with her previous neurologist.  She feels that the medication has been helpful.  She currently takes levodopa 2 pills in the morning, 2 pills midday and 1 pill in the afternoon along with pramipexole 0.5 mg 3 times daily.  I reviewed Dr. Doristine Devoid office notes from 02/20/2021, as well as 01/15/2020.  She has had some weight gain.  She was advised to continue with generic Sinemet 25-100 mg strength as well as pramipexole 0.5 mg strength 1 pill 3 times daily.  She has been on pramipexole since November 2021. She has had intermittent constipation, has a bowel movement every other day approximately.  She does not always hydrate well and estimates that she drinks about 1 bottle of water per day on average.  She denies a family history of Parkinson's disease.  She has had 1 fall in 2022.  She reports that she fell in the bathroom at her daughter's house in  [place].  She does not typically use a cane or walker.  She has had therapy.  She drinks alcohol daily in the form of 1 glass of wine or 1 beer.  She does not drink caffeine daily.  She does not have any records from her neurologist in [place].  She does not know where they are.  A DaTscan report was not available for my review today but some records were not available apparently when patient saw Dr. Carles Collet initially.  Her Past Medical History Is Significant For: Past Medical History:  Diagnosis Date   HTN (hypertension)    Kidney stones    Parkinson's disease ([place])    Tremors of nervous system     Her Past Surgical History Is Significant For: Past Surgical History:  Procedure Laterality Date   TONSILLECTOMY      Her Family History Is Significant For: Family History  Problem Relation Age of Onset   Diabetes type II Mother    Alzheimer's disease Mother    Diabetes type II Father    Kidney disease Father    Healthy Son    Healthy Son    Healthy Son    Healthy Son     Her Social History Is Significant For: Social History   Socioeconomic History   Marital status: Single    Spouse name: Not on file   Number of children: Not on file   Years of education: Not on file   Highest education level: Not on file  Occupational History   Not on file  Tobacco Use   Smoking status: Never    Passive exposure: Yes   Smokeless tobacco: Never  Vaping Use   Vaping Use: Never used  Substance and Sexual Activity   Alcohol use: Yes    Alcohol/week: 2.0 standard drinks    Types: 1 Glasses of wine, 1 Cans of beer per week    Comment: 1 drink per day, either wine or beer   Drug use: Never   Sexual activity: Yes  Other Topics Concern   Not on file  Social History Narrative   Right hand   One story home    Lives with domestic partner   Caffeine none .    Retired Marine scientist (traveler).  Retired 2019, disabled.     Social Determinants of Health   Financial Resource Strain: Not on file  Food  Insecurity: Not on file  Transportation Needs: Not on file  Physical Activity: Not on file  Stress: Not on file  Social Connections: Not on file    Her Allergies Are:  No Known Allergies:   Her Current Medications Are:  Outpatient Encounter Medications as of 11/03/2021  Medication Sig   aspirin EC 81 MG tablet Take 1 tablet (81 mg total) by mouth daily.   Blood Glucose Monitoring Suppl (CONTOUR NEXT MONITOR) w/Device KIT Check blood glucose as needed   carbidopa-levodopa (SINEMET IR) 25-100 MG tablet 2 at 8am/2 at noon/1 at 4pm   glucose blood (ACCU-CHEK GUIDE) test strip Use as instructed   ibuprofen (ADVIL,MOTRIN) 600 MG tablet Take 1 tablet (600 mg total) by mouth every 6 (six) hours as needed.   Lancets (ACCU-CHEK MULTICLIX) lancets Use as instructed   Olmesartan-amLODIPine-HCTZ 40-5-12.5 MG TABS Take 1 tablet by mouth daily.   pramipexole (MIRAPEX) 0.5 MG tablet Take 1 tablet (0.5 mg total) by mouth 3 (three) times daily.   rosuvastatin (CRESTOR) 10 MG tablet TAKE 1 TABLET(10 MG) BY MOUTH DAILY   tolnaftate (TINACTIN) 1 % cream Apply 1 application topically 2 (two) times daily.   Semaglutide,0.25 or 0.5MG/DOS, (OZEMPIC, 0.25 OR 0.5 MG/DOSE,) 2 MG/1.5ML SOPN Inject 0.25 mg into the skin once a week for 28 days.   Semaglutide,0.25 or 0.5MG/DOS, 2 MG/1.5ML SOPN Inject 0.5 mg into the skin once a week. (Patient not taking: Reported on 11/03/2021)   [DISCONTINUED] fluconazole (DIFLUCAN) 200 MG tablet Take 1 tablet (200 mg total) by mouth once a week.   No facility-administered encounter medications on file as of 11/03/2021.  :   Review of Systems:  Out of a complete 14 point review of systems, all are reviewed and negative with the exception of these symptoms as listed below:  Review of Systems  Neurological:        Parkinson's Disease. On sinemet 25/100 taking 2-2-1, mirapex 0.57m TId.  Better with medications.  Re-establish with another provider.   Objective:  Neurological  Exam  Physical Exam Physical Examination:   Vitals:   11/03/21 0751  BP: 134/90  Pulse: 93  SpO2: 95%    General Examination: The patient is a very pleasant 56y.o. female in no acute distress. She appears and well groomed.   HEENT: Normocephalic, atraumatic, pupils are equal, round and reactive to light, extraocular tracking is good without limitation to gaze excursion or nystagmus noted. Hearing is grossly intact. Face is symmetric with no facial masking, no significant nuchal rigidity noted.  She does not have a decreased blink rate.  Voice is hypophonic and no  dysarthria is noted, no carotid bruits.  Airway examination reveals mild mouth dryness, tongue protrudes centrally and palate elevates symmetrically.   No lip, neck or jaw tremor.  Chest: Clear to auscultation without wheezing, rhonchi or crackles noted.  Heart: S1+S2+0, regular and normal without murmurs, rubs or gallops noted.   Abdomen: Soft, non-tender and non-distended.  Extremities: There is no pitting edema in the distal lower extremities bilaterally.   Skin: Warm and dry without trophic changes noted.   Musculoskeletal: exam reveals no obvious joint deformities.   Neurologically:  Mental status: The patient is awake, alert and oriented in all 4 spheres. Her immediate and remote memory, attention, language skills and fund of knowledge are appropriate. There is no evidence of aphasia, agnosia, apraxia or anomia. Speech is clear with normal prosody and enunciation. Thought process is linear. Mood is normal and affect is normal.  Cranial nerves II - XII are as described above under HEENT exam.  Motor exam: Normal bulk, and strength, minimal increase in tone in the right upper extremity.  She has a intermittent resting tremor in both upper extremities, right side more consistent than the left.  She has an intermittent right foot tremor.  She has fairly good fine motor control, no obvious decrement in amplitude with finger  taps but mild decrement in amplitude with foot taps bilaterally.  She has no drift or rebound.    On 11/03/2021: On Archimedes spiral drawing she was minimal trembling with the left and mild trembling with the right.  Handwriting is legible, not tremulous, not micrographic.    Cerebellar testing: No dysmetria or intention tremor. There is no truncal or gait ataxia.   Sensory exam: intact to light touch in the upper and lower extremities.  Gait, station and balance: She stands easily. No veering to one side is noted. No leaning to one side is noted. Posture is mildly stooped for age, she walks with good stride length and pace, no shuffling, no freezing, no start hesitation, notable tremor in both upper extremities with walking.  Balance is preserved.   Assessment and Plan:   In summary, Teresa Logan is a very pleasant 56 y.o.-year old female with an underlying medical history of hypertension, kidney stones, prediabetes, and obesity, who presents for evaluation of her Parkinson's disease.  History and examination support right-sided predominant Parkinson's disease but she does have an interesting constellation of findings, no telltale hypophonia or masked facies, good stride length and pace, minimal increase in tone.  Given the extent of time she has had symptoms, it is remarkable that she has a milder presentation and no significant decrement in finger taps and only minimal increase in tone.  However, she has also responded well to medication from what I can see, and she had a supportive DaTscan.  We will try to get records from her neurologist from [place], including her DaTscan report.  She signed a release of information form today.  She is encouraged to continue with her current medication regimen and I renewed her prescriptions for Sinemet and Mirapex generic.  Advised to stay hydrated with water and avoid drinking alcohol daily. She is advised to be proactive about constipation issues and  add a stool softener and/or fiber supplement to her diet.  She can also add a laxative as needed.  She is advised to follow-up with one of our nurse practitioners in about 4 months, sooner if needed.  She is encouraged to sign up for MyChart.  I answered all  her questions today and she was in agreement with our plan.   Thank you very much for allowing me to participate in the care of this nice patient. If I can be of any further assistance to you please do not hesitate to call me at [phone].  Sincerely,   Star Age, MD, PhD

## 2021-11-03 NOTE — Patient Instructions (Addendum)
It was nice to meet you today.  As discussed, we will try to get the DaT scan report and office notes from your neurologist in [facility].  Please bring any records you may have.  We will continue your current medications at the current doses and times.  Please add a stool softener and/or fiber to your daily diet.  Follow up in about 4 months to see the Nurse Practitioner.

## 2021-12-17 ENCOUNTER — Other Ambulatory Visit: Payer: Self-pay | Admitting: Internal Medicine

## 2021-12-17 DIAGNOSIS — B356 Tinea cruris: Secondary | ICD-10-CM

## 2021-12-17 MED ORDER — TOLNAFTATE 1 % EX CREA: 1.0000 "application " | TOPICAL_CREAM | Freq: Two times a day (BID) | CUTANEOUS | 1 refills | Status: DC

## 2021-12-17 NOTE — Telephone Encounter (Signed)
Refill Request ? ? ?tolnaftate (TINACTIN) 1 % cream ? ?[id number] [place], [place] - [address] AT [facility] (Ph: [phone]) ?

## 2021-12-18 ENCOUNTER — Telehealth: Payer: Self-pay | Admitting: *Deleted

## 2021-12-18 NOTE — Telephone Encounter (Signed)
Received fax from [facility] for refill on Fluconazole 200 mg Take one tab once a week. Not on current med list. Pt was called - stated the rash went away but it came back. Requesting a refill. ?Thanks ? ?

## 2021-12-19 ENCOUNTER — Other Ambulatory Visit: Payer: Self-pay | Admitting: Internal Medicine

## 2021-12-19 DIAGNOSIS — B356 Tinea cruris: Secondary | ICD-10-CM

## 2021-12-19 MED ORDER — FLUCONAZOLE 200 MG PO TABS: 200.0000 mg | ORAL_TABLET | ORAL | 1 refills | Status: DC

## 2021-12-19 NOTE — Progress Notes (Signed)
Recurrent Tinea Cruris. Fluconazole sent to pharmacy. Patient has follow up with me in a few weeks.  ?

## 2021-12-29 ENCOUNTER — Encounter: Payer: Self-pay | Admitting: Internal Medicine

## 2021-12-29 ENCOUNTER — Ambulatory Visit (INDEPENDENT_AMBULATORY_CARE_PROVIDER_SITE_OTHER): Payer: 59 | Admitting: Internal Medicine

## 2021-12-29 VITALS — BP 117/83 | HR 91 | Temp 98.4°F | Ht 66.0 in | Wt 254.0 lb

## 2021-12-29 DIAGNOSIS — Z23 Encounter for immunization: Secondary | ICD-10-CM

## 2021-12-29 DIAGNOSIS — Z1211 Encounter for screening for malignant neoplasm of colon: Secondary | ICD-10-CM

## 2021-12-29 DIAGNOSIS — E669 Obesity, unspecified: Secondary | ICD-10-CM

## 2021-12-29 DIAGNOSIS — R7303 Prediabetes: Secondary | ICD-10-CM

## 2021-12-29 DIAGNOSIS — I10 Essential (primary) hypertension: Secondary | ICD-10-CM

## 2021-12-29 DIAGNOSIS — E119 Type 2 diabetes mellitus without complications: Secondary | ICD-10-CM

## 2021-12-29 DIAGNOSIS — H538 Other visual disturbances: Secondary | ICD-10-CM | POA: Diagnosis not present

## 2021-12-29 DIAGNOSIS — Z1231 Encounter for screening mammogram for malignant neoplasm of breast: Secondary | ICD-10-CM

## 2021-12-29 LAB — POCT GLYCOSYLATED HEMOGLOBIN (HGB A1C): Hemoglobin A1C: 6.5 % — AB (ref 4.0–5.6)

## 2021-12-29 LAB — GLUCOSE, CAPILLARY: Glucose-Capillary: 145 mg/dL — ABNORMAL HIGH (ref 70–99)

## 2021-12-29 MED ORDER — ZOSTER VAC RECOMB ADJUVANTED 50 MCG/0.5ML IM SUSR: 0.5000 mL | Freq: Once | INTRAMUSCULAR | 1 refills | Status: AC

## 2021-12-29 NOTE — Assessment & Plan Note (Deleted)
Patient continues to have her weight trend.  ?

## 2021-12-29 NOTE — Patient Instructions (Signed)
Thank you for trusting me with your care. To recap, today we discussed the following: ? ? ?1. Screening for colon cancer ?- Fecal occult blood, imunochemical ? ?2. Blurry vision ?- Ambulatory referral to Ophthalmology ? ?3. Pre-diabetes ?- Ambulatory referral to Ophthalmology ?- POC Hbg A1C ? ?4. Primary hypertension ?- BMP8+Anion Gap ?-BP: 117/83  ?-Continue current medications ? ?5. Need for shingles vaccine ?- Zoster Vaccine Adjuvanted [facility]) injection; Inject 0.5 mLs into the muscle once for 1 dose.  Dispense: 0.5 mL; Refill: 1 ? ?6. Obesity (BMI 30-39.9) ?-Start semaglutide ? ?7. Encounter for screening mammogram for malignant neoplasm of breast ?- MM Digital Screening ? ? ? ?

## 2021-12-29 NOTE — Progress Notes (Signed)
? ?  CC: Prediabetes , primary hypertension, need for shingles vaccine need for colon cancer screening, blurry vision and need referral to ophthalmology, need for screening mammogram ? ?HPI:[name] who presents for evaluation of prediabetes , primary hypertension, need for shingles vaccine ,need for colon cancer screening, blurry vision when reading and needs referral to ophthalmology, need for screening mammogram. Please see individual problem based A/P for details. ? ? ?Past Medical History:  ?Diagnosis Date  ? HTN (hypertension)   ? Kidney stones   ? Parkinson's disease ([place])   ? Tremors of nervous system   ? ?Review of Systems:   ?Review of Systems  ?Constitutional:  Negative for chills and fever.  ?Musculoskeletal:  Negative for falls.  ?Neurological:  Positive for tremors (increased). Negative for dizziness.   ? ?Physical Exam: ?Vitals:  ? 12/29/21 1403  ?BP: 117/83  ?Pulse: 91  ?Temp: 98.4 ?F (36.9 ?C)  ?TempSrc: Oral  ?SpO2: 99%  ?Weight: 254 lb (115.2 kg)  ?Height: 5\' 6"  (1.676 m)  ? ?General: NAD, pleasant mood  ?HEENT: Conjunctiva nl , antiicteric sclerae, moist mucous membranes, no exudate or erythema ?Cardiovascular: Normal rate, regular rhythm.  No murmurs, rubs, or gallops ?Pulmonary : Equal breath sounds, No wheezes, rales, or rhonchi ?Abdominal: soft, nontender,  bowel sounds present ?Ext: No edema in lower extremities, no tenderness to palpation of lower extremities. Resting Tremor in right arm.  ? ?Assessment & Plan:  ? ?See Encounters Tab for problem based charting. ? ?Patient discussed with Dr. Angelia Mould ? ?

## 2021-12-30 ENCOUNTER — Encounter: Payer: Self-pay | Admitting: Internal Medicine

## 2021-12-30 DIAGNOSIS — Z23 Encounter for immunization: Secondary | ICD-10-CM | POA: Insufficient documentation

## 2021-12-30 DIAGNOSIS — H538 Other visual disturbances: Secondary | ICD-10-CM | POA: Insufficient documentation

## 2021-12-30 HISTORY — DX: Encounter for immunization: Z23

## 2021-12-30 LAB — BMP8+ANION GAP
Anion Gap: 18 mmol/L (ref 10.0–18.0)
BUN/Creatinine Ratio: 14 (ref 9–23)
BUN: 11 mg/dL (ref 6–24)
CO2: 20 mmol/L (ref 20–29)
Calcium: 10 mg/dL (ref 8.7–10.2)
Chloride: 99 mmol/L (ref 96–106)
Creatinine, Ser: 0.77 mg/dL (ref 0.57–1.00)
Glucose: 130 mg/dL — ABNORMAL HIGH (ref 70–99)
Potassium: 4.6 mmol/L (ref 3.5–5.2)
Sodium: 137 mmol/L (ref 134–144)
eGFR: 90 mL/min/{1.73_m2} (ref >59)

## 2021-12-30 MED ORDER — ONETOUCH DELICA LANCETS 33G MISC: 1.0000 | Freq: Every day | 12 refills | Status: DC | PRN

## 2021-12-30 MED ORDER — ONETOUCH ULTRA MINI W/DEVICE KIT: 1.0000 | PACK | Freq: Every day | 0 refills | Status: DC | PRN

## 2021-12-30 MED ORDER — GLUCOSE BLOOD VI STRP: ORAL_STRIP | 12 refills | Status: DC

## 2021-12-30 NOTE — Assessment & Plan Note (Signed)
-   prescription for shingles vaccine sent to pharmacy.  ? ?

## 2021-12-30 NOTE — Assessment & Plan Note (Signed)
BP: 117/83 Compliant with current medication regimen. No recent episodes of hypotension or swelling in her lower extremities. Checking BMP today.  ?- Continue Olmesartan-amlodopine-HCTZ 40-5-12.5 mg   ?

## 2021-12-30 NOTE — Assessment & Plan Note (Signed)
Last mammogram 2 years ago. Sending for routine mammogram for breast cancer screening.  ?

## 2021-12-30 NOTE — Assessment & Plan Note (Signed)
Patient return fecal immunochemical test today.  ?

## 2021-12-30 NOTE — Assessment & Plan Note (Signed)
Bilateral blurry vision which has been gradually. Uses reading glasses. No recent eye exam. Referral to opthalmology for vision testing.  ?

## 2021-12-30 NOTE — Assessment & Plan Note (Addendum)
Hemoglobin A1c 6.5, diet controlled. Patient has gained weight with starting Sinamet. Not interested in starting GLP-1 at this time. Her diet is not structured and we discussed meeting with RD in our clinic. She is motivated to make changes with her diet and learn more about food choices.  ? ?Type 2 diabetes mellitus without complications or long term current use of insulin. new diagnosis, diet controlled.  ?- Ambulatory referral to Ophthalmology ?- POC Hbg A1C ?- Referral to Nutrition and Diabetes Services ?- Blood Glucose Monitoring Suppl (ONE TOUCH ULTRA MINI) w/Device KIT; 1 kit by Does not apply route daily as needed (For hypergycemia, signs can be increased urination, increased thirst, or nausea.).  Dispense: 1 kit; Refill: 0 ?- glucose blood test strip; Use as instructed  Dispense: 100 each; Refill: 12 ?- OneTouch Delica Lancets 16O MISC; 1 each by Does not apply route daily as needed (for hyperglycemia).  Dispense: 100 each; Refill: 12 ? ? ? ? ?

## 2021-12-31 LAB — FECAL OCCULT BLOOD, IMMUNOCHEMICAL: Fecal Occult Bld: NEGATIVE

## 2022-01-08 ENCOUNTER — Encounter: Payer: 59 | Admitting: Internal Medicine

## 2022-01-13 NOTE — Progress Notes (Signed)
Internal Medicine Clinic Attending  Case discussed with Dr. Steen  At the time of the visit.  We reviewed the resident's history and exam and pertinent patient test results.  I agree with the assessment, diagnosis, and plan of care documented in the resident's note.  

## 2022-01-19 ENCOUNTER — Ambulatory Visit (INDEPENDENT_AMBULATORY_CARE_PROVIDER_SITE_OTHER): Payer: 59 | Admitting: Internal Medicine

## 2022-01-19 ENCOUNTER — Encounter: Payer: Self-pay | Admitting: Internal Medicine

## 2022-01-19 ENCOUNTER — Ambulatory Visit
Admission: RE | Admit: 2022-01-19 | Discharge: 2022-01-19 | Disposition: A | Discharge status: Home / Self Care | Payer: 59 | Source: Ambulatory Visit | Attending: Internal Medicine | Admitting: Internal Medicine

## 2022-01-19 ENCOUNTER — Other Ambulatory Visit (HOSPITAL_COMMUNITY)
Admission: RE | Admit: 2022-01-19 | Discharge: 2022-01-19 | Disposition: A | Discharge status: Home / Self Care | Payer: 59 | Source: Ambulatory Visit | Attending: Internal Medicine | Admitting: Internal Medicine

## 2022-01-19 VITALS — BP 120/86 | HR 87 | Temp 98.3°F | Ht 66.0 in | Wt 253.8 lb

## 2022-01-19 DIAGNOSIS — L304 Erythema intertrigo: Secondary | ICD-10-CM

## 2022-01-19 DIAGNOSIS — Z01419 Encounter for gynecological examination (general) (routine) without abnormal findings: Secondary | ICD-10-CM | POA: Diagnosis present

## 2022-01-19 DIAGNOSIS — Z124 Encounter for screening for malignant neoplasm of cervix: Secondary | ICD-10-CM | POA: Insufficient documentation

## 2022-01-19 DIAGNOSIS — Z23 Encounter for immunization: Secondary | ICD-10-CM | POA: Insufficient documentation

## 2022-01-19 DIAGNOSIS — Z1151 Encounter for screening for human papillomavirus (HPV): Secondary | ICD-10-CM | POA: Insufficient documentation

## 2022-01-19 MED ORDER — NYSTATIN 100000 UNIT/GM EX POWD: 1.0000 "application " | Freq: Every day | CUTANEOUS | 0 refills | Status: DC

## 2022-01-19 MED ORDER — KETOCONAZOLE 2 % EX CREA: 1.0000 "application " | TOPICAL_CREAM | Freq: Every day | CUTANEOUS | 0 refills | Status: AC

## 2022-01-19 NOTE — Assessment & Plan Note (Signed)
Patient endorses discomfort in and around the intergluteal fold.  She states that she has been experiencing this over the last 3 months or so and that she has been taking Diflucan once weekly with topical tolnaftate cream each day without ever experiencing full resolution of her symptoms.  She does say that sometimes she notices the rash beginning to go away however it has not fully resolved previously.  She has a lot of itching to the area but denies any pain. ? ?Around the onset of this rash she denies any new laundry detergents, soaps, bathroom wipes.  She has not had fever or chills.  She is not prone to yeast infections and she is not currently taking Ozempic for her diabetes. ?Assessment: Patient previously treated for tinea cruris however given her lack of adequate or complete response to treatment combined with physical exam findings I am concerned about intertrigo. ?Plan: I have prescribed nystatin powder once daily and ketoconazole cream 1 application daily and advised the patient to use 1 in the morning and 1 at night, which ever she prefers.  I have advised that she not continue using the cream she already has at home.  I have also placed a referral for dermatology given the persistent nature of her pruritic rash.  I have requested that she follow-up in about 4 weeks so that we can make sure she is having appropriate response to our medical management.  At that time please make sure that she was able to get a appointment scheduled with dermatology. ?

## 2022-01-19 NOTE — Progress Notes (Signed)
? ?CC: Itching rash ? ?HPI: ? ?[name] is a 56 y.o. female with past medical history as detailed below who presents today with a complaint of itchy rash in her genital area and for a Pap smear.  Please see promise charting for detail assessment and plan. ? ?Past Medical History:  ?Diagnosis Date  ? Breast cancer screening   ? Encounter for hepatitis C screening test for low risk patient 08/28/2020  ? Encounter for screening for HIV 08/28/2020  ? HTN (hypertension)   ? Kidney stones   ? Need for shingles vaccine 12/30/2021  ? Parkinson's disease ([facility])   ? Screening for colorectal cancer   ? Tremors of nervous system   ? ?Review of Systems:   ?Review of Systems  ?Constitutional:  Negative for chills and fever.  ?Genitourinary:  Negative for dysuria.  ?Skin:  Positive for itching and rash.  ?Neurological:  Positive for tremors. Negative for focal weakness.  ? ? ?Physical Exam: ? ?Vitals:  ? 01/19/22 1322  ?BP: 120/86  ?Pulse: 87  ?Temp: 98.3 ?F (36.8 ?C)  ?TempSrc: Oral  ?SpO2: 99%  ?Weight: 253 lb 12.8 oz (115.1 kg)  ?Height: 5\' 6"  (1.676 m)  ? ?Constitutional: Well-appearing female in no acute distress. ?Cardio: Regular rate and rhythm.  No murmurs, rubs, gallops. ?Pulm: Clear to auscultation bilaterally ?GU: Teresa Logan, , present as Teresa Logan.  Labia are without rash or lesions.  Vaginal canal without erythema or discharge.  Cervix is nonfriable and nonbleeding.  Patient does have a shiny, red-purple discolored skin lesion within the intergluteal fold on bilateral skin surfaces that does not have scale, excoriations, bleeding.  It has mild tenderness on palpation.  No extension of the discolored skin lesion to the vulvar area. ?Neuro: Alert and oriented x3.  No focal deficit noted. ?Psych: Pleasant mood and affect. ? ? ?Assessment & Plan:  ? ?Need for tetanus booster ?For insurance reasons patient is unable to get tetanus booster in our clinic covered today.  I have advised that she go to her  pharmacy and request this vaccination. ? ?Pap smear for cervical cancer screening ?Patient has not had Pap smear in several years; in fact she does not quite remember when she last had it done. ?Plan: Pap smear obtained today.  I will call patient with results. ? ?Intertrigo ?Patient endorses discomfort in and around the intergluteal fold.  She states that she has been experiencing this over the last 3 months or so and that she has been taking Diflucan once weekly with topical tolnaftate cream each day without ever experiencing full resolution of her symptoms.  She does say that sometimes she notices the rash beginning to go away however it has not fully resolved previously.  She has a lot of itching to the area but denies any pain. ? ?Around the onset of this rash she denies any new laundry detergents, soaps, bathroom wipes.  She has not had fever or chills.  She is not prone to yeast infections and she is not currently taking Ozempic for her diabetes. ?Assessment: Patient previously treated for tinea cruris however given her lack of adequate or complete response to treatment combined with physical exam findings I am concerned about intertrigo. ?Plan: I have prescribed nystatin powder once daily and ketoconazole cream 1 application daily and advised the patient to use 1 in the morning and 1 at night, which ever she prefers.  I have advised that she not continue using the cream she already  has at home.  I have also placed a referral for dermatology given the persistent nature of her pruritic rash.  I have requested that she follow-up in about 4 weeks so that we can make sure she is having appropriate response to our medical management.  At that time please make sure that she was able to get a appointment scheduled with dermatology. ? ? ?See Encounters Tab for problem based charting. ? ?Patient discussed with Dr.  Sol Blazing ? ?

## 2022-01-19 NOTE — Assessment & Plan Note (Signed)
For insurance reasons patient is unable to get tetanus booster in our clinic covered today.  I have advised that she go to her pharmacy and request this vaccination. ?

## 2022-01-19 NOTE — Assessment & Plan Note (Signed)
Patient has not had Pap smear in several years; in fact she does not quite remember when she last had it done. ?Plan: Pap smear obtained today.  I will call patient with results. ?

## 2022-01-19 NOTE — Patient Instructions (Addendum)
It was nice seeing you today! Thank you for choosing [facility] for your Primary Care.  ?  ?Today we talked about:  ? ?Your itchy rash: I'm sorry that you are experiencing this uncomfortable rash and have been for so long! I have sent in two new medications to try: one is a cream, and one is a powder. I would like you to use each one time daily, one in the morning and one at night. You can choose which you prefer. I am also placing a referral to dermatology since you have been experiencing this for so long. ? ?You will NOT need to use the tolnaftate cream while being treated with these new prescriptions.  ? ?2. Pap smear: I will call you with these results. ? ?3. Tetanus booster: Unfortunately I cannot give this in clinic today because of insurance but you can get this at your pharmacy. ? ?Please follow up in 4 weeks so that we can check in on how your rash is doing, or sooner if needed. ? ?Remember: If you have any questions or concerns, please call our clinic at [phone] between 9am-5pm and after hours call [phone] and ask for the internal medicine resident on call. If you feel you are having a medical emergency please call 911. ? ?Champ Mungo, DO ? ?

## 2022-01-21 ENCOUNTER — Telehealth: Payer: Self-pay | Admitting: Internal Medicine

## 2022-01-21 LAB — CYTOLOGY - PAP
Comment: NEGATIVE
Diagnosis: NEGATIVE
High risk HPV: NEGATIVE

## 2022-01-21 NOTE — Progress Notes (Signed)
Internal Medicine Clinic Attending ? ?Case discussed with Dr. Dean  At the time of the visit.  We reviewed the resident?s history and exam and pertinent patient test results.  I agree with the assessment, diagnosis, and plan of care documented in the resident?s note.  ?

## 2022-01-21 NOTE — Telephone Encounter (Signed)
Spoke with patient to let her know that her pap smear was negative. ? ?Farrel Gordon, DO ?

## 2022-02-17 ENCOUNTER — Encounter: Payer: 59 | Admitting: Internal Medicine

## 2022-02-17 ENCOUNTER — Encounter: Payer: 59 | Admitting: Dietician

## 2022-02-25 ENCOUNTER — Other Ambulatory Visit: Payer: Self-pay | Admitting: *Deleted

## 2022-02-25 ENCOUNTER — Other Ambulatory Visit: Payer: Self-pay | Admitting: Neurology

## 2022-02-25 DIAGNOSIS — I10 Essential (primary) hypertension: Secondary | ICD-10-CM

## 2022-02-25 DIAGNOSIS — G20 Parkinson's disease: Secondary | ICD-10-CM

## 2022-02-26 ENCOUNTER — Ambulatory Visit (INDEPENDENT_AMBULATORY_CARE_PROVIDER_SITE_OTHER): Payer: 59 | Admitting: Dietician

## 2022-02-26 DIAGNOSIS — E119 Type 2 diabetes mellitus without complications: Secondary | ICD-10-CM

## 2022-02-26 MED ORDER — OLMESARTAN-AMLODIPINE-HCTZ 40-5-12.5 MG PO TABS: 1.0000 | ORAL_TABLET | Freq: Every day | ORAL | 2 refills | Status: DC

## 2022-02-26 NOTE — Patient Instructions (Addendum)
Goal is to walk 5 days a week for 15 minutes for the next 3 weeks.    Bring the walking record back with you to our next visit.   Lupita Leash [phone]

## 2022-02-26 NOTE — Progress Notes (Signed)
Medical Nutrition Therapy :  Appt start time: 1020 end time:  1115. Total time: 56 Visit # 1  Assessment:  Primary concerns today: wants to decrease her weight. Says she has been gaining weight since starting medicine for Parkinson's and this caused her diabetes. She does not want to use medicine ( semaglutidfe) to help her) and understanding it can help with weight loss. She is does not like  the side effects. Her sleep is very broken up and late. She watches TV most of the late night, then awakens to urinate every two hours and eats at these awakenings. Low threshold for sleep issues.  Preferred Learning Style:  No preference indicated  Learning Readiness: Contemplating  ANTHROPOMETRICS: Estimated body mass index is 41.3 kg/m (pended) as calculated from the following:   Height as of 01/19/22: _0  (1.676 m).   Weight as of this encounter: (P) 255 lb 14.4 oz (116.1 kg).  WEIGHT HISTORY: Weight was 218# and  steady prior to starting sinemet for parkinson's. She says it has increased her appetite and she has gaines >30# since then.  Wt Readings from Last 10 Encounters:  02/26/22 (P) 255 lb 14.4 oz (116.1 kg)  01/19/22 253 lb 12.8 oz (115.1 kg)  12/29/21 254 lb (115.2 kg)  11/03/21 249 lb 12.8 oz (113.3 kg)  09/01/21 245 lb 11.2 oz (111.4 kg)  08/11/21 236 lb (107 kg)  05/06/21 236 lb 9.6 oz (107.3 kg)  02/20/21 233 lb (105.7 kg)  08/27/20 219 lb (99.3 kg)  08/19/20 218 lb (98.9 kg)   SLEEP:very broken up 330-4 am to 2 Pm and awakens every 2 hours to urinate.   MEDICATIONS: sinemet BLOOD SUGAR:  Lab Results  Component Value Date   HGBA1C 6.5 (A) 12/29/2021   HGBA1C 6.4 (A) 09/01/2021   HGBA1C 6.2 (A) 05/06/2021   HGBA1C 5.6 12/08/2019     DIETARY INTAKE: Usual eating pattern includes 3 meals and 4 snacks per day. Everyday foods include fruit 1x/day, vegetableschips, processed foods.  Avoided foods include diet foods or anyhting that is lower in fat/calories or sugar,  Constipation: need to assess at future visit;  Dining Out (times/week): 4 24-hr recall:  B (330-4 AM): hot dog w/ mustard, 8 oz whole milk  Snk ( ~6 AM): cornbread Snk ( ~8 AM): vegetables leftover Snk ( ~10 AM and again ~noon  L ( PM): skippers for cheese burger w/ everything, medium fries and soda small D ( 7 PM): 2 bags of chips( medium size), [place] salad, 12 oz beer, 8 oz wine, 1/2 c mac & cheese Beverages: water, whole milk, wine, beer, regular soda  Usual physical activity:need to assess at future visit  Estimated daily energy needs for weight loss: 1600-1800 calories   Progress Towards Goal(s):  In progress.   Nutritional Diagnosis:  NB-1.1 Food and nutrition-related knowledge deficit As related to lack of previous training/education for nwly diangosed diabetes.  As evidenced by her report and elevated a1c .    Intervention:  Nutrition education about basic nutrition for diabetes and overall health and goal setting, . Action Goal: see patient instructions  Outcome goal:  Coordination of care:   Teaching Method Utilized: Visual, Auditory,Hands on Handouts given during visit include: After visit summary  Barriers to learning/adherence to lifestyle change: sleeping habits and waking up to urinate every two hours during sleep Demonstrated degree of understanding via:  Teach Back   Monitoring/Evaluation:  Dietary intake, exercise, and body weight in 3 week(s) Butch Penny  Devani Odonnel, RD 02/26/2022 4:47 PM. .

## 2022-03-10 ENCOUNTER — Encounter: Payer: Self-pay | Admitting: Adult Health

## 2022-03-10 ENCOUNTER — Telehealth: Payer: Self-pay | Admitting: Adult Health

## 2022-03-10 ENCOUNTER — Ambulatory Visit (INDEPENDENT_AMBULATORY_CARE_PROVIDER_SITE_OTHER): Payer: 59 | Admitting: Adult Health

## 2022-03-10 DIAGNOSIS — G20 Parkinson's disease: Secondary | ICD-10-CM

## 2022-03-10 MED ORDER — PRAMIPEXOLE DIHYDROCHLORIDE 0.5 MG PO TABS: 0.5000 mg | ORAL_TABLET | Freq: Three times a day (TID) | ORAL | 5 refills | Status: DC

## 2022-03-10 MED ORDER — CARBIDOPA-LEVODOPA 25-100 MG PO TABS: ORAL_TABLET | ORAL | 5 refills | Status: DC

## 2022-03-10 MED ORDER — CARBIDOPA-LEVODOPA 25-100 MG PO TABS
ORAL_TABLET | ORAL | 5 refills | Status: DC
Start: 2022-03-10 — End: 2022-07-13

## 2022-03-10 NOTE — Telephone Encounter (Signed)
Pt states Teresa Billow, NP told her that she would speak with MD re: possibly increasing the frequency of how she takes her carbidopa-levodopa (SINEMET IR) 25-100 MG tablet, pt states she was also told she should be able to pick up her medication later today.  Pt is being told by the pharmacy that they can not fill anything for her before the 16th of this month.  Pt states she does not want to be without this medication and experience shaking all the way until the 16th of June.  Pt asking if there is a way for the RN to contact pharmacy so she is not without this medication for any time

## 2022-03-10 NOTE — Patient Instructions (Addendum)
Your Plan:  We will continue your current daytime regimen of Sinemet taking 2 tablets at 6am, 2 tablets at 10 AM, 1 tablet at 2 PM and will add 1 tablet at 6pm.   Please avoid taking medication around meal times  Continue use of Mirapex 3 times daily  Try over-the-counter MiraLAX to help with constipation as well as increasing water intake and routine exercise     Follow-up in 4 months or call earlier if needed     Thank you for coming to see [place] at [facility]. I hope we have been able to provide you high quality care today.  You may receive a patient satisfaction survey over the next few weeks. We would appreciate your feedback and comments so that we may continue to improve ourselves and the health of our patients.

## 2022-03-10 NOTE — Telephone Encounter (Signed)
Contacted pt back, informed her Janett Billow did send it both mediations but she resent Sinemet sue to her increasing it. Advised to call pharmacy for verification before going back to make sure its ready for pick up. Pt understood and was appreciative.

## 2022-03-10 NOTE — Progress Notes (Signed)
[facility] [address] [place] [address] [phone]       OFFICE FOLLOW UP NOTE  [name] Date of Birth:  10/23/1965 Medical Record Number:  [id number]    Primary neurologist: Dr. Rexene Alberts Reason for visit: Parkinson's disease    SUBJECTIVE:   CHIEF COMPLAINT:  Chief Complaint  Patient presents with   Follow-up    Rm 2 alone Pt is well, states she takes her meds at 6am/10am/2pm and by around 6pm it seems to wore off and she has tremors more.    HPI:   Update 03/10/2022 JM: Patient returns for follow-up visit after prior initial consult visit with Dr. Rexene Alberts 4 months ago.  She is unaccompanied.  She has been stable since prior visit.  Currently taking Sinemet 25/100 2 pills 6am, 2 pills 10am and 1 pill 2pm as well as pramipexole 0.5 mg 3 times daily.  Tolerates medications well without side effects.  Reports around 6 PM, she experiences worsening in her tremors.  She typically goes to bed around midnight.  She does note worsening of her tremors prior to her next dose. She denies taking pills with meals or around meal time. Is requesting Sinemet dosage be increased.  Does continue to have occasional constipation, not currently using any type of laxative.  She does admit to continued limited water intake.  She does have concerns regarding weight gain.  Gait has been stable, does not use assistive device, denies any recent falls.  No further concerns at this time.    History provided for reference purposes only Consult visit 11/03/2021 Dr. Rexene Alberts: [name] is a 57 year old right-handed woman with an underlying medical history of hypertension, kidney stones, prediabetes, and obesity, who was previously diagnosed with Parkinson's disease.  She had follow-up with Dr. Wells Guiles until May 2022, and started seeing Dr. Carles Collet in 04/21.  Prior to that, patient was followed by Dr. Jeni Salles, [place] where patient previously  lived.  Patient reports symptoms dating back to 2013 when she first started having of right foot tremor.  She had a DaTscan in or around 2014 which supported Parkinson's disease.  She was lost to follow-up until 2018 or 19 and started treatment with levodopa with her previous neurologist.  She feels that the medication has been helpful.  She currently takes levodopa 2 pills in the morning, 2 pills midday and 1 pill in the afternoon along with pramipexole 0.5 mg 3 times daily.  I reviewed Dr. Doristine Devoid office notes from 02/20/2021, as well as 01/15/2020.  She has had some weight gain.  She was advised to continue with generic Sinemet 25-100 mg strength as well as pramipexole 0.5 mg strength 1 pill 3 times daily.  She has been on pramipexole since November 2021. She has had intermittent constipation, has a bowel movement every other day approximately.  She does not always hydrate well and estimates that she drinks about 1 bottle of water per day on average.  She denies a family history of Parkinson's disease.  She has had 1 fall in 2022.  She reports that she fell in the bathroom at her daughter's house in [place].  She does not typically use a cane or walker.  She has had therapy.  She drinks alcohol daily in the form of 1 glass of wine or 1 beer.  She does not drink caffeine daily.  She does not have any records from her neurologist in [place].  She does not  know where they are.  A DaTscan report was not available for my review today but some records were not available apparently when patient saw Dr. Carles Collet initially.      ROS:   14 system review of systems performed and negative with exception of those listed in HPI  PMH:  Past Medical History:  Diagnosis Date   Breast cancer screening    Encounter for hepatitis C screening test for low risk patient 08/28/2020   Encounter for screening for HIV 08/28/2020   HTN (hypertension)    Kidney stones    Need for shingles vaccine 12/30/2021   Parkinson's disease ([place])     Screening for colorectal cancer    Tremors of nervous system     PSH:  Past Surgical History:  Procedure Laterality Date   TONSILLECTOMY      Social History:  Social History   Socioeconomic History   Marital status: Single    Spouse name: Not on file   Number of children: Not on file   Years of education: Not on file   Highest education level: Not on file  Occupational History   Not on file  Tobacco Use   Smoking status: Never    Passive exposure: Yes   Smokeless tobacco: Never  Vaping Use   Vaping Use: Never used  Substance and Sexual Activity   Alcohol use: Yes    Alcohol/week: 2.0 standard drinks of alcohol    Types: 1 Glasses of wine, 1 Cans of beer per week    Comment: 1 drink per day, either wine or beer   Drug use: Never   Sexual activity: Yes  Other Topics Concern   Not on file  Social History Narrative   Right hand   One story home    Lives with domestic partner   Caffeine none .    Retired Marine scientist (traveler).  Retired 2019, disabled.     Social Determinants of Health   Financial Resource Strain: Not on file  Food Insecurity: Not on file  Transportation Needs: Not on file  Physical Activity: Not on file  Stress: Not on file  Social Connections: Not on file  Intimate Partner Violence: Not on file    Family History:  Family History  Problem Relation Age of Onset   Diabetes type II Mother    Alzheimer's disease Mother    Diabetes type II Father    Kidney disease Father    Healthy Son    Healthy Son    Healthy Son    Healthy Son     Medications:   Current Outpatient Medications on File Prior to Visit  Medication Sig Dispense Refill   aspirin EC 81 MG tablet Take 1 tablet (81 mg total) by mouth daily. 90 tablet 3   Blood Glucose Monitoring Suppl (ONE TOUCH ULTRA MINI) w/Device KIT 1 kit by Does not apply route daily as needed (For hypergycemia, signs can be increased urination, increased thirst, or nausea.). 1 kit 0   carbidopa-levodopa  (SINEMET IR) 25-100 MG tablet 2 at 8am/2 at noon/1 at 4pm 150 tablet 5   glucose blood test strip Use as instructed 100 each 12   ibuprofen (ADVIL,MOTRIN) 600 MG tablet Take 1 tablet (600 mg total) by mouth every 6 (six) hours as needed. 30 tablet 0   Olmesartan-amLODIPine-HCTZ 40-5-12.5 MG TABS Take 1 tablet by mouth daily. 90 tablet 2   OneTouch Delica Lancets 30Z MISC 1 each by Does not apply route daily as needed (for  hyperglycemia). 100 each 12   pramipexole (MIRAPEX) 0.5 MG tablet Take 1 tablet (0.5 mg total) by mouth 3 (three) times daily. 90 tablet 5   rosuvastatin (CRESTOR) 10 MG tablet TAKE 1 TABLET(10 MG) BY MOUTH DAILY 90 tablet 3   No current facility-administered medications on file prior to visit.    Allergies:  No Known Allergies    OBJECTIVE:  Physical Exam  Vitals:   03/10/22 1427  BP: 133/83  Pulse: 90  Weight: 253 lb (114.8 kg)  Height: 5' 6"  (1.676 m)   Body mass index is 40.84 kg/m. No results found.   General: Morbidly obese very pleasant middle-aged African-American female, seated, in no evident distress Head: head normocephalic and atraumatic.   Neck: supple with no carotid or supraclavicular bruits Cardiovascular: regular rate and rhythm, no murmurs Musculoskeletal: no deformity Skin:  no rash/petichiae Vascular:  Normal pulses all extremities   Neurologic Exam Mental Status: Awake and fully alert.  Fluent speech and language.  Oriented to place and time. Recent and remote memory intact. Attention span, concentration and fund of knowledge appropriate. Mood and affect appropriate.  Cranial Nerves: Pupils equal, briskly reactive to light. Extraocular movements full without nystagmus. Visual fields full to confrontation. Hearing intact. Facial sensation intact. Face, tongue, palate moves normally and symmetrically.  Motor: Normal strength in all tested extremity muscles.  Slightly increased tone RUE. Mild intermittent R>L resting tremor.  Intermittent  right foot tremor.  Unable to appreciate intention tremor Sensory.: intact to touch , pinprick , position and vibratory sensation.  Coordination: Rapid alternating movements good in upper extremities, slightly decreased foot taps bilaterally. Finger-to-nose and heel-to-shin performed accurately bilaterally. Gait and Station: Arises from chair without difficulty. Stance is normal. Gait demonstrates normal stride length and balance without use of AD.  No evidence of shuffling or freezing.  Mild tremor in both upper extremities when walking. Reflexes: 1+ and symmetric. Toes downgoing.         ASSESSMENT/PLAN: Teresa Logan is a 56 y.o. year old female   Right-sided predominant Parkinson's disease -Increase Sinemet 25/100 dosage - continue 2 tab 6am, 2 tab 10am, 1 tab 2pm and 1 tab 6pm.  She was reminded to avoid taking medication around mealtime -Discussed potential side effects for her to monitor and call with any concerns -Continue Mirapex 0.5 mg 3 times daily -refill provided -Discussed use of over-the-counter laxatives such as MiraLAX to help with constipation as well as ensuring adequate water intake and routine exercise     Follow up in 4 months or call earlier if needed    CC:  PCP: Madalyn Rob, MD    I spent 34 minutes of face-to-face and non-face-to-face time with patient.  This included previsit chart review, lab review, study review, order entry, electronic health record documentation, patient education regarding diagnosis of sleep apnea with review and discussion of compliance report and answered all other questions to patient's satisfaction   Frann Rider, [facility]  [facility] [address] [place], [place] [address]  Phone [phone] Fax [phone] Note: This document was prepared with digital dictation and possible smart phrase technology. Any transcriptional errors that result from this process are  unintentional.

## 2022-03-19 ENCOUNTER — Encounter: Payer: 59 | Admitting: Dietician

## 2022-03-25 ENCOUNTER — Encounter: Payer: Self-pay | Admitting: Dietician

## 2022-03-25 ENCOUNTER — Ambulatory Visit (INDEPENDENT_AMBULATORY_CARE_PROVIDER_SITE_OTHER): Payer: 59 | Admitting: Dietician

## 2022-03-25 DIAGNOSIS — E119 Type 2 diabetes mellitus without complications: Secondary | ICD-10-CM | POA: Diagnosis not present

## 2022-03-25 NOTE — Patient Instructions (Addendum)
From the Bodyweight Planner at :  [email or web address].com/    In order to maintain your current weight, you should eat:: 2,643 Calories/day To reach your goal of 200 lbs in 274 days, you should eat: 1,527 Calories/day To maintain your goal of 200 lbs, you should eat:  2,286  Calories/day   The "reach your goal" and "maintain your goal" calories displayed assumes you will continue the physical activity changes you previously entered in step 3.  Do your results seem too high? People often underestimate how much they eat and the Body Weight Planner values are accurate for most people. If your metabolism is abnormally low or you are very sedentary then Body Weight Planner values will be too high. Let's try a Measure on MONDAYs.  Which means writing down what you eat and how much of it you eat and then figuring out the calories that are in it one day a week.   This can help you decide where to shave calories, trade foods, what is underlying your eating habits.    Lupita Leash [phone]

## 2022-03-25 NOTE — Progress Notes (Signed)
   Medical Nutrition Therapy :  Appt start time: 227 end time:  307 Total time: 40 Visit # 2  Assessment:  Primary concerns today: wants to decrease her weight. . [name] sates today that she is dissappointed in her lack of weight loss and that she has been thinking about her sleep issues and calories form her beer intake. She states she realizes she has to make changes for her weight to change and to help her diabetes. She did walk 3 days a week for 15 minutes and states this is not different form what she was doing before she met with me and started her weight loss journey. After showing her the [employer] bodyweight planner. she states she realizes she needs to reduce hr calorie intake and to do this she needs to keep records and learn where her calories are coming from.   Low threshold for sleep issues.    ANTHROPOMETRICS: Estimated body mass index is 41.24 kg/m as calculated from the following:   Height as of 03/10/22: _0  (1.676 m).   Weight as of this encounter: 255 lb 8 oz (115.9 kg).  WEIGHT HISTORY:  Wt Readings from Last 10 Encounters:  03/25/22 255 lb 8 oz (115.9 kg)  03/10/22 253 lb (114.8 kg)  02/26/22 (P) 255 lb 14.4 oz (116.1 kg)  01/19/22 253 lb 12.8 oz (115.1 kg)  12/29/21 254 lb (115.2 kg)  11/03/21 249 lb 12.8 oz (113.3 kg)  09/01/21 245 lb 11.2 oz (111.4 kg)  08/11/21 236 lb (107 kg)  05/06/21 236 lb 9.6 oz (107.3 kg)  02/20/21 233 lb (105.7 kg)    BLOOD SUGAR:  Lab Results  Component Value Date   HGBA1C 6.5 (A) 12/29/2021   HGBA1C 6.4 (A) 09/01/2021   HGBA1C 6.2 (A) 05/06/2021   HGBA1C 5.6 12/08/2019      Estimated daily energy needs for weight loss: To reach your goal of 200 lbs in 274 days, you should eat: 1,527 Calories/day   Progress Towards Goal(s):  In progress.   Nutritional Diagnosis:  NB-1.1 Food and nutrition-related knowledge deficit As related to lack of previous training/education for newly diangosed diabetes.  As evidenced by her  report and elevated a1c .    Intervention:  Nutrition education about basic nutrition for diabetes and overall health and goal setting, . Action Goal: see patient instructions  Outcome goal: improved knowledge about diabetes  Coordination of care: none at this time  Teaching Method Utilized: Visual, Auditory,Hands on Handouts given during visit include: After visit summary  Barriers to learning/adherence to lifestyle change: sleeping habits and waking up to urinate every two hours during sleep Demonstrated degree of understanding via:  Teach Back   Monitoring/Evaluation:  Dietary intake, exercise, and body weight in 6 week(s) Debera Lat, RD 03/25/2022 4:49 PM. .

## 2022-04-15 ENCOUNTER — Telehealth: Payer: Self-pay | Admitting: Neurology

## 2022-04-15 NOTE — Telephone Encounter (Signed)
Pt called and said she needs a medication adjustment and would like for a nurse to give her a call.

## 2022-04-16 NOTE — Telephone Encounter (Signed)
Called patient, LVM informing her Teresa Logan is out of office today, returns Mon. Requested she call back and let me know if she will wait till Mon or prefers message be sent to work in MD.

## 2022-04-17 NOTE — Telephone Encounter (Signed)
Pt called back and said she would wait until Monday when Shanda Bumps returns.

## 2022-04-20 NOTE — Telephone Encounter (Signed)
Please contact patient regarding Dr. Teofilo Pod recommendations. Thank you!

## 2022-04-20 NOTE — Telephone Encounter (Signed)
Called patient and reviewed Dr Teofilo Pod carb/levo and pramipexole direction with her. She repeated correctly. She will call us to let us know how she is doing in event new Rx needs to be sent in. Patient verbalized understanding, appreciation.

## 2022-04-20 NOTE — Telephone Encounter (Signed)
Called patient who stated it's 4 hours in between doses of carb/levo but she still has tremors at 10 pm, feels she's not being covered. She feels she doesn't need 2 tabs at 6 am, 10 am because she is sometimes still in bed and would rather change to one tab at 1st 2 doses and 2 tabs at last 2 doses daily.   She then stated she understood she could take pramipexole 4 x a day like she takes carb/levo. I advised will send this to NP and call her back. Patient verbalized understanding, appreciation.

## 2022-04-20 NOTE — Telephone Encounter (Signed)
I recommend changing her Sinemet to 1-1/2 pills 5 times a day at 6 AM, 10 AM, 2 PM, 6 PM and 10 PM daily. Continue with pramipexole 1 pill 3 times a day at 6 AM, 2 PM, and 6 PM daily.

## 2022-04-20 NOTE — Telephone Encounter (Signed)
At recent visit, she was taking Sinemet 25/100 2 pills 6am, 2 pills 10am and 1 pill 2pm as well as pramipexole 0.5 mg 3 times daily at those same times. Adjusted Sinemet dosage at prior visit with adding an additional 1 pill at 6 PM as she was complaining of worsening tremors around 6 PM and continued on same dose of pramipexole.  She is now experiencing tremors around 10 PM.    Dr. Frances Furbish, would you recommend further increasing either Sinemet or pramipexole or maybe adjusting times that she takes her medications? Thank you!

## 2022-04-23 ENCOUNTER — Telehealth: Payer: Self-pay | Admitting: Adult Health

## 2022-04-23 DIAGNOSIS — G20 Parkinson's disease: Secondary | ICD-10-CM

## 2022-04-23 MED ORDER — PRAMIPEXOLE DIHYDROCHLORIDE 0.5 MG PO TABS: 0.5000 mg | ORAL_TABLET | Freq: Three times a day (TID) | ORAL | 5 refills | Status: DC

## 2022-04-23 NOTE — Telephone Encounter (Signed)
Pt request refill for pramipexole (MIRAPEX) 0.5 MG tablet at [facility] #[id number]

## 2022-04-23 NOTE — Addendum Note (Signed)
Addended by: Colen Darling C on: 04/23/2022 11:07 AM   Modules accepted: Orders

## 2022-04-23 NOTE — Telephone Encounter (Signed)
LVM informing patient that NP refilled pramipexole in June for 6 months. She should have refills on file. Left # for questions.

## 2022-04-23 NOTE — Telephone Encounter (Signed)
Pt  made aware of response from Pincus Sanes, RN @ 9:50.  Pharmacy informed pt they did not get the 6 mo refill back in June,  Pt states pharmacy is sending a refill request as a result of no getting 6 mo request from June

## 2022-04-23 NOTE — Telephone Encounter (Signed)
Pramipexole reordered as per previous Rx from June.

## 2022-05-13 ENCOUNTER — Encounter: Payer: 59 | Admitting: Dietician

## 2022-05-14 ENCOUNTER — Telehealth: Payer: Self-pay | Admitting: Adult Health

## 2022-05-14 NOTE — Telephone Encounter (Signed)
Contacted pt back, She stated she is taking medications in a way that works for her and wanted provider to be aware and correct the Rx directions.  She stated she is taking -Sinemet 2 tab 2 PM, 6 PM and 10 PM  Sinemet 3 tab 2am, 6 am, 10 am. Pramipexole 1 tab 3 times a day at 2 PM, 6 PM 10pm

## 2022-05-14 NOTE — Telephone Encounter (Signed)
Pt is asking for a call to discuss the times that she takes the carbidopa-levodopa (SINEMET IR) 25-100 MG tablet  & pramipexole (MIRAPEX) 0.5 MG tablet

## 2022-05-14 NOTE — Telephone Encounter (Signed)
I am not quite sure how we went from Sinemet 1-1/2 pills 5 times a day to 2 pills 3 times a day + 3 pills 3 times a day for total of 15 pills/day, which is a significant increase and too much Sinemet.  I am not sure who is prescribing this for her at this dose.  She is strongly advised to go back to the regimen I had suggested before which is 1-1/2 pills 5 times a day of the carbidopa-levodopa.  Please reiterate and send patient information in writing on MyChart if need be.  Please get verbal feedback from her to revert back to taking her Sinemet 1-1/2 pills 5 times a day for now and she can continue with pramipexole 0.5 mg strength 1 pill 3 times daily.

## 2022-05-14 NOTE — Telephone Encounter (Signed)
Per Dr. Teofilo Pod recommendations on 7/19 via telephone  "I recommend changing her Sinemet to 1-1/2 pills 5 times a day at 6 AM, 10 AM, 2 PM, 6 PM and 10 PM daily. Continue with pramipexole 1 pill 3 times a day at 6 AM, 2 PM, and 6 PM daily."  At this point, she is taking way too much of Sinemet (currently taking 15 pills per day). Maybe changing night time dose to extended release so she doesn't feel like she needs to be taking a dose at 2 AM? Will forward to Dr. Frances Furbish for further input and further recommendations on dosages

## 2022-05-14 NOTE — Telephone Encounter (Signed)
Contacted pt back, strongly advised her to revert back to  1-1/2 pills 5 times a day of the carbidopa-levodopa.  pramipexole 0.5 mg strength 1 pill 3 times daily. Reiterated to take Sinemet to 1-1/2 pills 5 times a day at 6 AM, 10 AM, 2 PM, 6 PM and 10 PM daily. Continue with pramipexole 1 pill 3 times a day at 6 AM, 2 PM, and 6 PM daily. Pt wasn't in agreement with that, stated she is the patient and should do what she wants and feel it is best. Encouraged her it is not safe to take more than medical profession recommendations. We can only advise her to take as prescribed and encouraged not to go against that. She verbally understood my instructions. I will also send to [facility] for reference.

## 2022-05-18 ENCOUNTER — Encounter: Payer: 59 | Admitting: Dietician

## 2022-06-02 ENCOUNTER — Encounter: Payer: 59 | Admitting: Dietician

## 2022-06-09 ENCOUNTER — Encounter: Payer: 59 | Admitting: Dietician

## 2022-06-23 ENCOUNTER — Telehealth: Payer: Self-pay

## 2022-06-23 NOTE — Telephone Encounter (Signed)
error 

## 2022-06-25 ENCOUNTER — Telehealth: Payer: Self-pay

## 2022-06-25 NOTE — Telephone Encounter (Signed)
Fluconazole

## 2022-07-06 NOTE — Progress Notes (Deleted)
[facility] [address] [place]. [place] [address] [phone]       OFFICE FOLLOW UP NOTE  Teresa Logan Date of Birth:  [birth year] Medical Record Number:  [id number]    Primary neurologist: Dr. Rexene Logan Reason for visit: Parkinson's disease    SUBJECTIVE:   CHIEF COMPLAINT:  No chief complaint on file.   HPI:   Update 07/07/2022 JM: Patient is being seen for 50-monthfollow-up regarding Parkinson's disease.  After prior visit, Dr. ARexene Albertsrecommended changing Sinemet dosage to 1.5 pills 5 times daily (6am, 10am, 2pm, 6pm and 10pm) and continue pramipexole 1 pills TID.         History provided for reference purposes only Update 03/10/2022 JM: Patient returns for follow-up visit after prior initial consult visit with Dr. ARexene Alberts4 months ago.  She is unaccompanied.  She has been stable since prior visit.  Currently taking Sinemet 25/100 2 pills 6am, 2 pills 10am and 1 pill 2pm as well as pramipexole 0.5 mg 3 times daily.  Tolerates medications well without side effects.  Reports around 6 PM, she experiences worsening in her tremors.  She typically goes to bed around midnight.  She does note worsening of her tremors prior to her next dose. She denies taking pills with meals or around meal time. Is requesting Sinemet dosage be increased.  Does continue to have occasional constipation, not currently using any type of laxative.  She does admit to continued limited water intake.  She does have concerns regarding weight gain.  Gait has been stable, does not use assistive device, denies any recent falls.  No further concerns at this time.  Consult visit 11/03/2021 Dr. ARexene Alberts Ms. Teresa KimbleyMCaprice Beaveris a 55year old right-handed woman with an underlying medical history of hypertension, kidney stones, prediabetes, and obesity, who was previously diagnosed with Parkinson's disease.  She had follow-up with Dr. RWells Guilesuntil May 2022, and started seeing Dr. TCarles Collet in 04/21.  Prior to that, patient [place]  Patient reports symptoms dating back to 2013 when she first started having of right foot tremor.  She had a DaTscan in or around 2014 which supported Parkinson's disease.  She was lost to follow-up until 2018 or 19 and started treatment with levodopa with her previous neurologist.  She feels that the medication has been helpful.  She currently takes levodopa 2 pills in the morning, 2 pills midday and 1 pill in the afternoon along with pramipexole 0.5 mg 3 times daily.  I reviewed Dr. TDoristine Devoidoffice notes from 02/20/2021, as well as 01/15/2020.  She has had some weight gain.  She was advised to continue with generic Sinemet 25-100 mg strength as well as pramipexole 0.5 mg strength 1 pill 3 times daily.  She has been on pramipexole since November 2021. She has had intermittent constipation, has a bowel movement every other day approximately.  She does not always hydrate well and estimates that she drinks about 1 bottle of water per day on average.  She denies a family history of Parkinson's disease.  She has had 1 fall in 2022.  She reports that she [place]  She does not typically use a cane or walker.  She has had therapy.  She drinks alcohol daily in the form of 1 glass of wine or 1 beer.  She does not drink caffeine daily.  She does not  have any records from her neurologist in [place].  She does not know where they are.  A DaTscan report was not available for my review today but some records were not available apparently when patient saw Dr. Carles Collet initially.      ROS:   14 system review of systems performed and negative with exception of those listed in HPI  PMH:  Past Medical History:  Diagnosis Date   Breast cancer screening    Encounter for hepatitis C screening test for low risk patient 08/28/2020   Encounter for screening for HIV 08/28/2020    HTN (hypertension)    Kidney stones    Need for shingles vaccine 12/30/2021   Parkinson's disease ([place])    Screening for colorectal cancer    Tremors of nervous system     PSH:  Past Surgical History:  Procedure Laterality Date   TONSILLECTOMY      Social History:  Social History   Socioeconomic History   Marital status: Single    Spouse name: Not on file   Number of children: Not on file   Years of education: Not on file   Highest education level: Not on file  Occupational History   Not on file  Tobacco Use   Smoking status: Never    Passive exposure: Yes   Smokeless tobacco: Never  Vaping Use   Vaping Use: Never used  Substance and Sexual Activity   Alcohol use: Yes    Alcohol/week: 2.0 standard drinks of alcohol    Types: 1 Glasses of wine, 1 Cans of beer per week    Comment: 1 drink per day, either wine or beer   Drug use: Never   Sexual activity: Yes  Other Topics Concern   Not on file  Social History Narrative   Right hand   One story home    Lives with domestic partner   Caffeine none .    Retired Marine scientist (traveler).  Retired 2019, disabled.     Social Determinants of Health   Financial Resource Strain: Not on file  Food Insecurity: Not on file  Transportation Needs: Not on file  Physical Activity: Not on file  Stress: Not on file  Social Connections: Not on file  Intimate Partner Violence: Not on file    Family History:  Family History  Problem Relation Age of Onset   Diabetes type II Mother    Alzheimer's disease Mother    Diabetes type II Father    Kidney disease Father    Healthy Son    Healthy Son    Healthy Son    Healthy Son     Medications:   Current Outpatient Medications on File Prior to Visit  Medication Sig Dispense Refill   aspirin EC 81 MG tablet Take 1 tablet (81 mg total) by mouth daily. 90 tablet 3   Blood Glucose Monitoring Suppl (ONE TOUCH ULTRA MINI) w/Device KIT 1 kit by Does not apply route daily as needed (For  hypergycemia, signs can be increased urination, increased thirst, or nausea.). 1 kit 0   carbidopa-levodopa (SINEMET IR) 25-100 MG tablet 2 at 6am/2 at 10am/1 at 2pm/1 at 6pm 180 tablet 5   glucose blood test strip Use as instructed 100 each 12   ibuprofen (ADVIL,MOTRIN) 600 MG tablet Take 1 tablet (600 mg total) by mouth every 6 (six) hours as needed. 30 tablet 0   Olmesartan-amLODIPine-HCTZ 40-5-12.5 MG TABS Take 1 tablet by mouth daily. 90 tablet 2   OneTouch Delica  Lancets 33G MISC 1 each by Does not apply route daily as needed (for hyperglycemia). 100 each 12   pramipexole (MIRAPEX) 0.5 MG tablet Take 1 tablet (0.5 mg total) by mouth 3 (three) times daily. 90 tablet 5   rosuvastatin (CRESTOR) 10 MG tablet TAKE 1 TABLET(10 MG) BY MOUTH DAILY 90 tablet 3   No current facility-administered medications on file prior to visit.    Allergies:  No Known Allergies    OBJECTIVE:  Physical Exam  There were no vitals filed for this visit.  There is no height or weight on file to calculate BMI. No results found.   General: Morbidly obese very pleasant middle-aged African-American female, seated, in no evident distress Head: head normocephalic and atraumatic.   Neck: supple with no carotid or supraclavicular bruits Cardiovascular: regular rate and rhythm, no murmurs Musculoskeletal: no deformity Skin:  no rash/petichiae Vascular:  Normal pulses all extremities   Neurologic Exam Mental Status: Awake and fully alert.  Fluent speech and language.  Oriented to place and time. Recent and remote memory intact. Attention span, concentration and fund of knowledge appropriate. Mood and affect appropriate.  Cranial Nerves: Pupils equal, briskly reactive to light. Extraocular movements full without nystagmus. Visual fields full to confrontation. Hearing intact. Facial sensation intact. Face, tongue, palate moves normally and symmetrically.  Motor: Normal strength in all tested extremity muscles.   Slightly increased tone RUE. Mild intermittent R>L resting tremor.  Intermittent right foot tremor.  Unable to appreciate intention tremor Sensory.: intact to touch , pinprick , position and vibratory sensation.  Coordination: Rapid alternating movements good in upper extremities, slightly decreased foot taps bilaterally. Finger-to-nose and heel-to-shin performed accurately bilaterally. Gait and Station: Arises from chair without difficulty. Stance is normal. Gait demonstrates normal stride length and balance without use of AD.  No evidence of shuffling or freezing.  Mild tremor in both upper extremities when walking. Reflexes: 1+ and symmetric. Toes downgoing.         ASSESSMENT/PLAN: Shavontae Gibeault is a 56 y.o. year old female   Right-sided predominant Parkinson's disease -Increase Sinemet 25/100 dosage - continue 2 tab 6am, 2 tab 10am, 1 tab 2pm and 1 tab 6pm.  She was reminded to avoid taking medication around mealtime -Discussed potential side effects for her to monitor and call with any concerns -Continue Mirapex 0.5 mg 3 times daily -refill provided -Discussed use of over-the-counter laxatives such as MiraLAX to help with constipation as well as ensuring adequate water intake and routine exercise     Follow up in 4 months or call earlier if needed    CC:  PCP: Serita Butcher, MD    I spent 34 minutes of face-to-face and non-face-to-face time with patient.  This included previsit chart review, lab review, study review, order entry, electronic health record documentation, patient education regarding diagnosis of sleep apnea with review and discussion of compliance report and answered all other questions to patient's satisfaction   Frann Rider, [facility]  [facility] [address] [place], [place] [address]  Phone [phone] Fax [phone] Note: This document was prepared with digital dictation and possible smart phrase  technology. Any transcriptional errors that result from this process are unintentional.

## 2022-07-07 ENCOUNTER — Ambulatory Visit: Payer: 59 | Admitting: Adult Health

## 2022-07-09 NOTE — Progress Notes (Signed)
[facility] [address] [place]. [place] [address] [phone]       OFFICE FOLLOW UP NOTE  [name] Date of Birth:  Mar 28, 1966 Medical Record Number:  [id number]    Primary neurologist: Dr. Rexene Alberts Reason for visit: Parkinson's disease    SUBJECTIVE:   CHIEF COMPLAINT:  Chief Complaint  Patient presents with   follow up parkinsons    She states feeling okay. No questions or concerns. Room 2 alone    HPI:   Update 07/13/2022 JM: Patient is being seen for 76-monthfollow-up regarding Parkinson's disease unaccompanied.  After prior visit, Dr. ARexene Albertsrecommended changing Sinemet dosage to 1.5 pills 5 times daily (6am, 10am, 2pm, 6pm and 10pm) and continue pramipexole 1 pills TID.  She reports currently taking Sinemet 1.5 mg tablets at 11a, 4p, 9p, 2a and 6a.  Reports her sleeping pattern is erratic, will not take up specifically at 2 AM to take medication but if she is up at that time, she will take it.  Currently taking pramipexole at 11a, 7p and 3a.  Reports having good days and bad days, overall stable since prior visit.  Reports gait has been stable, denies any recent falls.    History provided for reference purposes only Update 03/10/2022 JM: Patient returns for follow-up visit after prior initial consult visit with Dr. ARexene Alberts4 months ago.  She is unaccompanied.  She has been stable since prior visit.  Currently taking Sinemet 25/100 2 pills 6am, 2 pills 10am and 1 pill 2pm as well as pramipexole 0.5 mg 3 times daily.  Tolerates medications well without side effects.  Reports around 6 PM, she experiences worsening in her tremors.  She typically goes to bed around midnight.  She does note worsening of her tremors prior to her next dose. She denies taking pills with meals or around meal time. Is requesting Sinemet dosage be increased.  Does continue to have occasional constipation, not currently using any type of laxative.  She does admit to  continued limited water intake.  She does have concerns regarding weight gain.  Gait has been stable, does not use assistive device, denies any recent falls.  No further concerns at this time.  Consult visit 11/03/2021 Dr. ARexene Alberts Ms. [name] Logan 56year old right-handed woman with an underlying medical history of hypertension, kidney stones, prediabetes, and obesity, who was previously diagnosed with Parkinson's disease.  She had follow-up with Dr. RWells Guilesuntil May 2022, and started seeing Dr. TCarles Colletin 04/21.  Prior to that, patient [place]  Patient reports symptoms dating back to 2013 when she first started having of right foot tremor.  She had [place] DaTscan in or around 2014 which supported Parkinson's disease.  She was lost to follow-up until 2018 or 19 and started treatment with levodopa with her previous neurologist.  She feels that the medication has been helpful.  She currently takes levodopa 2 pills in the morning, 2 pills midday and 1 pill in the afternoon along with pramipexole 0.5 mg 3 times daily.  I reviewed Dr. TDoristine Devoidoffice notes from 02/20/2021, as well as 01/15/2020.  She has had some weight gain.  She was advised to continue with generic Sinemet 25-100 mg strength as well as pramipexole 0.5 mg strength 1 pill 3 times daily.  She has been on pramipexole since November 2021. She has had intermittent constipation, has Logan bowel movement every other day approximately.  She does not always hydrate well and estimates that she drinks about 1 bottle of water per day on average.  She denies Logan family history of Parkinson's disease.  She has had 1 fall in 2022.  She reports that she fell in the bathroom at her daughter's house in [place].  She does not typically use Logan cane or walker.  She has had therapy.  She drinks alcohol daily in the form of 1 glass of wine or 1 beer.  She does not drink caffeine daily.  She does not have  any records from her neurologist in [place].  She does not know where they are.  Logan DaTscan report was not available for my review today but some records were not available apparently when patient saw Dr. Carles Collet initially.      ROS:   14 system review of systems performed and negative with exception of those listed in HPI  PMH:  Past Medical History:  Diagnosis Date   Breast cancer screening    Encounter for hepatitis Teresa screening test for low risk patient 08/28/2020   Encounter for screening for HIV 08/28/2020   HTN (hypertension)    Kidney stones    Need for shingles vaccine 12/30/2021   Parkinson's disease ([place]resa Logan)    Screening for colorectal cancer    Tremors of nervous system     PSH:  Past Surgical History:  Procedure Laterality Date   TONSILLECTOMY      Social History:  Social History   Socioeconomic History   Marital status: Single    Spouse name: Not on file   Number of children: Not on file   Years of education: Not on file   Highest education level: Not on file  Occupational History   Not on file  Tobacco Use   Smoking status: Never    Passive exposure: Yes   Smokeless tobacco: Never  Vaping Use   Vaping Use: Never used  Substance and Sexual Activity   Alcohol use: Yes    Alcohol/week: 2.0 standard drinks of alcohol    Types: 1 Glasses of wine, 1 Cans of beer per week    Comment: 1 drink per day, either wine or beer   Drug use: Never   Sexual activity: Yes  Other Topics Concern   Not on file  Social History Narrative   Right hand   One story home    Lives with domestic partner   Caffeine none .    Retired Marine scientist (traveler).  Retired 2019, disabled.     Social Determinants of Health   Financial Resource Strain: Not on file  Food Insecurity: Not on file  Transportation Needs: Not on file  Physical Activity: Not on file  Stress: Not on file  Social Connections: Not on file  Intimate Partner Violence: Not on file    Family History:  Family History   Problem Relation Age of Onset   Diabetes type II Mother    Alzheimer's disease Mother    Diabetes type II Father    Kidney disease Father    Healthy Son    Healthy Son    Healthy Son    Healthy Son     Medications:   Current Outpatient Medications on File Prior to Visit  Medication Sig Dispense Refill   aspirin EC 81 MG tablet Take 1 tablet (81 mg total) by mouth daily. 90 tablet 3   Blood Glucose Monitoring Suppl (ONE TOUCH ULTRA MINI) w/Device KIT 1 kit by Does not  apply route daily as needed (For hypergycemia, signs can be increased urination, increased thirst, or nausea.). 1 kit 0   carbidopa-levodopa (SINEMET IR) 25-100 MG tablet 2 at 6am/2 at 10am/1 at 2pm/1 at 6pm 180 tablet 5   glucose blood test strip Use as instructed 100 each 12   ibuprofen (ADVIL,MOTRIN) 600 MG tablet Take 1 tablet (600 mg total) by mouth every 6 (six) hours as needed. 30 tablet 0   Olmesartan-amLODIPine-HCTZ 40-5-12.5 MG TABS Take 1 tablet by mouth daily. 90 tablet 2   OneTouch Delica Lancets 62B MISC 1 each by Does not apply route daily as needed (for hyperglycemia). 100 each 12   pramipexole (MIRAPEX) 0.5 MG tablet Take 1 tablet (0.5 mg total) by mouth 3 (three) times daily. 90 tablet 5   rosuvastatin (CRESTOR) 10 MG tablet TAKE 1 TABLET(10 MG) BY MOUTH DAILY 90 tablet 3   No current facility-administered medications on file prior to visit.    Allergies:  No Known Allergies    OBJECTIVE:  Physical Exam  Vitals:   07/13/22 1445  BP: 129/84  Pulse: 88  Weight: 248 lb 8 oz (112.7 kg)  Height: 5' 6"  (1.676 m)   Body mass index is 40.11 kg/m. No results found.   General: Morbidly obese very pleasant middle-aged African-American female, seated, in no evident distress Head: head normocephalic and atraumatic.   Neck: supple with no carotid or supraclavicular bruits Cardiovascular: regular rate and rhythm, no murmurs Musculoskeletal: no deformity Skin:  no rash/petichiae Vascular:  Normal  pulses all extremities   Neurologic Exam Mental Status: Awake and fully alert.  Fluent speech and language.  Oriented to place and time. Recent and remote memory intact. Attention span, concentration and fund of knowledge appropriate. Mood and affect appropriate.  Cranial Nerves: Pupils equal, briskly reactive to light. Extraocular movements full without nystagmus. Visual fields full to confrontation. Hearing intact. Facial sensation intact. Face, tongue, palate moves normally and symmetrically.  Motor: Normal strength in all tested extremity muscles.  Slightly increased tone RUE, right cogwheel rigidity. Mild intermittent R>L resting tremor.  Intermittent right foot tremor.  Unable to appreciate intention tremor Sensory.: intact to touch , pinprick , position and vibratory sensation.  Coordination: Rapid alternating movements good in upper extremities, slightly decreased foot taps bilaterally. Finger-to-nose and heel-to-shin performed accurately bilaterally. Gait and Station: Arises from chair without difficulty. Stance is normal. Gait demonstrates normal stride length and balance without use of AD.  No evidence of shuffling or freezing.  Mild tremor in both upper extremities when walking. Reflexes: 1+ and symmetric. Toes downgoing.         ASSESSMENT/PLAN: Teresa Logan is Logan 56 y.o. year old female   Right-sided predominant Parkinson's disease -Continue Sinemet 25/100  1.5 tabs 5 times daily, discussed taking more frequently during the day as intermittent 2am dose likely not providing any benefit, she verbalized understanding. Prior issue with taking too many tablets (see telephone note on 05/14/2022), advised to not change amt of tablets or frequency of medication unless instructed otherwise, she verbalized understanding -Continue Mirapex 0.5 mg 3 times daily -Discussed use of over-the-counter laxatives such as MiraLAX to help with constipation as well as ensuring adequate water  intake and routine exercise     Follow up in 6 months or call earlier if needed    CC:  PCP: Serita Butcher, MD    I spent 26 minutes of face-to-face and non-face-to-face time with patient.  This included previsit chart review, lab review,  study review, order entry, electronic health record documentation, patient education regarding above diagnoses and treatment plan and answered all other questions to patient satisfaction  Frann Rider, [facility]  [facility] [address] [place], [place] [address]  Phone [phone] Fax [phone] Note: This document was prepared with digital dictation and possible smart phrase technology. Any transcriptional errors that result from this process are unintentional.

## 2022-07-13 ENCOUNTER — Encounter: Payer: Self-pay | Admitting: Adult Health

## 2022-07-13 ENCOUNTER — Ambulatory Visit (INDEPENDENT_AMBULATORY_CARE_PROVIDER_SITE_OTHER): Payer: Medicare Other | Admitting: Adult Health

## 2022-07-13 DIAGNOSIS — G20A1 Parkinson's disease without dyskinesia, without mention of fluctuations: Secondary | ICD-10-CM

## 2022-07-13 MED ORDER — PRAMIPEXOLE DIHYDROCHLORIDE 0.5 MG PO TABS: 0.5000 mg | ORAL_TABLET | Freq: Three times a day (TID) | ORAL | 5 refills | Status: DC

## 2022-07-13 MED ORDER — CARBIDOPA-LEVODOPA 25-100 MG PO TABS: ORAL_TABLET | ORAL | 5 refills | Status: DC

## 2022-07-13 NOTE — Patient Instructions (Signed)
Your Plan:  Continue Sinemet 1.5 tablets 5 times daily  Continue Mirapex 1 tab three times daily    Follow up in 6 months or call earlier if needed     Thank you for coming to see [place] at [facility]. I hope we have been able to provide you high quality care today.  You may receive a patient satisfaction survey over the next few weeks. We would appreciate your feedback and comments so that we may continue to improve ourselves and the health of our patients.

## 2022-08-03 ENCOUNTER — Ambulatory Visit (INDEPENDENT_AMBULATORY_CARE_PROVIDER_SITE_OTHER): Payer: Medicare Other

## 2022-08-03 ENCOUNTER — Encounter: Payer: Self-pay | Admitting: Student

## 2022-08-03 ENCOUNTER — Other Ambulatory Visit: Payer: Self-pay

## 2022-08-03 ENCOUNTER — Ambulatory Visit (INDEPENDENT_AMBULATORY_CARE_PROVIDER_SITE_OTHER): Payer: Medicare Other | Admitting: Student

## 2022-08-03 VITALS — BP 143/97 | HR 92 | Temp 98.2°F | Ht 66.0 in | Wt 251.2 lb

## 2022-08-03 VITALS — BP 127/83 | HR 92 | Temp 98.2°F | Ht 66.0 in | Wt 251.2 lb

## 2022-08-03 DIAGNOSIS — I10 Essential (primary) hypertension: Secondary | ICD-10-CM

## 2022-08-03 DIAGNOSIS — Z0000 Encounter for general adult medical examination without abnormal findings: Secondary | ICD-10-CM

## 2022-08-03 DIAGNOSIS — G20A1 Parkinson's disease without dyskinesia, without mention of fluctuations: Secondary | ICD-10-CM

## 2022-08-03 DIAGNOSIS — E785 Hyperlipidemia, unspecified: Secondary | ICD-10-CM

## 2022-08-03 DIAGNOSIS — E119 Type 2 diabetes mellitus without complications: Secondary | ICD-10-CM | POA: Diagnosis not present

## 2022-08-03 LAB — POCT GLYCOSYLATED HEMOGLOBIN (HGB A1C): Hemoglobin A1C: 7.3 % — AB (ref 4.0–5.6)

## 2022-08-03 LAB — GLUCOSE, CAPILLARY: Glucose-Capillary: 132 mg/dL — ABNORMAL HIGH (ref 70–99)

## 2022-08-03 MED ORDER — EMPAGLIFLOZIN 10 MG PO TABS: 10.0000 mg | ORAL_TABLET | Freq: Every day | ORAL | 11 refills | Status: DC

## 2022-08-03 NOTE — Assessment & Plan Note (Addendum)
BP: 127/83  BP well controlled on repeat today. She is on olmesartan-amlodipine-HCTZ 40-5-12.5 mg daily. Tolerating medication and reports good adherence. She is asymptomatic today.   Plan -Continue olmesartan-amlodipine-HCTZ -BMP today

## 2022-08-03 NOTE — Patient Instructions (Addendum)
Thank you, Ms.Sheliah Hatch for allowing [place] to provide your care today.   Blood Pressure -Blood pressure elevated today.  -Please continue taking your olmesartan-amlodipine-HCTZ daily.  Diabetes -A1c today was 7.3% -We discussed starting Jardiance 10 mg daily.  -Will repeat A1c in 3 months. -Please continue healthy eating and exercise.   -Blood work today to check cholesterol, electrolytes and kidney function.  -Urine today to check for protein.    I have ordered the following labs for you:  Lab Orders         Glucose, capillary         Lipid Profile         BMP8+Anion Gap         Microalbumin / Creatinine Urine Ratio         POC Hbg A1C       Referrals ordered today:   Referral Orders  No referral(s) requested today     I have ordered the following medication/changed the following medications:   Stop the following medications: Medications Discontinued During This Encounter  Medication Reason   ibuprofen (ADVIL,MOTRIN) 600 MG tablet      Start the following medications: Meds ordered this encounter  Medications   empagliflozin (JARDIANCE) 10 MG TABS tablet    Sig: Take 1 tablet (10 mg total) by mouth daily before breakfast.    Dispense:  30 tablet    Refill:  11     Follow up: 3 months    Should you have any questions or concerns please call the internal medicine clinic at [phone].    Angelique Blonder, D.O. [place]

## 2022-08-03 NOTE — Assessment & Plan Note (Addendum)
A1c today was 7.3 from 6.5. Has been on lifestyle modifications for diabetes management.  She has made changes to her diet. [facility] but recently it has been less. We discussed extensively regarding medications for diabetes.  She is hesitant on taking GLP-1 due to GI side effects.  Discussed SGLT2 and she is open to trying that first.  Denies any history of yeast infection or recurrent UTIs. We will start at 10 mg dose of Jardiance and re-evaluate at next visit.   Plan -Continue lifestyle modifications -Start Jardiance 10 mg daily -Urine microalbumin today -Repeat A1c in 3 months

## 2022-08-03 NOTE — Progress Notes (Signed)
CC: Diabetes   HPI:  Teresa Logan is a 56 y.o. female living with a history stated below and presents today for diabetes follow-up. Please see problem based assessment and plan for additional details.  Past Medical History:  Diagnosis Date   Breast cancer screening    Encounter for hepatitis C screening test for low risk patient 08/28/2020   Encounter for screening for HIV 08/28/2020   HTN (hypertension)    Kidney stones    Need for shingles vaccine 12/30/2021   Parkinson's disease    Screening for colorectal cancer    Tremors of nervous system     Current Outpatient Medications on File Prior to Visit  Medication Sig Dispense Refill   aspirin EC 81 MG tablet Take 1 tablet (81 mg total) by mouth daily. 90 tablet 3   Blood Glucose Monitoring Suppl (ONE TOUCH ULTRA MINI) w/Device KIT 1 kit by Does not apply route daily as needed (For hypergycemia, signs can be increased urination, increased thirst, or nausea.). 1 kit 0   carbidopa-levodopa (SINEMET IR) 25-100 MG tablet Take 1.5 tabs 5x daily 225 tablet 5   glucose blood test strip Use as instructed 100 each 12   Olmesartan-amLODIPine-HCTZ 40-5-12.5 MG TABS Take 1 tablet by mouth daily. 90 tablet 2   OneTouch Delica Lancets 35T MISC 1 each by Does not apply route daily as needed (for hyperglycemia). 100 each 12   pramipexole (MIRAPEX) 0.5 MG tablet Take 1 tablet (0.5 mg total) by mouth 3 (three) times daily. 90 tablet 5   rosuvastatin (CRESTOR) 10 MG tablet TAKE 1 TABLET(10 MG) BY MOUTH DAILY 90 tablet 3   No current facility-administered medications on file prior to visit.   Review of Systems: ROS negative except for what is noted on the assessment and plan.  Vitals:   08/03/22 1507 08/03/22 1615  BP: (!) 143/97 127/83  Pulse: 92 92  Temp: 98.2 F (36.8 C)   TempSrc: Oral   SpO2: 98%   Weight: 251 lb 3.2 oz (113.9 kg)   Height: _0  (1.676 m)    Physical Exam: Constitutional: well-appearing female, sitting  in chair, in no acute distress HENT: normocephalic atraumatic Neck: supple Cardiovascular: regular rate and rhythm, no m/r/g Pulmonary/Chest: normal work of breathing on room air MSK: normal bulk and tone Neurological: alert & oriented x 3 Skin: warm and dry Psych: normal mood and behavior  Assessment & Plan:   Hypertension BP: 127/83  BP well controlled on repeat today. She is on olmesartan-amlodipine-HCTZ 40-5-12.5 mg daily. Tolerating medication and reports good adherence. She is asymptomatic today.   Plan -Continue olmesartan-amlodipine-HCTZ -BMP today  Type 2 diabetes mellitus (HCC) A1c today was 7.3 from 6.5. Has been on lifestyle modifications for diabetes management.  She has made changes to her diet. [facility] but recently it has been less. We discussed extensively regarding medications for diabetes.  She is hesitant on taking GLP-1 due to GI side effects.  Discussed SGLT2 and she is open to trying that first.  Denies any history of yeast infection or recurrent UTIs. We will start at 10 mg dose of Jardiance and re-evaluate at next visit.   Plan -Continue lifestyle modifications -Start Jardiance 10 mg daily -Urine microalbumin today -Repeat A1c in 3 months  Parkinson's disease [facility]) Patient follows with [facility] for Parkinson's management. She is on Sinemet 25-100 mg and Mirapex 0.5 mg. Denies any new or worsening  symptoms.  Plan -Continue following with [facility] -Continue with Sinemet and Mirapex  Hyperlipidemia Lipid Panel     Component Value Date/Time   CHOL 182 05/06/2021 1552   TRIG 310 (H) 05/06/2021 1552   HDL 46 05/06/2021 1552   CHOLHDL 4.0 05/06/2021 1552   LDLCALC 85 05/06/2021 1552   LABVLDL 51 (H) 05/06/2021 1552    Last lipid panel in 2022, LDL less than 100.  She is on Crestor 10 mg daily.  Tolerating medication and reports good adherence.  Plan -Repeat lipid panel today -Continue  Crestor 10 mg   Patient seen with Dr. Andris Baumann, D.O. [facility], PGY-1 Phone: [phone] Date 08/03/2022 Time 9:24 PM

## 2022-08-03 NOTE — Assessment & Plan Note (Signed)
Lipid Panel     Component Value Date/Time   CHOL 182 05/06/2021 1552   TRIG 310 (H) 05/06/2021 1552   HDL 46 05/06/2021 1552   CHOLHDL 4.0 05/06/2021 1552   LDLCALC 85 05/06/2021 1552   LABVLDL 51 (H) 05/06/2021 1552    Last lipid panel in 2022, LDL less than 100.  She is on Crestor 10 mg daily.  Tolerating medication and reports good adherence.  Plan -Repeat lipid panel today -Continue Crestor 10 mg

## 2022-08-03 NOTE — Assessment & Plan Note (Signed)
Patient follows with [facility] for Parkinson's management. She is on Sinemet 25-100 mg and Mirapex 0.5 mg. Denies any new or worsening symptoms.  Plan -Continue following with [facility] -Continue with Sinemet and Mirapex

## 2022-08-04 LAB — BMP8+ANION GAP
Anion Gap: 19 mmol/L — ABNORMAL HIGH (ref 10.0–18.0)
BUN/Creatinine Ratio: 14 (ref 9–23)
BUN: 10 mg/dL (ref 6–24)
CO2: 23 mmol/L (ref 20–29)
Calcium: 10 mg/dL (ref 8.7–10.2)
Chloride: 101 mmol/L (ref 96–106)
Creatinine, Ser: 0.73 mg/dL (ref 0.57–1.00)
Glucose: 116 mg/dL — ABNORMAL HIGH (ref 70–99)
Potassium: 4.3 mmol/L (ref 3.5–5.2)
Sodium: 143 mmol/L (ref 134–144)
eGFR: 96 mL/min/{1.73_m2} (ref >59)

## 2022-08-04 LAB — MICROALBUMIN / CREATININE URINE RATIO
Creatinine, Urine: 246.5 mg/dL
Microalb/Creat Ratio: 13 mg/g creat (ref 0–29)
Microalbumin, Urine: 32 ug/mL

## 2022-08-04 LAB — LIPID PANEL
Chol/HDL Ratio: 4.6 ratio — ABNORMAL HIGH (ref 0.0–4.4)
Cholesterol, Total: 181 mg/dL (ref 100–199)
HDL: 39 mg/dL — ABNORMAL LOW (ref >39)
LDL Chol Calc (NIH): 93 mg/dL (ref 0–99)
Triglycerides: 293 mg/dL — ABNORMAL HIGH (ref 0–149)
VLDL Cholesterol Cal: 49 mg/dL — ABNORMAL HIGH (ref 5–40)

## 2022-08-04 NOTE — Addendum Note (Signed)
Addended by: Angelique Blonder on: 08/04/2022 05:05 PM   Modules accepted: Level of Service

## 2022-08-04 NOTE — Progress Notes (Signed)
Subjective:   Teresa Logan is a 56 y.o. female who presents for an Initial Medicare Annual Wellness Visit. I connected with  Sheliah Hatch on 08/04/22 by a  Face-To-Face  enabled telemedicine application and verified that I am speaking with the correct person using two identifiers.  Patient Location: Other:  Office/Clinic  Provider Location: Office/Clinic  I discussed the limitations of evaluation and management by telemedicine. The patient expressed understanding and agreed to proceed.  Review of Systems    Defer to PCP       Objective:    Today's Vitals   08/04/22 0904 08/04/22 0905  BP: (!) 143/97   Pulse: 92   Temp: 98.2 F (36.8 C)   TempSrc: Oral   SpO2: 98%   Weight: 251 lb 3.2 oz (113.9 kg)   Height: _0  (1.676 m)   PainSc:  0-No pain   Body mass index is 40.54 kg/m.     08/04/2022    9:06 AM 08/03/2022    3:02 PM 01/19/2022    1:25 PM 12/29/2021    2:00 PM 08/11/2021    1:10 PM 05/06/2021    2:57 PM 08/27/2020    3:26 PM  Advanced Directives  Does Patient Have a Medical Advance Directive? _1  No No  Would patient like information on creating a medical advance directive? No - Patient declined No - Patient declined No - Patient declined No - Patient declined  No - Patient declined No - Patient declined    Current Medications (verified) Outpatient Encounter Medications as of 08/03/2022  Medication Sig   aspirin EC 81 MG tablet Take 1 tablet (81 mg total) by mouth daily.   Blood Glucose Monitoring Suppl (ONE TOUCH ULTRA MINI) w/Device KIT 1 kit by Does not apply route daily as needed (For hypergycemia, signs can be increased urination, increased thirst, or nausea.).   carbidopa-levodopa (SINEMET IR) 25-100 MG tablet Take 1.5 tabs 5x daily   empagliflozin (JARDIANCE) 10 MG TABS tablet Take 1 tablet (10 mg total) by mouth daily before breakfast.   glucose blood test strip Use as instructed   Olmesartan-amLODIPine-HCTZ 40-5-12.5  MG TABS Take 1 tablet by mouth daily.   OneTouch Delica Lancets 53Z MISC 1 each by Does not apply route daily as needed (for hyperglycemia).   pramipexole (MIRAPEX) 0.5 MG tablet Take 1 tablet (0.5 mg total) by mouth 3 (three) times daily.   rosuvastatin (CRESTOR) 10 MG tablet TAKE 1 TABLET(10 MG) BY MOUTH DAILY   No facility-administered encounter medications on file as of 08/03/2022.    Allergies (verified) Patient has no known allergies.   History: Past Medical History:  Diagnosis Date   Breast cancer screening    Encounter for hepatitis C screening test for low risk patient 08/28/2020   Encounter for screening for HIV 08/28/2020   HTN (hypertension)    Kidney stones    Need for shingles vaccine 12/30/2021   Parkinson's disease    Screening for colorectal cancer    Tremors of nervous system    Past Surgical History:  Procedure Laterality Date   TONSILLECTOMY     Family History  Problem Relation Age of Onset   Diabetes type II Mother    Alzheimer's disease Mother    Diabetes type II Father    Kidney disease Father    Healthy Son    Healthy Son    Healthy Son    Healthy Son    Social History  Socioeconomic History   Marital status: Single    Spouse name: Not on file   Number of children: Not on file   Years of education: Not on file   Highest education level: Not on file  Occupational History   Not on file  Tobacco Use   Smoking status: Never    Passive exposure: Yes   Smokeless tobacco: Never  Vaping Use   Vaping Use: Never used  Substance and Sexual Activity   Alcohol use: Yes    Alcohol/week: 2.0 standard drinks of alcohol    Types: 1 Glasses of wine, 1 Cans of beer per week    Comment: 1 drink per day, either wine or beer   Drug use: Never   Sexual activity: Yes  Other Topics Concern   Not on file  Social History Narrative   Right hand   One story home    Lives with domestic partner   Caffeine none .    Retired Marine scientist (traveler).  Retired 2019,  disabled.     Social Determinants of Health   Financial Resource Strain: Low Risk  (08/04/2022)   Overall Financial Resource Strain (CARDIA)    Difficulty of Paying Living Expenses: Not hard at all  Food Insecurity: No Food Insecurity (08/04/2022)   Hunger Vital Sign    Worried About Running Out of Food in the Last Year: Never true    Ran Out of Food in the Last Year: Never true  Transportation Needs: No Transportation Needs (08/04/2022)   PRAPARE - Hydrologist (Medical): No    Lack of Transportation (Non-Medical): No  Physical Activity: Insufficiently Active (08/04/2022)   Exercise Vital Sign    Days of Exercise per Week: 1 day    Minutes of Exercise per Session: 10 min  Stress: No Stress Concern Present (08/04/2022)   [place]    Feeling of Stress : Only a little  Social Connections: Socially Integrated (08/04/2022)   Social Connection and Isolation Panel [NHANES]    Frequency of Communication with Friends and Family: More than three times a week    Frequency of Social Gatherings with Friends and Family: More than three times a week    Attends Religious Services: 1 to 4 times per year    Active Member of [employer] or Organizations: No    Attends Music therapist: 1 to 4 times per year    Marital Status: Living with partner    Tobacco Counseling Counseling given: Not Answered   Clinical Intake:  Pre-visit preparation completed: Yes  Pain : No/denies pain Pain Score: 0-No pain     Nutritional Risks: None Diabetes: Yes  How often do you need to have someone help you when you read instructions, pamphlets, or other written materials from your doctor or pharmacy?: 1 - Never What is the last grade level you completed in school?: 16 years  Diabetic?Yes  Interpreter Needed?: No  Information entered by :: Tarrie Mcmichen,cma 08/04/22 9:06am   Activities of Daily Living     08/04/2022    9:07 AM 01/19/2022    1:25 PM  In your present state of health, do you have any difficulty performing the following activities:  Hearing? 0 0  Vision? 0 1  Difficulty concentrating or making decisions? 0 1  Walking or climbing stairs? 0 1  Dressing or bathing? 0 0  Doing errands, shopping? 0 0    Patient Care Team: Jodi Mourning,  Quillian Quince, MD as PCP - General  Indicate any recent Medical Services you may have received from other than [facility] providers in the past year (date may be approximate).     Assessment:   This is a routine wellness examination for Teresa Logan.  Hearing/Vision screen No results found.  Dietary issues and exercise activities discussed:     Goals Addressed   None   Depression Screen    08/04/2022    9:06 AM 08/03/2022    4:15 PM 12/29/2021    2:00 PM 05/06/2021    2:57 PM 08/27/2020    4:26 PM 04/29/2020    2:50 PM 03/25/2020    1:27 PM  PHQ 2/9 Scores  PHQ - 2 Score 0 0 0 0 _0 PHQ- 9 Score 1 1 0  _1 Fall Risk    08/04/2022    9:06 AM 08/03/2022    3:03 PM 01/19/2022    1:25 PM 12/29/2021    2:00 PM 09/01/2021    2:25 PM  Fall Risk   Falls in the past year? 0 0 0 0 0  Number falls in past yr: 0  0 0 0  Injury with Fall? 0  0 0 0  Risk for fall due to : No Fall Risks No Fall Risks  No Fall Risks   Follow up Falls evaluation completed;Falls prevention discussed Falls evaluation completed Falls evaluation completed Falls evaluation completed;Falls prevention discussed Falls evaluation completed    FALL RISK PREVENTION PERTAINING TO THE HOME:  Any stairs in or around the home? No  If so, are there any without handrails? No  Home free of loose throw rugs in walkways, pet beds, electrical cords, etc? Yes  Adequate lighting in your home to reduce risk of falls? Yes   ASSISTIVE DEVICES UTILIZED TO PREVENT FALLS:  Life alert?  Patient refusal: Did not answer question Use of a cane, walker or w/c? Patient refusal: Did not answer  question Grab bars in the bathroom? Patient refusal: Did not answer question Shower chair or bench in shower? Patient refusal: Did not answer question Elevated toilet seat or a handicapped toilet? Patient refusal: Did not answer question  TIMED UP AND GO:  Was the test performed? Yes .  Length of time to ambulate 10 feet: 1 min   Gait slow and steady without use of assistive device  Cognitive Function:        08/04/2022    9:07 AM  6CIT Screen  What Year? 0 points  What month? 0 points  What time? 0 points  Count back from 20 0 points  Months in reverse 0 points  Repeat phrase 0 points  Total Score 0 points    Immunizations Immunization History  Administered Date(s) Administered   Influenza,inj,Quad PF,6+ Mos 08/27/2020, 09/01/2021    TDAP status: Due, Education has been provided regarding the importance of this vaccine. Advised may receive this vaccine at local pharmacy or Health Dept. Aware to provide a copy of the vaccination record if obtained from local pharmacy or Health Dept. Verbalized acceptance and understanding.  Flu Vaccine status: Due, Education has been provided regarding the importance of this vaccine. Advised may receive this vaccine at local pharmacy or Health Dept. Aware to provide a copy of the vaccination record if obtained from local pharmacy or Health Dept. Verbalized acceptance and understanding.  Pneumococcal vaccine status: Due, Education has been provided regarding the importance of this vaccine. Advised may receive this vaccine  at local pharmacy or Health Dept. Aware to provide a copy of the vaccination record if obtained from local pharmacy or Health Dept. Verbalized acceptance and understanding.  Covid-19 vaccine status: Information provided on how to obtain vaccines.   Qualifies for Shingles Vaccine? No   Zostavax completed No   Shingrix Completed?: No.    Education has been provided regarding the importance of this vaccine. Patient has been  advised to call insurance company to determine out of pocket expense if they have not yet received this vaccine. Advised may also receive vaccine at local pharmacy or Health Dept. Verbalized acceptance and understanding.  Screening Tests Health Maintenance  Topic Date Due   COVID-19 Vaccine (1) Never done   OPHTHALMOLOGY EXAM  Never done   Diabetic kidney evaluation - Urine ACR  Never done   Zoster Vaccines- Shingrix (1 of 2) Never done   INFLUENZA VACCINE  04/28/2022   TETANUS/TDAP  01/20/2023 (Originally 06/27/1985)   Diabetic kidney evaluation - GFR measurement  12/30/2022   COLON CANCER SCREENING ANNUAL FOBT  12/30/2022   FOOT EXAM  01/20/2023   HEMOGLOBIN A1C  02/01/2023   Medicare Annual Wellness (AWV)  08/04/2023   MAMMOGRAM  01/20/2024   PAP SMEAR-Modifier  01/19/2025   Hepatitis C Screening  Completed   HIV Screening  Completed   HPV VACCINES  Aged Out   COLONOSCOPY (Pts 45-8yr Insurance coverage will need to be confirmed)  Discontinued    Health Maintenance  Health Maintenance Due  Topic Date Due   COVID-19 Vaccine (1) Never done   OPHTHALMOLOGY EXAM  Never done   Diabetic kidney evaluation - Urine ACR  Never done   Zoster Vaccines- Shingrix (1 of 2) Never done   INFLUENZA VACCINE  04/28/2022    Colorectal cancer screening: Type of screening: FOBT/FIT. Completed 12/29/2021. Repeat every 1 years  Mammogram status: Completed 01/19/2022. Repeat every year:"2    Lung Cancer Screening: (Low Dose CT Chest recommended if Age 57-80years, 30 pack-year currently smoking OR have quit w/in 15years.) does not qualify.   Lung Cancer Screening Referral: N/A  Additional Screening:  Hepatitis C Screening: does not qualify; Completed 08/27/2020  Vision Screening: Recommended annual ophthalmology exams for early detection of glaucoma and other disorders of the eye. Is the patient up to date with their annual eye exam?  Patient refusal: Did not answer question Who is the  provider or what is the name of the office in which the patient attends annual eye exams? Patient refusal: Did not answer question If pt is not established with a provider, would they like to be referred to a provider to establish care? Patient refusal: Did not answer question.   Dental Screening: Recommended annual dental exams for proper oral hygiene  Community Resource Referral / Chronic Care Management: CRR required this visit?  No   CCM required this visit?  No      Plan:     I have personally reviewed and noted the following in the patient's chart:   Medical and social history Use of alcohol, tobacco or illicit drugs  Current medications and supplements including opioid prescriptions. Patient is not currently taking opioid prescriptions. Functional ability and status Nutritional status Physical activity Advanced directives List of other physicians Hospitalizations, surgeries, and ER visits in previous 12 months Vitals Screenings to include cognitive, depression, and falls Referrals and appointments  In addition, I have reviewed and discussed with patient certain preventive protocols, quality metrics, and best practice recommendations. A written personalized  care plan for preventive services as well as general preventive health recommendations were provided to patient.     Kerin Perna, [place]   08/04/2022   Nurse Notes: Face-To-Face visit  [name] , Thank you for taking time to come for your Medicare Wellness Visit. I appreciate your ongoing commitment to your health goals. Please review the following plan we discussed and let me know if I can assist you in the future.   These are the goals we discussed:  Goals   None     This is a list of the screening recommended for you and due dates:  Health Maintenance  Topic Date Due   COVID-19 Vaccine (1) Never done   Eye exam for diabetics  Never done   Yearly kidney health urinalysis for diabetes  Never done    Zoster (Shingles) Vaccine (1 of 2) Never done   Flu Shot  04/28/2022   Tetanus Vaccine  01/20/2023*   Yearly kidney function blood test for diabetes  12/30/2022   Stool Blood Test  12/30/2022   Complete foot exam   01/20/2023   Hemoglobin A1C  02/01/2023   Medicare Annual Wellness Visit  08/04/2023   Mammogram  01/20/2024   Pap Smear  01/19/2025   Hepatitis C Screening: USPSTF Recommendation to screen - Ages 18-79 yo.  Completed   HIV Screening  Completed   HPV Vaccine  Aged Out   Colon Cancer Screening  Discontinued  *Topic was postponed. The date shown is not the original due date.

## 2022-08-05 ENCOUNTER — Other Ambulatory Visit: Payer: Self-pay

## 2022-08-05 DIAGNOSIS — E785 Hyperlipidemia, unspecified: Secondary | ICD-10-CM

## 2022-08-07 MED ORDER — ROSUVASTATIN CALCIUM 10 MG PO TABS: ORAL_TABLET | ORAL | 3 refills | Status: DC

## 2022-08-10 NOTE — Progress Notes (Signed)
Internal Medicine Clinic Attending  I saw and evaluated the patient.  I personally confirmed the key portions of the history and exam documented by Dr. Zheng and I reviewed pertinent patient test results.  The assessment, diagnosis, and plan were formulated together and I agree with the documentation in the resident's note.  

## 2022-08-14 DIAGNOSIS — H0102B Squamous blepharitis left eye, upper and lower eyelids: Secondary | ICD-10-CM | POA: Diagnosis not present

## 2022-08-14 DIAGNOSIS — H0102A Squamous blepharitis right eye, upper and lower eyelids: Secondary | ICD-10-CM | POA: Diagnosis not present

## 2022-08-14 DIAGNOSIS — H52223 Regular astigmatism, bilateral: Secondary | ICD-10-CM | POA: Diagnosis not present

## 2022-08-14 DIAGNOSIS — H35033 Hypertensive retinopathy, bilateral: Secondary | ICD-10-CM | POA: Diagnosis not present

## 2022-08-14 DIAGNOSIS — H5203 Hypermetropia, bilateral: Secondary | ICD-10-CM | POA: Diagnosis not present

## 2022-08-14 DIAGNOSIS — H524 Presbyopia: Secondary | ICD-10-CM | POA: Diagnosis not present

## 2022-08-14 DIAGNOSIS — H2513 Age-related nuclear cataract, bilateral: Secondary | ICD-10-CM | POA: Diagnosis not present

## 2022-08-14 DIAGNOSIS — E119 Type 2 diabetes mellitus without complications: Secondary | ICD-10-CM | POA: Diagnosis not present

## 2022-08-14 LAB — HM DIABETES EYE EXAM

## 2022-08-14 NOTE — Progress Notes (Signed)
Internal Medicine Clinic Attending  Case and documentation reviewed.  I reviewed the AWV findings.  I agree with the assessment, diagnosis, and plan of care documented in the AWV note.     

## 2022-08-27 ENCOUNTER — Encounter: Payer: Self-pay | Admitting: Dietician

## 2022-09-10 ENCOUNTER — Ambulatory Visit: Payer: 59 | Admitting: Physician Assistant

## 2022-11-02 ENCOUNTER — Other Ambulatory Visit: Payer: Self-pay

## 2022-11-02 DIAGNOSIS — I10 Essential (primary) hypertension: Secondary | ICD-10-CM

## 2022-11-02 MED ORDER — OLMESARTAN-AMLODIPINE-HCTZ 40-5-12.5 MG PO TABS: 1.0000 | ORAL_TABLET | Freq: Every day | ORAL | 2 refills | Status: DC

## 2022-12-15 ENCOUNTER — Emergency Department (HOSPITAL_BASED_OUTPATIENT_CLINIC_OR_DEPARTMENT_OTHER)
Admission: EM | Admit: 2022-12-15 | Discharge: 2022-12-15 | Disposition: A | Discharge status: Home / Self Care | Payer: 59 | Attending: Emergency Medicine | Admitting: Emergency Medicine

## 2022-12-15 ENCOUNTER — Emergency Department (HOSPITAL_BASED_OUTPATIENT_CLINIC_OR_DEPARTMENT_OTHER): Payer: 59

## 2022-12-15 ENCOUNTER — Encounter (HOSPITAL_BASED_OUTPATIENT_CLINIC_OR_DEPARTMENT_OTHER): Payer: Self-pay

## 2022-12-15 ENCOUNTER — Other Ambulatory Visit: Payer: Self-pay

## 2022-12-15 DIAGNOSIS — R1031 Right lower quadrant pain: Secondary | ICD-10-CM | POA: Insufficient documentation

## 2022-12-15 DIAGNOSIS — I10 Essential (primary) hypertension: Secondary | ICD-10-CM | POA: Diagnosis not present

## 2022-12-15 DIAGNOSIS — Z7982 Long term (current) use of aspirin: Secondary | ICD-10-CM | POA: Diagnosis not present

## 2022-12-15 DIAGNOSIS — Z7984 Long term (current) use of oral hypoglycemic drugs: Secondary | ICD-10-CM | POA: Insufficient documentation

## 2022-12-15 DIAGNOSIS — E119 Type 2 diabetes mellitus without complications: Secondary | ICD-10-CM | POA: Diagnosis not present

## 2022-12-15 LAB — CBC WITH DIFFERENTIAL/PLATELET
Abs Immature Granulocytes: 0.15 10*3/uL — ABNORMAL HIGH (ref 0.00–0.07)
Basophils Absolute: 0.1 10*3/uL (ref 0.0–0.1)
Basophils Relative: 1 %
Eosinophils Absolute: 0.1 10*3/uL (ref 0.0–0.5)
Eosinophils Relative: 1 %
HCT: 46.3 % — ABNORMAL HIGH (ref 36.0–46.0)
Hemoglobin: 14.9 g/dL (ref 12.0–15.0)
Immature Granulocytes: 2 %
Lymphocytes Relative: 36 %
Lymphs Abs: 3.7 10*3/uL (ref 0.7–4.0)
MCH: 27.3 pg (ref 26.0–34.0)
MCHC: 32.2 g/dL (ref 30.0–36.0)
MCV: 85 fL (ref 80.0–100.0)
Monocytes Absolute: 0.6 10*3/uL (ref 0.1–1.0)
Monocytes Relative: 6 %
Neutro Abs: 5.6 10*3/uL (ref 1.7–7.7)
Neutrophils Relative %: 54 %
Platelets: 339 10*3/uL (ref 150–400)
RBC: 5.45 MIL/uL — ABNORMAL HIGH (ref 3.87–5.11)
RDW: 13.4 % (ref 11.5–15.5)
WBC: 10.2 10*3/uL (ref 4.0–10.5)
nRBC: 0 % (ref 0.0–0.2)

## 2022-12-15 LAB — URINALYSIS, ROUTINE W REFLEX MICROSCOPIC
Bacteria, UA: NONE SEEN
Bilirubin Urine: NEGATIVE
Glucose, UA: NEGATIVE mg/dL
Ketones, ur: NEGATIVE mg/dL
Nitrite: NEGATIVE
Protein, ur: NEGATIVE mg/dL
Specific Gravity, Urine: 1.019 (ref 1.005–1.030)
pH: 5.5 (ref 5.0–8.0)

## 2022-12-15 LAB — COMPREHENSIVE METABOLIC PANEL
ALT: <5 U/L (ref 0–44)
AST: 13 U/L — ABNORMAL LOW (ref 15–41)
Albumin: 3.9 g/dL (ref 3.5–5.0)
Alkaline Phosphatase: 82 U/L (ref 38–126)
Anion gap: 8 (ref 5–15)
BUN: 10 mg/dL (ref 6–20)
CO2: 30 mmol/L (ref 22–32)
Calcium: 9.9 mg/dL (ref 8.9–10.3)
Chloride: 99 mmol/L (ref 98–111)
Creatinine, Ser: 0.74 mg/dL (ref 0.44–1.00)
GFR, Estimated: >60 mL/min (ref >60)
Glucose, Bld: 155 mg/dL — ABNORMAL HIGH (ref 70–99)
Potassium: 3.5 mmol/L (ref 3.5–5.1)
Sodium: 137 mmol/L (ref 135–145)
Total Bilirubin: 0.3 mg/dL (ref 0.3–1.2)
Total Protein: 8.1 g/dL (ref 6.5–8.1)

## 2022-12-15 LAB — LIPASE, BLOOD: Lipase: 20 U/L (ref 11–51)

## 2022-12-15 MED ORDER — NAPROXEN 500 MG PO TABS: 500.0000 mg | ORAL_TABLET | Freq: Two times a day (BID) | ORAL | 0 refills | Status: DC

## 2022-12-15 MED ORDER — SODIUM CHLORIDE 0.9 % IV BOLUS
1000.0000 mL | Freq: Once | INTRAVENOUS | Status: AC
  Administered 2022-12-15: 1000 mL via INTRAVENOUS

## 2022-12-15 MED ORDER — IOHEXOL 300 MG/ML  SOLN
100.0000 mL | Freq: Once | INTRAMUSCULAR | Status: AC | PRN
  Administered 2022-12-15: 100 mL via INTRAVENOUS

## 2022-12-15 MED ORDER — TRAMADOL HCL 50 MG PO TABS: 50.0000 mg | ORAL_TABLET | Freq: Four times a day (QID) | ORAL | 0 refills | Status: DC | PRN

## 2022-12-15 NOTE — Discharge Instructions (Signed)
Begin taking naproxen as prescribed.  Begin taking tramadol as prescribed as needed for pain not relieved with naproxen.  Follow-up with your primary doctor if symptoms are not improving in the next week, and return to the ER if symptoms significantly worsen or change.

## 2022-12-15 NOTE — ED Provider Notes (Signed)
[facility] Provider Note   CSN: [id number] Arrival date & time: 12/15/22  0426     History  Chief Complaint  Patient presents with   Abdominal Pain    Teresa Logan is a 57 y.o. female.  Patient is a 57 year old female with past medical history of Parkinson's disease, hyperlipidemia, diabetes, hypertension.  Patient presenting today for evaluation of right-sided abdominal pain.  This has been present for the past 3 weeks.  She describes a constant pain that is worse when she moves, sits upright, and attempts to ambulate.  She denies any bowel or bladder complaints.  She denies any fevers or chills.  There are no alleviating factors.  The history is provided by the patient.       Home Medications Prior to Admission medications   Medication Sig Start Date End Date Taking? Authorizing Provider  aspirin EC 81 MG tablet Take 1 tablet (81 mg total) by mouth daily. 09/09/21   Lyndal Pulley, MD  Blood Glucose Monitoring Suppl (ONE TOUCH ULTRA MINI) w/Device KIT 1 kit by Does not apply route daily as needed (For hypergycemia, signs can be increased urination, increased thirst, or nausea.). 12/30/21   Lyndal Pulley, MD  carbidopa-levodopa (SINEMET IR) 25-100 MG tablet Take 1.5 tabs 5x daily 07/13/22   Frann Rider, NP  empagliflozin (JARDIANCE) 10 MG TABS tablet Take 1 tablet (10 mg total) by mouth daily before breakfast. 08/03/22   Angelique Blonder, DO  glucose blood test strip Use as instructed 12/30/21   Lyndal Pulley, MD  Olmesartan-amLODIPine-HCTZ 40-5-12.5 MG TABS Take 1 tablet by mouth daily. 11/02/22   Serita Butcher, MD  OneTouch Delica Lancets 99991111 MISC 1 each by Does not apply route daily as needed (for hyperglycemia). 12/30/21   Lyndal Pulley, MD  pramipexole (MIRAPEX) 0.5 MG tablet Take 1 tablet (0.5 mg total) by mouth 3 (three) times daily. 07/13/22   Frann Rider, NP  rosuvastatin (CRESTOR) 10 MG tablet TAKE 1 TABLET(10 MG)  BY MOUTH DAILY 08/07/22   Serita Butcher, MD      Allergies    Patient has no known allergies.    Review of Systems   Review of Systems  All other systems reviewed and are negative.   Physical Exam Updated Vital Signs BP (!) 130/100   Pulse 86   Temp 98.4 F (36.9 C) (Oral)   Resp 18   Ht 5\' 6"  (1.676 m)   Wt 113.4 kg   SpO2 98%   BMI 40.35 kg/m  Physical Exam Vitals and nursing note reviewed.  Constitutional:      General: She is not in acute distress.    Appearance: She is well-developed. She is not diaphoretic.  HENT:     Head: Normocephalic and atraumatic.  Cardiovascular:     Rate and Rhythm: Normal rate and regular rhythm.     Heart sounds: No murmur heard.    No friction rub. No gallop.  Pulmonary:     Effort: Pulmonary effort is normal. No respiratory distress.     Breath sounds: Normal breath sounds. No wheezing.  Abdominal:     General: Bowel sounds are normal. There is no distension.     Palpations: Abdomen is soft.     Tenderness: There is abdominal tenderness in the right lower quadrant. There is no right CVA tenderness, left CVA tenderness, guarding or rebound.  Musculoskeletal:        General: Normal range of motion.  Cervical back: Normal range of motion and neck supple.  Skin:    General: Skin is warm and dry.  Neurological:     General: No focal deficit present.     Mental Status: She is alert and oriented to person, place, and time.     ED Results / Procedures / Treatments   Labs (all labs ordered are listed, but only abnormal results are displayed) Labs Reviewed  COMPREHENSIVE METABOLIC PANEL  LIPASE, BLOOD  CBC WITH DIFFERENTIAL/PLATELET  URINALYSIS, ROUTINE W REFLEX MICROSCOPIC    EKG None  Radiology No results found.  Procedures Procedures    Medications Ordered in ED Medications  sodium chloride 0.9 % bolus 1,000 mL (has no administration in time range)    ED Course/ Medical Decision Making/ A&P  Patient is a  57 year old female presenting with complaints of pain to the right side of her abdomen for the past 3 weeks.  Pain seems to be worse when she moves and attempts to ambulate.  Patient arrives here with stable vital signs and is afebrile.  She has tenderness to palpation to the right lower quadrant, but exam is otherwise unremarkable.  Workup initiated including CBC, metabolic panel, lipase, and urinalysis.  All of these studies are basically unremarkable.  I did obtain a CT scan of the abdomen and pelvis showing no acute intra-abdominal process and no explanation for her symptoms.  At this point, cause of her pain is unclear, but I feel as though I have ruled out emergent pathology including appendicitis, small bowel obstruction, acute cholecystitis, or renal calculus.  Patient to be discharged with NSAIDs, tramadol, and follow-up as needed if not improving.  Final Clinical Impression(s) / ED Diagnoses Final diagnoses:  None    Rx / DC Orders ED Discharge Orders     None         Veryl Speak, MD 12/15/22 [phone]

## 2022-12-15 NOTE — ED Notes (Addendum)
Patient transported to CT via rad staff  

## 2022-12-15 NOTE — ED Triage Notes (Signed)
Reports RLQ pain x3 weeks. Also reports nausea. Denies urinary s/sx.

## 2022-12-15 NOTE — ED Notes (Signed)
PT ambulated to restroom without assistance.

## 2022-12-21 ENCOUNTER — Telehealth: Payer: Self-pay

## 2022-12-21 NOTE — Telephone Encounter (Signed)
        Patient  visited [facility] on 12/15/2022  for Abdominal Pain.   Telephone encounter attempt :  1st  A HIPAA compliant voice message was left requesting a return call.  Instructed patient to call back at [phone].   [place] Resource Care Guide   [email or web address]  ?? [id number]   Website: [email or web address]  [email or web address]

## 2022-12-22 ENCOUNTER — Telehealth: Payer: Self-pay | Admitting: Adult Health

## 2022-12-22 ENCOUNTER — Telehealth: Payer: Self-pay

## 2022-12-22 NOTE — Telephone Encounter (Signed)
        Patient  visited [facility] on 12/15/2022  for Abdominal Pain.   Telephone encounter attempt :  2nd  A HIPAA compliant voice message was left requesting a return call.  Instructed patient to call back at [phone].   [place] Resource Care Guide   ??[email or web address]  ?? [id number]   Website: [email or web address]  [email or web address]

## 2022-12-22 NOTE — Telephone Encounter (Signed)
LVM and sent mychart msg informing pt of r/s needed for 4/18 appt- NP out.

## 2023-01-12 ENCOUNTER — Ambulatory Visit: Payer: 59 | Admitting: Adult Health

## 2023-01-14 ENCOUNTER — Ambulatory Visit: Payer: 59 | Admitting: Adult Health

## 2023-02-02 ENCOUNTER — Other Ambulatory Visit: Payer: Self-pay

## 2023-02-02 DIAGNOSIS — G20A1 Parkinson's disease without dyskinesia, without mention of fluctuations: Secondary | ICD-10-CM

## 2023-02-02 MED ORDER — PRAMIPEXOLE DIHYDROCHLORIDE 0.5 MG PO TABS: 0.5000 mg | ORAL_TABLET | Freq: Three times a day (TID) | ORAL | 5 refills | Status: DC

## 2023-02-24 ENCOUNTER — Other Ambulatory Visit: Payer: Self-pay

## 2023-02-24 DIAGNOSIS — G20A1 Parkinson's disease without dyskinesia, without mention of fluctuations: Secondary | ICD-10-CM

## 2023-02-24 MED ORDER — CARBIDOPA-LEVODOPA 25-100 MG PO TABS: ORAL_TABLET | ORAL | 5 refills | Status: DC

## 2023-03-02 ENCOUNTER — Encounter: Payer: Self-pay | Admitting: *Deleted

## 2023-03-16 ENCOUNTER — Other Ambulatory Visit: Payer: Self-pay | Admitting: Student

## 2023-03-16 DIAGNOSIS — Z1231 Encounter for screening mammogram for malignant neoplasm of breast: Secondary | ICD-10-CM

## 2023-03-17 ENCOUNTER — Ambulatory Visit (INDEPENDENT_AMBULATORY_CARE_PROVIDER_SITE_OTHER): Payer: 59 | Admitting: Neurology

## 2023-03-17 ENCOUNTER — Encounter: Payer: Self-pay | Admitting: Neurology

## 2023-03-17 VITALS — BP 142/95 | HR 102 | Ht 66.0 in | Wt 244.8 lb

## 2023-03-17 DIAGNOSIS — G20A1 Parkinson's disease without dyskinesia, without mention of fluctuations: Secondary | ICD-10-CM | POA: Diagnosis not present

## 2023-03-17 DIAGNOSIS — G4700 Insomnia, unspecified: Secondary | ICD-10-CM

## 2023-03-17 DIAGNOSIS — E669 Obesity, unspecified: Secondary | ICD-10-CM | POA: Diagnosis not present

## 2023-03-17 DIAGNOSIS — G479 Sleep disorder, unspecified: Secondary | ICD-10-CM

## 2023-03-17 DIAGNOSIS — Z9189 Other specified personal risk factors, not elsewhere classified: Secondary | ICD-10-CM | POA: Diagnosis not present

## 2023-03-17 DIAGNOSIS — R0683 Snoring: Secondary | ICD-10-CM | POA: Diagnosis not present

## 2023-03-17 DIAGNOSIS — G4719 Other hypersomnia: Secondary | ICD-10-CM

## 2023-03-17 NOTE — Progress Notes (Signed)
Subjective:    Patient ID: Teresa Logan, female    DOB: Nov 19, 1965, 57 y.o.   MRN: [id number]  HPI    Interim history:   [name] is a 57 year old right-handed woman with an underlying medical history of hypertension, kidney stones, prediabetes, and obesity, who presents for follow-up consultation of her Parkinson's disease.  The patient is unaccompanied today.  She was last seen in this clinic by Ihor Austin, NP on 07/13/2022, at which time she was on Sinemet 1.5 pills 5 times a day.  She was not sleeping well.  She was on pramipexole 3 times a day.  She was advised to use MiraLAX for constipation.  She was advised to continue with her Sinemet 25-100 mg strength 1-1/2 pills 5 times a day.  Today, 03/17/2023: She reports doing fairly.  She has significant difficulty with her sleep and erratic sleep.  She used to work nights and often sleeps during the day, may go to bed around 10 AM and sleep till 4 or 5 AM but sleep is interrupted, she takes her blood pressure medication, goes to the bathroom, takes her Sinemet and goes back to sleep.  She does not sleep well at night, she had tried trazodone in the past.  She snores, she never had a sleep study.  Her Epworth sleepiness score is 12 out of 24, fatigue severity score is 59 out of 63.  Her primary care prescribed Jardiance at the last visit but she is not taking it, she decided against it but did not notify her primary care.  Her A1c in November 2023 was 7.3 and had gone up compared to about 7 months prior.  She has a follow-up appointment this week.  She has not been exercising regularly.  She used to go to the gym up to 3 times a week.  She has not fallen in the recent past since the last visit.  She drinks no daily caffeine, occasional soda.  She drinks alcohol occasionally.  She does not smoke cigarettes but reports that she has smokers in the house who smoke inside the house.   The patient's allergies, current medications,  family history, past medical history, past social history, past surgical history and problem list were reviewed and updated as appropriate.   Previously:   She saw Ihor Austin, NP on 03/10/2022, at which time her Sinemet was increased to 4 times a day, 2 pills in the morning, 2 pills for the second dose, 1 pill for the third dose and 1 pill for the fourth dose.  She was advised to continue with pramipexole 0.5 mg 3 times daily.   11/03/21: (She) was previously diagnosed with Parkinson's disease.  She had follow-up with Dr. Lurena Joiner until May 2022, and started seeing Dr. Arbutus Leas in 04/21.  Prior to that, patient was followed by Dr. April Manson, [place] where patient previously lived.  Patient reports symptoms dating back to 2013 when she first started having of right foot tremor.  She had a DaTscan in or around 2014 which supported Parkinson's disease.  She was lost to follow-up until 2018 or 19 and started treatment with levodopa with her previous neurologist.  She feels that the medication has been helpful.  She currently takes levodopa 2 pills in the morning, 2 pills midday and 1 pill in the afternoon along with pramipexole 0.5 mg 3 times daily.  I reviewed Dr. Don Perking office notes from 02/20/2021, as well as 01/15/2020.  She has had some  weight gain.  She was advised to continue with generic Sinemet 25-100 mg strength as well as pramipexole 0.5 mg strength 1 pill 3 times daily.  She has been on pramipexole since November 2021. She has had intermittent constipation, has a bowel movement every other day approximately.  She does not always hydrate well and estimates that she drinks about 1 bottle of water per day on average.  She denies a family history of Parkinson's disease.  She has had 1 fall in 2022.  She reports that she fell in the bathroom at her daughter's house in [place].  She does not typically use a cane or walker.  She has had therapy.  She drinks alcohol daily in the form of 1 glass of wine or  1 beer.  She does not drink caffeine daily.  She does not have any records from her neurologist in [place].  She does not know where they are.  A DaTscan report was not available for my review today but some records were not available apparently when patient saw Dr. Arbutus Leas initially.  Her Past Medical History Is Significant For: Past Medical History:  Diagnosis Date   Breast cancer screening    Encounter for hepatitis C screening test for low risk patient 08/28/2020   Encounter for screening for HIV 08/28/2020   HTN (hypertension)    Kidney stones    Need for shingles vaccine 12/30/2021   Parkinson's disease    Screening for colorectal cancer    Tremors of nervous system     Her Past Surgical History Is Significant For: Past Surgical History:  Procedure Laterality Date   TONSILLECTOMY      Her Family History Is Significant For: Family History  Problem Relation Age of Onset   Diabetes type II Mother    Alzheimer's disease Mother    Diabetes type II Father    Kidney disease Father    Healthy Son    Healthy Son    Healthy Son    Healthy Son     Her Social History Is Significant For: Social History   Socioeconomic History   Marital status: Single    Spouse name: Not on file   Number of children: Not on file   Years of education: Not on file   Highest education level: Not on file  Occupational History   Not on file  Tobacco Use   Smoking status: Never    Passive exposure: Yes   Smokeless tobacco: Never  Vaping Use   Vaping Use: Never used  Substance and Sexual Activity   Alcohol use: Yes    Alcohol/week: 2.0 standard drinks of alcohol    Types: 1 Glasses of wine, 1 Cans of beer per week    Comment: 1 drink per day, either wine or beer   Drug use: Never   Sexual activity: Yes  Other Topics Concern   Not on file  Social History Narrative   Right hand   One story home    Lives with domestic partner   Caffeine none .    Retired Engineer, civil (consulting) (traveler).  Retired 2019,  disabled.     Social Determinants of Health   Financial Resource Strain: Low Risk  (08/04/2022)   Overall Financial Resource Strain (CARDIA)    Difficulty of Paying Living Expenses: Not hard at all  Food Insecurity: No Food Insecurity (08/04/2022)   Hunger Vital Sign    Worried About Running Out of Food in the Last Year: Never true  Ran Out of Food in the Last Year: Never true  Transportation Needs: No Transportation Needs (08/04/2022)   PRAPARE - Administrator, Civil Service (Medical): No    Lack of Transportation (Non-Medical): No  Physical Activity: Insufficiently Active (08/04/2022)   Exercise Vital Sign    Days of Exercise per Week: 1 day    Minutes of Exercise per Session: 10 min  Stress: No Stress Concern Present (08/04/2022)   [employer] of Occupational Health - Occupational Stress Questionnaire    Feeling of Stress : Only a little  Social Connections: Socially Integrated (08/04/2022)   Social Connection and Isolation Panel [NHANES]    Frequency of Communication with Friends and Family: More than three times a week    Frequency of Social Gatherings with Friends and Family: More than three times a week    Attends Religious Services: 1 to 4 times per year    Active Member of [employer] or Organizations: No    Attends Banker Meetings: 1 to 4 times per year    Marital Status: Living with partner    Her Allergies Are:  No Known Allergies:   Her Current Medications Are:  Outpatient Encounter Medications as of 03/17/2023  Medication Sig   aspirin EC 81 MG tablet Take 1 tablet (81 mg total) by mouth daily.   carbidopa-levodopa (SINEMET IR) 25-100 MG tablet Take 1.5 tabs 5x daily   Olmesartan-amLODIPine-HCTZ 40-5-12.5 MG TABS Take 1 tablet by mouth daily.   pramipexole (MIRAPEX) 0.5 MG tablet Take 1 tablet (0.5 mg total) by mouth 3 (three) times daily.   rosuvastatin (CRESTOR) 10 MG tablet TAKE 1 TABLET(10 MG) BY MOUTH DAILY   [DISCONTINUED] Blood  Glucose Monitoring Suppl (ONE TOUCH ULTRA MINI) w/Device KIT 1 kit by Does not apply route daily as needed (For hypergycemia, signs can be increased urination, increased thirst, or nausea.).   [DISCONTINUED] empagliflozin (JARDIANCE) 10 MG TABS tablet Take 1 tablet (10 mg total) by mouth daily before breakfast.   [DISCONTINUED] glucose blood test strip Use as instructed   [DISCONTINUED] naproxen (NAPROSYN) 500 MG tablet Take 1 tablet (500 mg total) by mouth 2 (two) times daily.   [DISCONTINUED] OneTouch Delica Lancets 33G MISC 1 each by Does not apply route daily as needed (for hyperglycemia).   [DISCONTINUED] traMADol (ULTRAM) 50 MG tablet Take 1 tablet (50 mg total) by mouth every 6 (six) hours as needed.   No facility-administered encounter medications on file as of 03/17/2023.  :  Review of Systems:  Out of a complete 14 point review of systems, all are reviewed and negative with the exception of these symptoms as listed below:  Review of Systems  Neurological:        Rm 9. Alone. She states she has intermittent parkinsonism symptoms such as shaking.       Objective:   Physical Exam   Physical Examination:   Vitals:   03/17/23 0753  BP: (!) 142/95  Pulse: (!) 102    General Examination: The patient is a very pleasant 57 y.o. female in no acute distress. She appears well-developed and well-nourished and well groomed.   HEENT: Normocephalic, atraumatic, pupils are equal, round and reactive to light, extraocular tracking is good without limitation to gaze excursion or nystagmus noted.  Corrective eyeglasses in place.  Hearing is grossly intact. Face is symmetric with no facial masking, mild nuchal rigidity noted.  She does not have a decreased blink rate.  Voice is hypophonic and no dysarthria  is noted, no carotid bruits.  Airway examination reveals mild mouth dryness, moderate airway crowding, tonsils absent, neck circumference 18 inches.  Tongue protrudes centrally and palate  elevates symmetrically. No lip, neck or jaw tremor.   Chest: Clear to auscultation without wheezing, rhonchi or crackles noted.   Heart: S1+S2+0, regular and normal without murmurs, rubs or gallops noted.    Abdomen: Soft, non-tender and non-distended.   Extremities: There is no pitting edema in the distal lower extremities bilaterally.    Skin: Warm and dry without trophic changes noted.    Musculoskeletal: exam reveals no obvious joint deformities.    Neurologically:  Mental status: The patient is awake, alert and oriented in all 4 spheres. Her immediate and remote memory, attention, language skills and fund of knowledge are appropriate. There is no evidence of aphasia, agnosia, apraxia or anomia. Speech is clear with normal prosody and enunciation. Thought process is linear. Mood is normal and affect is normal.  Cranial nerves II - XII are as described above under HEENT exam.  Motor exam: Normal bulk, and strength, minimal increase in tone in the right upper extremity.  She has a intermittent mild to moderate resting tremor in the right upper extremity, intermittent and very mild in the left upper extremity, she has a right foot resting tremor intermittently as well.  On fine motor testing she has mild difficulty with finger taps, hand movements and rapid alternating padding on the right, better on the left.  Foot taps are mildly impaired bilaterally.     (On 11/03/2021: On Archimedes spiral drawing she was minimal trembling with the left and mild trembling with the right.  Handwriting is legible, not tremulous, not micrographic.)     Cerebellar testing: No dysmetria or intention tremor. There is no truncal or gait ataxia.    Sensory exam: intact to light touch in the upper and lower extremities.  Gait, station and balance: She stands easily. No veering to one side is noted. No leaning to one side is noted. Posture is mildly stooped for age, she walks with good stride length and pace, no  shuffling, no freezing, no start hesitation, notable tremor in both upper extremities with walking.  Balance is preserved.    Assessment and Plan:    In summary, Teresa Logan is a very pleasant 57 year old female with an underlying medical history of hypertension, kidney stones, prediabetes, and obesity, who presents for evaluation of her Parkinson's disease.  History and examination support right-sided predominant Parkinson's disease.  She has been on Sinemet 1.5 pills 5 times a day but sometimes she sleeps after taking it.  She may sleep through some of these doses.  We talked about her sleep difficulty and the importance of keeping a sleep schedule.  If she decides to sleep during the day, she should take levodopa and pramipexole during her awake hours at night.  She also sleeps some at night and sleeps some during the day with interruptions.  She may be at risk for sleep apnea as well.  She has never had a sleep study.  She is advised to proceed with a nocturnal polysomnogram, we can switch her to a daytime sleep study if necessary and if she would prefer.  She is advised to use a cane for gait safety.  She is advised to continue with her current medications for Parkinson's disease.  We talked about the importance of diabetes control and she is advised to talk to her primary care about alternatives  to [place] as she does not wish to take it.  Her A1c was elevated about 7 months ago.  She will probably get it rechecked at the next visit.  She is advised to try to exercise on a regular basis and hydrate well.  We will call her with her sleep study results.  She is advised to follow-up routinely in this clinic to see Shanda Bumps in about 6 months and we may make another appointment in between after her sleep study.  I answered all her questions today and she was in agreement.  I spent 40 minutes in total face-to-face time and in reviewing records during pre-charting, more than 50% of which was spent  in counseling and coordination of care, reviewing test results, reviewing medications and treatment regimen and/or in discussing or reviewing the diagnosis of PD, sleep apnea risk, the prognosis and treatment options. Pertinent laboratory and imaging test results that were available during this visit with the patient were reviewed by me and considered in my medical decision making (see chart for details).

## 2023-03-17 NOTE — Patient Instructions (Addendum)
I would recommend that you work on your sleep schedule.  Sleeping some during the day and some at night erratically is going to be difficult for you to schedule your levodopa.  It does not do you much good when you take the medicine and go to sleep.   We will proceed with a sleep study during the day, we will try to schedule you in our sleep lab, you may be at risk for sleep apnea.  Significant sleep apnea, when it is moderate or severe, can cause increased risk of heart disease and stroke, dementia, difficulty with metabolism and diabetes control, difficulty losing weight.  Please use a cane for gait safety.  You can continue with Sinemet 1-1/2 pills 5 times a day but I recommend that you take it only when you plan to be awake. You can continue with pramipexole 0.5 mg 3 times a day.  Please follow-up in 6 months to see Shanda Bumps.  We may schedule a follow-up in between after your sleep study.  Please try to exercise on a regular basis.

## 2023-03-19 ENCOUNTER — Ambulatory Visit: Payer: 59

## 2023-03-22 ENCOUNTER — Ambulatory Visit: Payer: 59

## 2023-04-05 ENCOUNTER — Encounter: Payer: 59 | Admitting: Student

## 2023-04-14 ENCOUNTER — Ambulatory Visit: Payer: 59

## 2023-04-15 ENCOUNTER — Other Ambulatory Visit: Payer: Self-pay | Admitting: Anesthesiology

## 2023-04-15 DIAGNOSIS — G20A1 Parkinson's disease without dyskinesia, without mention of fluctuations: Secondary | ICD-10-CM

## 2023-04-15 MED ORDER — PRAMIPEXOLE DIHYDROCHLORIDE 0.5 MG PO TABS: 0.5000 mg | ORAL_TABLET | Freq: Three times a day (TID) | ORAL | 5 refills | Status: DC

## 2023-04-23 ENCOUNTER — Encounter: Payer: 59 | Admitting: Student

## 2023-04-23 ENCOUNTER — Ambulatory Visit: Payer: 59

## 2023-04-29 ENCOUNTER — Telehealth: Payer: Self-pay | Admitting: Neurology

## 2023-04-29 NOTE — Telephone Encounter (Signed)
04/20/23:[id number]  03/17/23 [employer] medicare/medicaid no auth req EE

## 2023-06-16 ENCOUNTER — Ambulatory Visit: Payer: 59

## 2023-06-16 ENCOUNTER — Encounter: Payer: 59 | Admitting: Student

## 2023-06-18 ENCOUNTER — Ambulatory Visit
Admission: RE | Admit: 2023-06-18 | Discharge: 2023-06-18 | Disposition: A | Discharge status: Home / Self Care | Payer: 59 | Source: Ambulatory Visit

## 2023-06-18 DIAGNOSIS — Z1231 Encounter for screening mammogram for malignant neoplasm of breast: Secondary | ICD-10-CM

## 2023-06-22 DIAGNOSIS — H9203 Otalgia, bilateral: Secondary | ICD-10-CM | POA: Diagnosis not present

## 2023-06-22 DIAGNOSIS — H6123 Impacted cerumen, bilateral: Secondary | ICD-10-CM | POA: Diagnosis not present

## 2023-08-05 ENCOUNTER — Encounter: Payer: Self-pay | Admitting: Adult Health

## 2023-08-05 ENCOUNTER — Telehealth: Payer: Self-pay | Admitting: Adult Health

## 2023-08-05 NOTE — Telephone Encounter (Signed)
Unable to LVM, VM box full. Sent letter in mail informing pt of need to reschedule 09/20/23 appt - NP out

## 2023-08-05 NOTE — Telephone Encounter (Signed)
Pt rescheduled appt on 08/31/23 at 2:45 pm

## 2023-08-31 ENCOUNTER — Ambulatory Visit (INDEPENDENT_AMBULATORY_CARE_PROVIDER_SITE_OTHER): Payer: 59 | Admitting: Student

## 2023-08-31 ENCOUNTER — Encounter: Payer: Self-pay | Admitting: Adult Health

## 2023-08-31 ENCOUNTER — Ambulatory Visit (INDEPENDENT_AMBULATORY_CARE_PROVIDER_SITE_OTHER): Payer: 59 | Admitting: Adult Health

## 2023-08-31 ENCOUNTER — Encounter: Payer: Self-pay | Admitting: Student

## 2023-08-31 ENCOUNTER — Ambulatory Visit: Payer: 59

## 2023-08-31 VITALS — BP 95/66 | HR 89 | Ht 66.0 in | Wt 235.8 lb

## 2023-08-31 VITALS — BP 139/91 | HR 92 | Temp 97.8°F | Ht 66.0 in | Wt 239.3 lb

## 2023-08-31 DIAGNOSIS — E785 Hyperlipidemia, unspecified: Secondary | ICD-10-CM

## 2023-08-31 DIAGNOSIS — Z0000 Encounter for general adult medical examination without abnormal findings: Secondary | ICD-10-CM | POA: Diagnosis not present

## 2023-08-31 DIAGNOSIS — G479 Sleep disorder, unspecified: Secondary | ICD-10-CM

## 2023-08-31 DIAGNOSIS — Z7985 Long-term (current) use of injectable non-insulin antidiabetic drugs: Secondary | ICD-10-CM | POA: Diagnosis not present

## 2023-08-31 DIAGNOSIS — Z23 Encounter for immunization: Secondary | ICD-10-CM

## 2023-08-31 DIAGNOSIS — G20A1 Parkinson's disease without dyskinesia, without mention of fluctuations: Secondary | ICD-10-CM

## 2023-08-31 DIAGNOSIS — Z7984 Long term (current) use of oral hypoglycemic drugs: Secondary | ICD-10-CM | POA: Diagnosis not present

## 2023-08-31 DIAGNOSIS — E119 Type 2 diabetes mellitus without complications: Secondary | ICD-10-CM | POA: Diagnosis not present

## 2023-08-31 DIAGNOSIS — I10 Essential (primary) hypertension: Secondary | ICD-10-CM | POA: Diagnosis not present

## 2023-08-31 LAB — POCT GLYCOSYLATED HEMOGLOBIN (HGB A1C): Hemoglobin A1C: 7 % — AB (ref 4.0–5.6)

## 2023-08-31 LAB — GLUCOSE, CAPILLARY: Glucose-Capillary: 104 mg/dL — ABNORMAL HIGH (ref 70–99)

## 2023-08-31 MED ORDER — CARBIDOPA-LEVODOPA 25-100 MG PO TABS: ORAL_TABLET | ORAL | 11 refills | Status: DC

## 2023-08-31 MED ORDER — ROSUVASTATIN CALCIUM 10 MG PO TABS: ORAL_TABLET | ORAL | 3 refills | Status: DC

## 2023-08-31 MED ORDER — PRAMIPEXOLE DIHYDROCHLORIDE 0.5 MG PO TABS: 0.5000 mg | ORAL_TABLET | Freq: Three times a day (TID) | ORAL | 3 refills | Status: DC

## 2023-08-31 MED ORDER — OLMESARTAN-AMLODIPINE-HCTZ 40-5-12.5 MG PO TABS: 1.0000 | ORAL_TABLET | Freq: Every day | ORAL | 2 refills | Status: AC

## 2023-08-31 NOTE — Progress Notes (Signed)
Subjective:   Teresa Logan is a 57 y.o. female who presents for Medicare Annual (Subsequent) preventive examination.  Visit Complete: In person  Patient Medicare AWV questionnaire was completed by the patient on 08/31/23; I have confirmed that all information answered by patient is correct and no changes since this date.        Objective:    Today's Vitals   08/31/23 1345 08/31/23 1346  BP: (!) 140/95 (!) 139/91  Pulse: 94 92  Temp: 97.8 F (36.6 C)   TempSrc: Oral   SpO2: 100%   Weight: 239 lb 4.8 oz (108.5 kg)   Height: 5\' 6"  (1.676 m)    Body mass index is 38.62 kg/m.     08/31/2023    1:47 PM 12/15/2022    4:33 AM 08/04/2022    9:06 AM 08/03/2022    3:02 PM 01/19/2022    1:25 PM 12/29/2021    2:00 PM 08/11/2021    1:10 PM  Advanced Directives  Does Patient Have a Medical Advance Directive? No No No No No No No  Would patient like information on creating a medical advance directive? No - Patient declined  No - Patient declined No - Patient declined No - Patient declined No - Patient declined     Current Medications (verified) Outpatient Encounter Medications as of 08/31/2023  Medication Sig   aspirin EC 81 MG tablet Take 1 tablet (81 mg total) by mouth daily.   carbidopa-levodopa (SINEMET IR) 25-100 MG tablet Take 1.5 tabs 5x daily   Olmesartan-amLODIPine-HCTZ 40-5-12.5 MG TABS Take 1 tablet by mouth daily.   pramipexole (MIRAPEX) 0.5 MG tablet Take 1 tablet (0.5 mg total) by mouth 3 (three) times daily.   rosuvastatin (CRESTOR) 10 MG tablet TAKE 1 TABLET(10 MG) BY MOUTH DAILY   No facility-administered encounter medications on file as of 08/31/2023.    Allergies (verified) Patient has no known allergies.   History: Past Medical History:  Diagnosis Date   Breast cancer screening    Encounter for hepatitis C screening test for low risk patient 08/28/2020   Encounter for screening for HIV 08/28/2020   HTN (hypertension)    Kidney stones    Need for  shingles vaccine 12/30/2021   Parkinson's disease (HCC)    Screening for colorectal cancer    Tremors of nervous system    Past Surgical History:  Procedure Laterality Date   TONSILLECTOMY     Family History  Problem Relation Age of Onset   Diabetes type II Mother    Alzheimer's disease Mother    Diabetes type II Father    Kidney disease Father    Healthy Son    Healthy Son    Healthy Son    Healthy Son    Social History   Socioeconomic History   Marital status: Single    Spouse name: Not on file   Number of children: Not on file   Years of education: Not on file   Highest education level: Not on file  Occupational History   Not on file  Tobacco Use   Smoking status: Never    Passive exposure: Yes   Smokeless tobacco: Never  Vaping Use   Vaping status: Never Used  Substance and Sexual Activity   Alcohol use: Yes    Alcohol/week: 2.0 standard drinks of alcohol    Types: 1 Glasses of wine, 1 Cans of beer per week    Comment: 1 drink per day, either wine or beer  Drug use: Never   Sexual activity: Yes  Other Topics Concern   Not on file  Social History Narrative   Right hand   One story home    Lives with domestic partner   Caffeine none .    Retired Engineer, civil (consulting) (traveler).  Retired 2019, disabled.     Social Determinants of Health   Financial Resource Strain: Low Risk  (08/31/2023)   Overall Financial Resource Strain (CARDIA)    Difficulty of Paying Living Expenses: Not hard at all  Food Insecurity: No Food Insecurity (08/31/2023)   Hunger Vital Sign    Worried About Running Out of Food in the Last Year: Never true    Ran Out of Food in the Last Year: Never true  Transportation Needs: No Transportation Needs (08/31/2023)   PRAPARE - Administrator, Civil Service (Medical): No    Lack of Transportation (Non-Medical): No  Physical Activity: Insufficiently Active (08/31/2023)   Exercise Vital Sign    Days of Exercise per Week: 5 days    Minutes of  Exercise per Session: 10 min  Stress: Stress Concern Present (08/31/2023)   Teresa [employer] Occupational Health - Occupational Stress Questionnaire    Feeling of Stress : To some extent  Social Connections: Moderately Isolated (08/31/2023)   Social Connection and Isolation Panel [NHANES]    Frequency of Communication with Friends and Family: More than three times a week    Frequency of Social Gatherings with Friends and Family: More than three times a week    Attends Religious Services: 1 to 4 times per year    Active Member of [employer] or Organizations: No    Attends Engineer, structural: Never    Marital Status: Divorced    Tobacco Counseling Counseling given: Not Answered   Clinical Intake:  Pre-visit preparation completed: Yes  Pain : No/denies pain     Nutritional Risks: None Diabetes: Yes CBG done?: No Did pt. bring in CBG monitor from home?: No  How often do you need to have someone help you when you read instructions, pamphlets, or other written materials from your doctor or pharmacy?: 1 - Never What is the last grade level you completed in school?: 16 years  Interpreter Needed?: No  Information entered by :: Genell Thede,cma   Activities of Daily Living    08/31/2023    1:47 PM  In your present state of health, do you have any difficulty performing the following activities:  Hearing? 0  Vision? 1  Difficulty concentrating or making decisions? 1  Walking or climbing stairs? 1  Dressing or bathing? 1  Doing errands, shopping? 0    Patient Care Team: Rana Snare, DO as PCP - General  Indicate any recent Medical Services you may have received from other than [facility] providers in the past year (date may be approximate).     Assessment:   This is a routine wellness examination for Teresa Logan.  Hearing/Vision screen No results found.   Goals Addressed   None   Depression Screen    08/31/2023    1:47 PM 08/04/2022    9:06 AM 08/03/2022     4:15 PM 12/29/2021    2:00 PM 05/06/2021    2:57 PM 08/27/2020    4:26 PM 04/29/2020    2:50 PM  PHQ 2/9 Scores  PHQ - 2 Score 0 0 0 0 0 1 2  PHQ- 9 Score  1 1 0  6 11    Fall Risk  08/31/2023    1:46 PM 08/31/2023    1:22 PM 08/04/2022    9:06 AM 08/03/2022    3:03 PM 01/19/2022    1:25 PM  Fall Risk   Falls in the past year? 0 0 0 0 0  Number falls in past yr: 0 0 0  0  Injury with Fall? 0 0 0  0  Risk for fall due to : No Fall Risks No Fall Risks No Fall Risks No Fall Risks   Follow up Falls evaluation completed;Falls prevention discussed Falls evaluation completed Falls evaluation completed;Falls prevention discussed Falls evaluation completed Falls evaluation completed    MEDICARE RISK AT HOME: Medicare Risk at Home Any stairs in or around the home?: Yes If so, are there any without handrails?: No Home free of loose throw rugs in walkways, pet beds, electrical cords, etc?: Yes Adequate lighting in your home to reduce risk of falls?: Yes Life alert?: No Use of a cane, walker or w/c?: No Grab bars in the bathroom?: Yes Shower chair or bench in shower?: Yes Elevated toilet seat or a handicapped toilet?: No  TIMED UP AND GO:  Was the test performed?  No    Cognitive Function:        08/31/2023    1:47 PM 08/04/2022    9:07 AM  6CIT Screen  What Year? 0 points 0 points  What month? 0 points 0 points  What time? 0 points 0 points  Count back from 20 0 points 0 points  Months in reverse 0 points 0 points  Repeat phrase 0 points 0 points  Total Score 0 points 0 points    Immunizations Immunization History  Administered Date(s) Administered   Influenza, Seasonal, Injecte, Preservative Fre 08/31/2023   Influenza,inj,Quad PF,6+ Mos 08/27/2020, 09/01/2021    TDAP status: Due, Education has been provided regarding the importance of this vaccine. Advised may receive this vaccine at local pharmacy or Health Dept. Aware to provide a copy of the vaccination record if  obtained from local pharmacy or Health Dept. Verbalized acceptance and understanding.  Flu Vaccine status: Up to date  Pneumococcal vaccine status: Due, Education has been provided regarding the importance of this vaccine. Advised may receive this vaccine at local pharmacy or Health Dept. Aware to provide a copy of the vaccination record if obtained from local pharmacy or Health Dept. Verbalized acceptance and understanding.  Covid-19 vaccine status: Information provided on how to obtain vaccines.   Qualifies for Shingles Vaccine? No   Zostavax completed No   Shingrix Completed?: No.    Education has been provided regarding the importance of this vaccine. Patient has been advised to call insurance company to determine out of pocket expense if they have not yet received this vaccine. Advised may also receive vaccine at local pharmacy or Health Dept. Verbalized acceptance and understanding.  Screening Tests Health Maintenance  Topic Date Due   DTaP/Tdap/Td (1 - Tdap) Never done   Zoster Vaccines- Shingrix (1 of 2) Never done   COLON CANCER SCREENING ANNUAL FOBT  12/30/2022   COVID-19 Vaccine (1 - 2023-24 season) Never done   Diabetic kidney evaluation - Urine ACR  08/04/2023   OPHTHALMOLOGY EXAM  08/15/2023   Diabetic kidney evaluation - eGFR measurement  12/15/2023   HEMOGLOBIN A1C  02/29/2024   FOOT EXAM  08/30/2024   Medicare Annual Wellness (AWV)  08/30/2024   MAMMOGRAM  06/17/2025   Cervical Cancer Screening (HPV/Pap Cotest)  01/20/2027   INFLUENZA VACCINE  Completed  Hepatitis C Screening  Completed   HIV Screening  Completed   HPV VACCINES  Aged Out   Colonoscopy  Discontinued    Health Maintenance  Health Maintenance Due  Topic Date Due   DTaP/Tdap/Td (1 - Tdap) Never done   Zoster Vaccines- Shingrix (1 of 2) Never done   COLON CANCER SCREENING ANNUAL FOBT  12/30/2022   COVID-19 Vaccine (1 - 2023-24 season) Never done   Diabetic kidney evaluation - Urine ACR   08/04/2023   OPHTHALMOLOGY EXAM  08/15/2023    Mammogram status: Completed 06/18/2023. Repeat every year:2   Lung Cancer Screening: (Low Dose CT Chest recommended if Age 81-80 years, 20 pack-year currently smoking OR have quit w/in 15years.) does not qualify.   Lung Cancer Screening Referral: N/A  Additional Screening:  Hepatitis C Screening: does not qualify; Completed 08/27/2020  Vision Screening: Recommended annual ophthalmology exams for early detection of glaucoma and other disorders of the eye. Is the patient up to date with their annual eye exam?  Yes  Who is the provider or what is the name of the office in which the patient attends annual eye exams? Dr.Groat If pt is not established with a provider, would they like to be referred to a provider to establish care? No .   Dental Screening: Recommended annual dental exams for proper oral hygiene  Diabetic Foot Exam: Diabetic Foot Exam: Completed 08/31/23  Community Resource Referral / Chronic Care Management: CRR required this visit?  No   CCM required this visit?  No     Plan:     I have personally reviewed and noted the following in the patient's chart:   Medical and social history Use of alcohol, tobacco or illicit drugs  Current medications and supplements including opioid prescriptions. Patient is not currently taking opioid prescriptions. Functional ability and status Nutritional status Physical activity Advanced directives List of other physicians Hospitalizations, surgeries, and ER visits in previous 12 months Vitals Screenings to include cognitive, depression, and falls Referrals and appointments  In addition, I have reviewed and discussed with patient certain preventive protocols, quality metrics, and best practice recommendations. A written personalized care plan for preventive services as well as general preventive health recommendations were provided to patient.     Cala Bradford, CMA   08/31/2023    After Visit Summary: (MyChart) Due to this being a telephonic visit, the after visit summary with patients personalized plan was offered to patient via MyChart   Nurse Notes: Face-To-Face Visit  Ms. Lakshmi Logan , Thank you for taking time to come for your Medicare Wellness Visit. I appreciate your ongoing commitment to your health goals. Please review the following plan we discussed and let me know if I can assist you in the future.   These are the goals we discussed:  Goals   None     This is a list of the screening recommended for you and due dates:  Health Maintenance  Topic Date Due   DTaP/Tdap/Td vaccine (1 - Tdap) Never done   Zoster (Shingles) Vaccine (1 of 2) Never done   Stool Blood Test  12/30/2022   COVID-19 Vaccine (1 - 2023-24 season) Never done   Yearly kidney health urinalysis for diabetes  08/04/2023   Eye exam for diabetics  08/15/2023   Yearly kidney function blood test for diabetes  12/15/2023   Hemoglobin A1C  02/29/2024   Complete foot exam   08/30/2024   Medicare Annual Wellness Visit  08/30/2024   Mammogram  06/17/2025   Pap with HPV screening  01/20/2027   Flu Shot  Completed   Hepatitis C Screening  Completed   HIV Screening  Completed   HPV Vaccine  Aged Out   Colon Cancer Screening  Discontinued

## 2023-08-31 NOTE — Patient Instructions (Addendum)
Your Plan:  Continue Sinemet and pramipexole at current dosages  Continue to stay active as tolerated with routine activity   You will be called to schedule a sleep study - please watch for this call - please call office if you do not hear from anyone by end of next week      Follow up in 6 months or call earlier if needed      Thank you for coming to see [place] at [facility]. I hope we have been able to provide you high quality care today.  You may receive a patient satisfaction survey over the next few weeks. We would appreciate your feedback and comments so that we may continue to improve ourselves and the health of our patients.

## 2023-08-31 NOTE — Progress Notes (Signed)
[facility] [address] [place]. [place] [address] [phone]       OFFICE FOLLOW UP NOTE  [name] Date of Birth:  December 11, 1965 Medical Record Number:  [id number]    Primary neurologist: Dr. Frances Furbish Reason for visit: Parkinson's disease    SUBJECTIVE:   CHIEF COMPLAINT:  Chief Complaint  Patient presents with   Follow-up    Pt in 8 alone  Pt here for parkinson f/u Pt states tremors are same  Pt states RLS     HPI:    Update 08/31/2023 JM: Patient returns for follow-up unaccompanied.  Stable since prior visit.  Remains on Sinemet and pramipexole, denies side effects.  Continues to have irregular sleep patterns from longstanding history of working night shifts.  She has not yet been scheduled for sleep study as previously recommended by Dr. Frances Furbish.  Tries to stay active with routine physical activity, ambulates without AD, no recent falls.  No questions or concerns at this time.     History provided for reference purposes only Update 03/17/2023 Dr. Frances Furbish: She reports doing fairly.  She has significant difficulty with her sleep and erratic sleep.  She used to work nights and often sleeps during the day, may go to bed around 10 AM and sleep till 4 or 5 AM but sleep is interrupted, she takes her blood pressure medication, goes to the bathroom, takes her Sinemet and goes back to sleep.  She does not sleep well at night, she had tried trazodone in the past.  She snores, she never had a sleep study.  Her Epworth sleepiness score is 12 out of 24, fatigue severity score is 59 out of 63.  Her primary care prescribed Jardiance at the last visit but she is not taking it, she decided against it but did not notify her primary care.  Her A1c in November 2023 was 7.3 and had gone up compared to about 7 months prior.  She has a follow-up appointment this week.  She has not been exercising regularly.  She used to go to the gym up to 3 times a week.  She has not  fallen in the recent past since the last visit.  She drinks no daily caffeine, occasional soda.  She drinks alcohol occasionally.  She does not smoke cigarettes but reports that she has smokers in the house who smoke inside the house.   Update 07/13/2022 JM: Patient is being seen for 65-month follow-up regarding Parkinson's disease unaccompanied.  After prior visit, Dr. Frances Furbish recommended changing Sinemet dosage to 1.5 pills 5 times daily (6am, 10am, 2pm, 6pm and 10pm) and continue pramipexole 1 pills TID.  She reports currently taking Sinemet 1.5 mg tablets at 11a, 4p, 9p, 2a and 6a.  Reports her sleeping pattern is erratic, will not take up specifically at 2 AM to take medication but if she is up at that time, she will take it.  Currently taking pramipexole at 11a, 7p and 3a.  Reports having good days and bad days, overall stable since prior visit.  Reports gait has been stable, denies any recent falls.  Update 03/10/2022 JM: Patient returns for follow-up visit after prior initial consult visit with Dr. Frances Furbish 4 months ago.  She is unaccompanied.  She has been stable since prior visit.  Currently taking Sinemet 25/100 2 pills 6am, 2 pills 10am and 1 pill 2pm as well as pramipexole 0.5 mg 3 times daily.  Tolerates medications well without side effects.  Reports around  6 PM, she experiences worsening in her tremors.  She typically goes to bed around midnight.  She does note worsening of her tremors prior to her next dose. She denies taking pills with meals or around meal time. Is requesting Sinemet dosage be increased.  Does continue to have occasional constipation, not currently using any type of laxative.  She does admit to continued limited water intake.  She does have concerns regarding weight gain.  Gait has been stable, does not use assistive device, denies any recent falls.  No further concerns at this time.  Consult visit 11/03/2021 Dr. Frances Furbish: Teresa Logan is a 57 year old right-handed woman  with an underlying medical history of hypertension, kidney stones, prediabetes, and obesity, who was previously diagnosed with Parkinson's disease.  She had follow-up with Dr. Lurena Joiner until May 2022, and started seeing Dr. Arbutus Leas in 04/21.  Prior to that, patient was followed by Dr. April Manson, [place] where patient previously lived.  Patient reports symptoms dating back to 2013 when she first started having of right foot tremor.  She had a DaTscan in or around 2014 which supported Parkinson's disease.  She was lost to follow-up until 2018 or 19 and started treatment with levodopa with her previous neurologist.  She feels that the medication has been helpful.  She currently takes levodopa 2 pills in the morning, 2 pills midday and 1 pill in the afternoon along with pramipexole 0.5 mg 3 times daily.  I reviewed Dr. Don Perking office notes from 02/20/2021, as well as 01/15/2020.  She has had some weight gain.  She was advised to continue with generic Sinemet 25-100 mg strength as well as pramipexole 0.5 mg strength 1 pill 3 times daily.  She has been on pramipexole since November 2021. She has had intermittent constipation, has a bowel movement every other day approximately.  She does not always hydrate well and estimates that she drinks about 1 bottle of water per day on average.  She denies a family history of Parkinson's disease.  She has had 1 fall in 2022.  She reports that she fell in the bathroom at her daughter's house in [place].  She does not typically use a cane or walker.  She has had therapy.  She drinks alcohol daily in the form of 1 glass of wine or 1 beer.  She does not drink caffeine daily.  She does not have any records from her neurologist in [place].  She does not know where they are.  A DaTscan report was not available for my review today but some records were not available apparently when patient saw Dr. Arbutus Leas initially.      ROS:   14 system review of systems performed and negative with  exception of those listed in HPI  PMH:  Past Medical History:  Diagnosis Date   Breast cancer screening    Encounter for hepatitis C screening test for low risk patient 08/28/2020   Encounter for screening for HIV 08/28/2020   HTN (hypertension)    Kidney stones    Need for shingles vaccine 12/30/2021   Parkinson's disease (HCC)    Screening for colorectal cancer    Tremors of nervous system     PSH:  Past Surgical History:  Procedure Laterality Date   TONSILLECTOMY      Social History:  Social History   Socioeconomic History   Marital status: Single    Spouse name: Not on file   Number of children: Not on file  Years of education: Not on file   Highest education level: Not on file  Occupational History   Not on file  Tobacco Use   Smoking status: Never    Passive exposure: Yes   Smokeless tobacco: Never  Vaping Use   Vaping status: Never Used  Substance and Sexual Activity   Alcohol use: Yes    Alcohol/week: 9.0 standard drinks of alcohol    Types: 3 Glasses of wine, 3 Cans of beer, 3 Shots of liquor per week    Comment: 1 drink per day, either wine or beer   Drug use: Never   Sexual activity: Yes  Other Topics Concern   Not on file  Social History Narrative   Right hand   One story home    Lives with domestic partner   Caffeine none .    Retired Engineer, civil (consulting) (traveler).  Retired 2019, disabled.     Social Determinants of Health   Financial Resource Strain: Low Risk  (08/31/2023)   Overall Financial Resource Strain (CARDIA)    Difficulty of Paying Living Expenses: Not hard at all  Food Insecurity: No Food Insecurity (08/31/2023)   Hunger Vital Sign    Worried About Running Out of Food in the Last Year: Never true    Ran Out of Food in the Last Year: Never true  Transportation Needs: No Transportation Needs (08/31/2023)   PRAPARE - Administrator, Civil Service (Medical): No    Lack of Transportation (Non-Medical): No  Physical Activity:  Insufficiently Active (08/31/2023)   Exercise Vital Sign    Days of Exercise per Week: 5 days    Minutes of Exercise per Session: 10 min  Stress: Stress Concern Present (08/31/2023)   [employer] of Occupational Health - Occupational Stress Questionnaire    Feeling of Stress : To some extent  Social Connections: Moderately Isolated (08/31/2023)   Social Connection and Isolation Panel [NHANES]    Frequency of Communication with Friends and Family: More than three times a week    Frequency of Social Gatherings with Friends and Family: More than three times a week    Attends Religious Services: 1 to 4 times per year    Active Member of [employer] or Organizations: No    Attends Banker Meetings: Never    Marital Status: Divorced  Catering manager Violence: Not At Risk (08/31/2023)   Humiliation, Afraid, Rape, and Kick questionnaire    Fear of Current or Ex-Partner: No    Emotionally Abused: No    Physically Abused: No    Sexually Abused: No    Family History:  Family History  Problem Relation Age of Onset   Diabetes type II Mother    Alzheimer's disease Mother    Diabetes type II Father    Kidney disease Father    Healthy Son    Healthy Son    Healthy Son    Healthy Son    Parkinson's disease Neg Hx     Medications:   Current Outpatient Medications on File Prior to Visit  Medication Sig Dispense Refill   aspirin EC 81 MG tablet Take 1 tablet (81 mg total) by mouth daily. 90 tablet 3   carbidopa-levodopa (SINEMET IR) 25-100 MG tablet Take 1.5 tabs 5x daily 225 tablet 5   pramipexole (MIRAPEX) 0.5 MG tablet Take 1 tablet (0.5 mg total) by mouth 3 (three) times daily. 90 tablet 5   No current facility-administered medications on file prior to visit.  Allergies:  No Known Allergies    OBJECTIVE:  Physical Exam  Vitals:   08/31/23 1444  BP: 95/66  Pulse: 89  Weight: 235 lb 12.8 oz (107 kg)  Height: 5\' 6"  (1.676 m)   Body mass index is 38.06 kg/m. No  results found.   General: Morbidly obese very pleasant middle-aged African-American female, seated, in no evident distress  Neurologic Exam Mental Status: Awake and fully alert.  Fluent speech and language.  Oriented to place and time. Recent and remote memory intact. Attention span, concentration and fund of knowledge appropriate. Mood and affect appropriate.  Cranial Nerves: Pupils equal, briskly reactive to light. Extraocular movements full without nystagmus. Visual fields full to confrontation. Hearing intact. Facial sensation intact. Face, tongue, palate moves normally and symmetrically.  Motor: Normal strength in all tested extremity muscles.  Slightly increased tone RUE, right cogwheel rigidity. Mild intermittent R>L resting tremor.  Intermittent right foot tremor.  Unable to appreciate intention tremor Sensory.: intact to touch , pinprick , position and vibratory sensation.  Coordination: Rapid alternating movements good in upper extremities, slightly decreased foot taps bilaterally. Finger-to-nose and heel-to-shin performed accurately bilaterally. Gait and Station: Arises from chair without difficulty. Stance is mildly stooped. Gait demonstrates normal stride length and balance without use of AD.  No evidence of shuffling or freezing.  Mild tremor in both upper extremities when walking. Reflexes: 1+ and symmetric. Toes downgoing.         ASSESSMENT/PLAN: Tamesa Dallis is a 57 y.o. year old female   Right-sided predominant Parkinson's disease -Continue Sinemet 25/100  1.5 tabs 5 times daily-refill provided -Continue Mirapex 0.5 mg 3 times daily-refill provided -Discussed importance of routine physical activity and exercise as tolerated  Sleep disturbance -Schedule sleep study as previously recommended by Dr. Frances Furbish - will reach out to sleep clinic to further assist     Follow up in 6 months or call earlier if needed    CC:  PCP: Rana Snare, DO    I spent  25 minutes of face-to-face and non-face-to-face time with patient.  This included previsit chart review, lab review, study review, order entry, electronic health record documentation, patient education regarding above diagnoses and treatment plan and answered all other questions to patient satisfaction  Ihor Austin, [facility]  [facility] [address] [place], [place] [address]  Phone [phone] Fax [phone] Note: This document was prepared with digital dictation and possible smart phrase technology. Any transcriptional errors that result from this process are unintentional.

## 2023-08-31 NOTE — Progress Notes (Signed)
CC: Annual wellness visit  HPI:  [name] is a 57 y.o. female living with a history stated below and presents today for annual wellness visit and also discuss other chronic conditions. Please see problem based assessment and plan for additional details.  Past Medical History:  Diagnosis Date   Breast cancer screening    Encounter for hepatitis C screening test for low risk patient 08/28/2020   Encounter for screening for HIV 08/28/2020   HTN (hypertension)    Kidney stones    Need for shingles vaccine 12/30/2021   Parkinson's disease    Screening for colorectal cancer    Tremors of nervous system     Current Outpatient Medications on File Prior to Visit  Medication Sig Dispense Refill   aspirin EC 81 MG tablet Take 1 tablet (81 mg total) by mouth daily. 90 tablet 3   carbidopa-levodopa (SINEMET IR) 25-100 MG tablet Take 1.5 tabs 5x daily 225 tablet 5   pramipexole (MIRAPEX) 0.5 MG tablet Take 1 tablet (0.5 mg total) by mouth 3 (three) times daily. 90 tablet 5   No current facility-administered medications on file prior to visit.    Family History  Problem Relation Age of Onset   Diabetes type II Mother    Alzheimer's disease Mother    Diabetes type II Father    Kidney disease Father    Healthy Son    Healthy Son    Healthy Son    Healthy Son     Social History   Socioeconomic History   Marital status: Single    Spouse name: Not on file   Number of children: Not on file   Years of education: Not on file   Highest education level: Not on file  Occupational History   Not on file  Tobacco Use   Smoking status: Never    Passive exposure: Yes   Smokeless tobacco: Never  Vaping Use   Vaping status: Never Used  Substance and Sexual Activity   Alcohol use: Yes    Alcohol/week: 2.0 standard drinks of alcohol    Types: 1 Glasses of wine, 1 Cans of beer per week    Comment: 1 drink per day, either wine or beer   Drug use: Never   Sexual activity:  Yes  Other Topics Concern   Not on file  Social History Narrative   Right hand   One story home    Lives with domestic partner   Caffeine none .    Retired Engineer, civil (consulting) (traveler).  Retired 2019, disabled.     Social Determinants of Health   Financial Resource Strain: Low Risk  (08/04/2022)   Overall Financial Resource Strain (CARDIA)    Difficulty of Paying Living Expenses: Not hard at all  Food Insecurity: No Food Insecurity (08/04/2022)   Hunger Vital Sign    Worried About Running Out of Food in the Last Year: Never true    Ran Out of Food in the Last Year: Never true  Transportation Needs: No Transportation Needs (08/04/2022)   PRAPARE - Administrator, Civil Service (Medical): No    Lack of Transportation (Non-Medical): No  Physical Activity: Insufficiently Active (08/04/2022)   Exercise Vital Sign    Days of Exercise per Week: 1 day    Minutes of Exercise per Session: 10 min  Stress: No Stress Concern Present (08/04/2022)   [employer] of Occupational Health - Occupational Stress Questionnaire    Feeling of Stress : Only  a little  Social Connections: Socially Integrated (08/04/2022)   Social Connection and Isolation Panel [NHANES]    Frequency of Communication with Friends and Family: More than three times a week    Frequency of Social Gatherings with Friends and Family: More than three times a week    Attends Religious Services: 1 to 4 times per year    Active Member of [employer] or Organizations: No    Attends Banker Meetings: 1 to 4 times per year    Marital Status: Living with partner  Intimate Partner Violence: Not At Risk (08/04/2022)   Humiliation, Afraid, Rape, and Kick questionnaire    Fear of Current or Ex-Partner: No    Emotionally Abused: No    Physically Abused: No    Sexually Abused: No    Review of Systems: ROS negative except for what is noted on the assessment and plan.  Vitals:   08/31/23 1051 08/31/23 1129  BP: (!) 140/95 (!)  139/91  Pulse: 94 92  Temp: 97.8 F (36.6 C)   TempSrc: Oral   SpO2: 100%   Weight: 239 lb 4.8 oz (108.5 kg)   Height: 5\' 6"  (1.676 m)     Physical Exam: Constitutional: Well-appearing woman, sitting in chair, in no acute distress Cardiovascular: regular rate and rhythm, no m/r/g Pulmonary/Chest: normal work of breathing on room air, lungs clear to auscultation bilaterally Abdominal: soft, non-tender, non-distended MSK: normal bulk and tone Neurological: Resting tremors noted in both upper and lower extremities ,alert & oriented x 3, no focal deficit Skin: warm and dry Psych: Normal mood and affect  Assessment & Plan:   No problem-specific Assessment & Plan notes found for this encounter.  Encounter for Medicare annual wellness exam This is a 57 year old female presenting to the office for her annual wellness visit.  Patient is up-to-date with mammogram.  Colonoscopy discussed with the patient as well as shingles vaccine. She is amenable to getting the flu shot today.  Her other chronic conditions discussed below. - Give  flu shot  Type 2 diabetes Has a history of type 2 diabetes currently on no medication.  Was prescribed Ozempic and Jardiance insulin 23 but she never took them.  Patient is only amenable to lifestyle modifications and dietary changes to control her diabetes at this time.  Her hemoglobin A1c is 7 from 7.31-year ago.  Because patient is now amenable to starting any antidiabetic regimen at this time, I would consult the diabetes education coordinator to help with dietary changes and lifestyle modification -Consult to diabetes coordinator.  Hypertension Patient takes olmesartan-amlodipine-HCTZ combo for hypertension.  Blood pressure in the office today is 139/91.  Patient says she did not take her medication before coming to the office today. -Recheck BMP -Recheck CBC -Continue olmesartan-amlodipine-HCTZ -Follow-up in 6 months -Urine albumin creatinine  ratio  Hyperlipidemia Patient takes Crestor 10 mg for hyperlipidemia.  Last lipid profile was in November 2023.  Will recheck her lipid profile again today and adjust her medication if needed - Continue Crestor 10 mg - Order lipid profile  Patient discussed with Dr. Ginger Carne, M.D [facility] Phone: [phone] Date 08/31/2023 Time 11:42 AM

## 2023-08-31 NOTE — Patient Instructions (Signed)
Thank you, Teresa Logan for allowing [place] to provide your care today. Today we discussed your general health.  -I refilled your blood pressure medications -Your mammogram results came back and it looks normal there is no concerns for breast cancer at this time -I will recheck your cholesterol levels and also order blood work and see how your organs are doing -Continue to follow-up with your neurologist for your Parkinson's and take your medications as prescribed -Your hemoglobin A1c is at 7 today.  I would recommend that you start a medication for your blood sugars at this time.  If he is still hesitant to start medication for diabetes at this time I would set you up with an appointment to speak to our diabetes education coordinator here in the clinic to help you create a meal plan and exercise regimen to help control blood sugars.  I have ordered the following labs for you:  Lab Orders         Urine Albumin/Creatinine with ratio (send out) [LAB689]         Glucose, capillary         CBC no Diff         CMP14 + Anion Gap         Lipid Profile         POC Hbg A1C      Tests ordered today:    Referrals ordered today:   Referral Orders  No referral(s) requested today     I have ordered the following medication/changed the following medications:   Stop the following medications: Medications Discontinued During This Encounter  Medication Reason   rosuvastatin (CRESTOR) 10 MG tablet Reorder   Olmesartan-amLODIPine-HCTZ 40-5-12.5 MG TABS Reorder     Start the following medications: Meds ordered this encounter  Medications   rosuvastatin (CRESTOR) 10 MG tablet    Sig: TAKE 1 TABLET(10 MG) BY MOUTH DAILY    Dispense:  90 tablet    Refill:  3   Olmesartan-amLODIPine-HCTZ 40-5-12.5 MG TABS    Sig: Take 1 tablet by mouth daily.    Dispense:  90 tablet    Refill:  2     Follow up: 3 months   Remember:   Should you have any questions or concerns please call the  internal medicine clinic at [phone].    Kathleen Lime, M.D [facility]

## 2023-09-01 LAB — CBC
Hematocrit: 45.9 % (ref 34.0–46.6)
Hemoglobin: 14.6 g/dL (ref 11.1–15.9)
MCH: 28.2 pg (ref 26.6–33.0)
MCHC: 31.8 g/dL (ref 31.5–35.7)
MCV: 89 fL (ref 79–97)
Platelets: 339 10*3/uL (ref 150–450)
RBC: 5.18 x10E6/uL (ref 3.77–5.28)
RDW: 13.3 % (ref 11.7–15.4)
WBC: 9.7 10*3/uL (ref 3.4–10.8)

## 2023-09-01 LAB — CMP14 + ANION GAP
ALT: 8 [IU]/L (ref 0–32)
AST: 12 [IU]/L (ref 0–40)
Albumin: 4.2 g/dL (ref 3.8–4.9)
Alkaline Phosphatase: 101 [IU]/L (ref 44–121)
Anion Gap: 17 mmol/L (ref 10.0–18.0)
BUN/Creatinine Ratio: 20 (ref 9–23)
BUN: 14 mg/dL (ref 6–24)
Bilirubin Total: 0.2 mg/dL (ref 0.0–1.2)
CO2: 25 mmol/L (ref 20–29)
Calcium: 9.5 mg/dL (ref 8.7–10.2)
Chloride: 100 mmol/L (ref 96–106)
Creatinine, Ser: 0.71 mg/dL (ref 0.57–1.00)
Globulin, Total: 3.2 g/dL (ref 1.5–4.5)
Glucose: 106 mg/dL — ABNORMAL HIGH (ref 70–99)
Potassium: 4.6 mmol/L (ref 3.5–5.2)
Sodium: 142 mmol/L (ref 134–144)
Total Protein: 7.4 g/dL (ref 6.0–8.5)
eGFR: 98 mL/min/{1.73_m2} (ref >59)

## 2023-09-01 LAB — LIPID PANEL
Chol/HDL Ratio: 4.7 {ratio} — ABNORMAL HIGH (ref 0.0–4.4)
Cholesterol, Total: 179 mg/dL (ref 100–199)
HDL: 38 mg/dL — ABNORMAL LOW (ref >39)
LDL Chol Calc (NIH): 99 mg/dL (ref 0–99)
Triglycerides: 248 mg/dL — ABNORMAL HIGH (ref 0–149)
VLDL Cholesterol Cal: 42 mg/dL — ABNORMAL HIGH (ref 5–40)

## 2023-09-02 LAB — MICROALBUMIN / CREATININE URINE RATIO
Creatinine, Urine: 118.2 mg/dL
Microalb/Creat Ratio: 33 mg/g{creat} — ABNORMAL HIGH (ref 0–29)
Microalbumin, Urine: 39 ug/mL

## 2023-09-07 NOTE — Progress Notes (Signed)
Internal Medicine Clinic Attending  Case discussed with the resident at the time of the visit.  We reviewed the resident's history and exam and pertinent patient test results.  I agree with the assessment, diagnosis, and plan of care documented in the resident's note.  

## 2023-09-07 NOTE — Addendum Note (Signed)
Addended by: Burnell Blanks on: 09/07/2023 02:09 PM   Modules accepted: Level of Service

## 2023-09-09 NOTE — Progress Notes (Signed)
Internal Medicine Clinic Attending  Case, documentation, and findings reviewed. I agree with the assessment, diagnosis, and plan of care as outlined in the AWV note.     

## 2023-09-20 ENCOUNTER — Ambulatory Visit: Payer: 59 | Admitting: Adult Health

## 2023-12-13 ENCOUNTER — Other Ambulatory Visit: Payer: Self-pay | Admitting: *Deleted

## 2023-12-13 DIAGNOSIS — G20A1 Parkinson's disease without dyskinesia, without mention of fluctuations: Secondary | ICD-10-CM

## 2023-12-13 MED ORDER — PRAMIPEXOLE DIHYDROCHLORIDE 0.5 MG PO TABS: 0.5000 mg | ORAL_TABLET | Freq: Three times a day (TID) | ORAL | 0 refills | Status: DC

## 2023-12-13 NOTE — Telephone Encounter (Signed)
 Last seen on 08/31/23 Follow up scheduled on 03/14/24

## 2023-12-22 ENCOUNTER — Encounter: Payer: Self-pay | Admitting: Student

## 2024-01-11 ENCOUNTER — Encounter: Admitting: Student

## 2024-02-11 ENCOUNTER — Ambulatory Visit: Payer: Self-pay | Admitting: Student

## 2024-03-14 ENCOUNTER — Ambulatory Visit (INDEPENDENT_AMBULATORY_CARE_PROVIDER_SITE_OTHER): Payer: 59 | Admitting: Adult Health

## 2024-03-14 ENCOUNTER — Encounter: Payer: Self-pay | Admitting: Adult Health

## 2024-03-14 ENCOUNTER — Ambulatory Visit: Payer: Self-pay | Admitting: Student

## 2024-03-14 VITALS — BP 120/70 | HR 80 | Ht 66.0 in | Wt 236.8 lb

## 2024-03-14 VITALS — BP 144/87 | HR 82 | Temp 98.3°F | Ht 66.0 in | Wt 236.6 lb

## 2024-03-14 DIAGNOSIS — R319 Hematuria, unspecified: Secondary | ICD-10-CM

## 2024-03-14 DIAGNOSIS — Z0000 Encounter for general adult medical examination without abnormal findings: Secondary | ICD-10-CM

## 2024-03-14 DIAGNOSIS — Z1211 Encounter for screening for malignant neoplasm of colon: Secondary | ICD-10-CM

## 2024-03-14 DIAGNOSIS — E119 Type 2 diabetes mellitus without complications: Secondary | ICD-10-CM | POA: Diagnosis not present

## 2024-03-14 DIAGNOSIS — G20A2 Parkinson's disease without dyskinesia, with fluctuations: Secondary | ICD-10-CM | POA: Diagnosis not present

## 2024-03-14 DIAGNOSIS — I10 Essential (primary) hypertension: Secondary | ICD-10-CM

## 2024-03-14 DIAGNOSIS — E785 Hyperlipidemia, unspecified: Secondary | ICD-10-CM | POA: Diagnosis not present

## 2024-03-14 DIAGNOSIS — Z1231 Encounter for screening mammogram for malignant neoplasm of breast: Secondary | ICD-10-CM

## 2024-03-14 LAB — POCT URINALYSIS DIPSTICK
Bilirubin, UA: NEGATIVE
Glucose, UA: NEGATIVE
Ketones, UA: NEGATIVE
Nitrite, UA: NEGATIVE
Protein, UA: NEGATIVE
Spec Grav, UA: 1.02 (ref 1.010–1.025)
Urobilinogen, UA: 0.2 U/dL
pH, UA: 6.5 (ref 5.0–8.0)

## 2024-03-14 LAB — POCT GLYCOSYLATED HEMOGLOBIN (HGB A1C): Hemoglobin A1C: 7 % — AB (ref 4.0–5.6)

## 2024-03-14 LAB — GLUCOSE, CAPILLARY: Glucose-Capillary: 124 mg/dL — ABNORMAL HIGH (ref 70–99)

## 2024-03-14 MED ORDER — CARBIDOPA-LEVODOPA 25-100 MG PO TABS: ORAL_TABLET | ORAL | 11 refills | Status: DC

## 2024-03-14 MED ORDER — PRAMIPEXOLE DIHYDROCHLORIDE 0.5 MG PO TABS: 0.5000 mg | ORAL_TABLET | Freq: Three times a day (TID) | ORAL | 3 refills | Status: DC

## 2024-03-14 NOTE — Progress Notes (Signed)
 [facility] [address] [place]. [place] [address] [phone]       OFFICE FOLLOW UP NOTE  [name] Date of Birth:  1965-10-05 Medical Record Number:  [id number]    Primary neurologist: Dr. Omar Bibber Reason for visit: Parkinson's disease    SUBJECTIVE:   CHIEF COMPLAINT:  Chief Complaint  Patient presents with   Follow-up    RM 3, Pt alone. States no problems since last visit. Reports not resting well. Pt states she wants to try medicine to help with tremors and to relax. Reports sometimes she is anxious without a reason.    HPI:   Update 03/14/2024 JM: Patient returns for follow-up visit unaccompanied.  Reports overall stable since prior visit. Remains on Sinemet  1.5 tab 5 day (11a, 4p, 9p, 2a, 6a) and Pramipexole  1 tab TID (11a, 4p, 9p). Tolerates medications well. Can occasionally have worsening resting tremor in between Sinemet  dosages.  Denies any gait difficulty, ambulates without AD, no recent falls.  Previously discussed pursuing sleep study but declines interest at this time.  She tries to stay active with routine physical activity.  Routinely follows with PCP.  She does question use of medicinal marijuana to further help with tremor.     History provided for reference purposes only Update 08/31/2023 JM: Patient returns for follow-up unaccompanied.  Stable since prior visit.  Remains on Sinemet  and pramipexole , denies side effects.  Continues to have irregular sleep patterns from longstanding history of working night shifts.  She has not yet been scheduled for sleep study as previously recommended by Dr. Omar Bibber.  Tries to stay active with routine physical activity, ambulates without AD, no recent falls.  No questions or concerns at this time.  Update 03/17/2023 Dr. Omar Bibber: She reports doing fairly.  She has significant difficulty with her sleep and erratic sleep.  She used to work nights and often sleeps during the day, may go to bed  around 10 AM and sleep till 4 or 5 AM but sleep is interrupted, she takes her blood pressure medication, goes to the bathroom, takes her Sinemet  and goes back to sleep.  She does not sleep well at night, she had tried trazodone in the past.  She snores, she never had a sleep study.  Her Epworth sleepiness score is 12 out of 24, fatigue severity score is 59 out of 63.  Her primary care prescribed Jardiance  at the last visit but she is not taking it, she decided against it but did not notify her primary care.  Her A1c in November 2023 was 7.3 and had gone up compared to about 7 months prior.  She has a follow-up appointment this week.  She has not been exercising regularly.  She used to go to the gym up to 3 times a week.  She has not fallen in the recent past since the last visit.  She drinks no daily caffeine, occasional soda.  She drinks alcohol occasionally.  She does not smoke cigarettes but reports that she has smokers in the house who smoke inside the house.   Update 07/13/2022 JM: Patient is being seen for 59-month follow-up regarding Parkinson's disease unaccompanied.  After prior visit, Dr. Omar Bibber recommended changing Sinemet  dosage to 1.5 pills 5 times daily (6am, 10am, 2pm, 6pm and 10pm) and continue pramipexole  1 pills TID.  She reports currently taking Sinemet  1.5 mg tablets at 11a, 4p, 9p, 2a and 6a.  Reports her sleeping pattern is erratic, will not take up specifically  at 2 AM to take medication but if she is up at that time, she will take it.  Currently taking pramipexole  at 11a, 7p and 3a.  Reports having good days and bad days, overall stable since prior visit.  Reports gait has been stable, denies any recent falls.  Update 03/10/2022 JM: Patient returns for follow-up visit after prior initial consult visit with Dr. Omar Bibber 4 months ago.  She is unaccompanied.  She has been stable since prior visit.  Currently taking Sinemet  25/100 2 pills 6am, 2 pills 10am and 1 pill 2pm as well as pramipexole  0.5  mg 3 times daily.  Tolerates medications well without side effects.  Reports around 6 PM, she experiences worsening in her tremors.  She typically goes to bed around midnight.  She does note worsening of her tremors prior to her next dose. She denies taking pills with meals or around meal time. Is requesting Sinemet  dosage be increased.  Does continue to have occasional constipation, not currently using any type of laxative.  She does admit to continued limited water intake.  She does have concerns regarding weight gain.  Gait has been stable, does not use assistive device, denies any recent falls.  No further concerns at this time.  Consult visit 11/03/2021 Dr. Omar Bibber: [name] is a 58 year old right-handed woman with an underlying medical history of hypertension, kidney stones, prediabetes, and obesity, who was previously diagnosed with Parkinson's disease.  She had follow-up with Dr. Ivette Marks until May 2022, and started seeing Dr. Winferd Hatter in 04/21.  Prior to that, patient was followed by Dr. Georgeanna Kinds [facility], [place]  where patient previously lived.  Patient reports symptoms dating back to 2013 when she first started having of right foot tremor.  She had a DaTscan in or around 2014 which supported Parkinson's disease.  She was lost to follow-up until 2018 or 19 and started treatment with levodopa  with her previous neurologist.  She feels that the medication has been helpful.  She currently takes levodopa  2 pills in the morning, 2 pills midday and 1 pill in the afternoon along with pramipexole  0.5 mg 3 times daily.  I reviewed Dr. Jamel Mc office notes from 02/20/2021, as well as 01/15/2020.  She has had some weight gain.  She was advised to continue with generic Sinemet  25-100 mg strength as well as pramipexole  0.5 mg strength 1 pill 3 times daily.  She has been on pramipexole  since November 2021. She has had intermittent constipation, has a bowel movement every other day approximately.  She does not  always hydrate well and estimates that she drinks about 1 bottle of water per day on average.  She denies a family history of Parkinson's disease.  She has had 1 fall in 2022.  She reports that she fell in the bathroom at her daughter's house in [place].  She does not typically use a cane or walker.  She has had therapy.  She drinks alcohol daily in the form of 1 glass of wine or 1 beer.  She does not drink caffeine daily.  She does not have any records from her neurologist in [place] .  She does not know where they are.  A DaTscan report was not available for my review today but some records were not available apparently when patient saw Dr. Winferd Hatter initially.      ROS:   14 system review of systems performed and negative with exception of those listed in HPI  PMH:  Past Medical History:  Diagnosis  Date   Breast cancer screening    Encounter for hepatitis C screening test for low risk patient 08/28/2020   Encounter for screening for HIV 08/28/2020   HTN (hypertension)    Kidney stones    Need for shingles vaccine 12/30/2021   Parkinson's disease (HCC)    Screening for colorectal cancer    Tremors of nervous system     PSH:  Past Surgical History:  Procedure Laterality Date   TONSILLECTOMY      Social History:  Social History   Socioeconomic History   Marital status: Single    Spouse name: Not on file   Number of children: Not on file   Years of education: Not on file   Highest education level: Not on file  Occupational History   Not on file  Tobacco Use   Smoking status: Never    Passive exposure: Yes   Smokeless tobacco: Never  Vaping Use   Vaping status: Never Used  Substance and Sexual Activity   Alcohol use: Yes    Alcohol/week: 9.0 standard drinks of alcohol    Types: 3 Glasses of wine, 3 Cans of beer, 3 Shots of liquor per week    Comment: 1 drink per day, either wine or beer   Drug use: Never   Sexual activity: Yes  Other Topics Concern   Not on file  Social  History Narrative   Right hand   One story home    Lives with domestic partner   Caffeine none .    Retired Engineer, civil (consulting) (traveler).  Retired 2019, disabled.     Social Drivers of Corporate investment banker Strain: Low Risk  (08/31/2023)   Overall Financial Resource Strain (CARDIA)    Difficulty of Paying Living Expenses: Not hard at all  Food Insecurity: No Food Insecurity (08/31/2023)   Hunger Vital Sign    Worried About Running Out of Food in the Last Year: Never true    Ran Out of Food in the Last Year: Never true  Transportation Needs: No Transportation Needs (08/31/2023)   PRAPARE - Administrator, Civil Service (Medical): No    Lack of Transportation (Non-Medical): No  Physical Activity: Insufficiently Active (08/31/2023)   Exercise Vital Sign    Days of Exercise per Week: 5 days    Minutes of Exercise per Session: 10 min  Stress: Stress Concern Present (08/31/2023)   [employer] of Occupational Health - Occupational Stress Questionnaire    Feeling of Stress : To some extent  Social Connections: Moderately Isolated (08/31/2023)   Social Connection and Isolation Panel    Frequency of Communication with Friends and Family: More than three times a week    Frequency of Social Gatherings with Friends and Family: More than three times a week    Attends Religious Services: 1 to 4 times per year    Active Member of [employer] or Organizations: No    Attends Banker Meetings: Never    Marital Status: Divorced  Catering manager Violence: Not At Risk (08/31/2023)   Humiliation, Afraid, Rape, and Kick questionnaire    Fear of Current or Ex-Partner: No    Emotionally Abused: No    Physically Abused: No    Sexually Abused: No    Family History:  Family History  Problem Relation Age of Onset   Diabetes type II Mother    Alzheimer's disease Mother    Diabetes type II Father    Kidney disease Father  Healthy Son    Healthy Son    Healthy Son    Healthy Son     Parkinson's disease Neg Hx     Medications:   Current Outpatient Medications on File Prior to Visit  Medication Sig Dispense Refill   aspirin  EC 81 MG tablet Take 1 tablet (81 mg total) by mouth daily. 90 tablet 3   carbidopa -levodopa  (SINEMET  IR) 25-100 MG tablet Take 1.5 tabs 5x daily 225 tablet 11   Olmesartan -amLODIPine -HCTZ 40-5-12.5 MG TABS Take 1 tablet by mouth daily. 90 tablet 2   pramipexole  (MIRAPEX ) 0.5 MG tablet Take 1 tablet (0.5 mg total) by mouth 3 (three) times daily. 270 tablet 0   rosuvastatin  (CRESTOR ) 10 MG tablet TAKE 1 TABLET(10 MG) BY MOUTH DAILY 90 tablet 3   No current facility-administered medications on file prior to visit.    Allergies:  No Known Allergies    OBJECTIVE:  Physical Exam  Vitals:   03/14/24 1351  BP: 120/70  Pulse: 80  Weight: 236 lb 12.8 oz (107.4 kg)  Height: 5' 6 (1.676 m)   Body mass index is 38.22 kg/m. No results found.  General: Morbidly obese very pleasant middle-aged African-American female, seated, in no evident distress  Neurologic Exam Mental Status: Awake and fully alert.  Fluent speech and language.  Oriented to place and time. Recent and remote memory intact. Attention span, concentration and fund of knowledge appropriate. Mood and affect appropriate.  Mild facial masking. Cranial Nerves: Pupils equal, briskly reactive to light. Extraocular movements full without nystagmus. Visual fields full to confrontation. Hearing intact. Facial sensation intact. Face, tongue, palate moves normally and symmetrically.  Motor: Normal strength in all tested extremity muscles.  Slightly increased tone RUE, right cogwheel rigidity. Mild persistent R>L resting tremor.  Intermittent right foot tremor.  Mild RUE postural tremor. Sensory.: intact to touch , pinprick , position and vibratory sensation.  Coordination: Rapid alternating movements good in upper extremities, slightly decreased foot taps bilaterally. Finger-to-nose and  heel-to-shin performed accurately bilaterally. Gait and Station: Arises from chair without difficulty. Stance is mildly stooped. Gait demonstrates normal stride length and balance without use of AD.  No evidence of shuffling or freezing.  Decreased RUE arm swing, adequate LUE arm swing, pill-rolling tremor LUE>RUE.   Reflexes: 1+ and symmetric. Toes downgoing.         ASSESSMENT/PLAN: Briyonna Omara is a 58 y.o. year old female   Right-sided predominant Parkinson's disease -Continue Sinemet  25/100  1.5 tabs 5 times daily-refill provided -Continue Mirapex  0.5 mg 3 times daily-refill provided -will discuss potential benefit with inhaled levodopa  for off times -advised limited data re: use of medicinal marijuana to assist with PD but will f/u with Dr. Omar Bibber regarding this -Discussed importance of routine physical activity and exercise as tolerated  Sleep disturbance -declines interest in pursuing sleep study - advised to call if she wishes to pursue in the future      Follow up in 6 months or call earlier if needed    CC:  PCP: Jearldine Mina, DO    I personally spent a total of 30 minutes in the care of the patient today including preparing to see the patient, performing a medically appropriate exam/evaluation, counseling and educating, placing orders, and documenting clinical information in the EHR.   Johny Nap, AGNP-BC  [facility] [address] [place], [place] [address]  Phone [phone] Fax [phone] Note: This document was prepared with digital dictation and possible smart phrase  technology. Any transcriptional errors that result from this process are unintentional.

## 2024-03-14 NOTE — Patient Instructions (Addendum)
 Your Plan:  Continue current regimen   Please call with any worsening symptoms     Follow up in 6 months or call earlier if needed unaccompanied.     Thank you for coming to see us  at [facility]. I hope we have been able to provide you high quality care today.  You may receive a patient satisfaction survey over the next few weeks. We would appreciate your feedback and comments so that we may continue to improve ourselves and the health of our patients.

## 2024-03-14 NOTE — Progress Notes (Signed)
 Established Patient Office Visit  Subjective   Patient ID: Teresa Logan, female    DOB: [birth year]  Age: 58 y.o. MRN: [id number]  Chief Complaint  Patient presents with   Follow-up   Medication Refill   Hematuria    Patient is a 58 y.o. with a past medical history stated below who presents today for hypertension, diabetes, and hematuria. She has a history of Parkinson's for which she follows with neurology regularly. Had a question about the use of medicinal marijuana, told to discuss with neurologist given that she is currently taking centrally acting medications for Parkinson's. Please see problem based assessment and plan for additional details.     Past Medical History:  Diagnosis Date   Breast cancer screening    Encounter for hepatitis C screening test for low risk patient 08/28/2020   Encounter for screening for HIV 08/28/2020   HTN (hypertension)    Kidney stones    Need for shingles vaccine 12/30/2021   Parkinson's disease (HCC)    Screening for colorectal cancer    Tremors of nervous system       Review of Systems  Constitutional:  Negative for fever.  Cardiovascular:  Negative for chest pain, palpitations and leg swelling.  Gastrointestinal:  Negative for abdominal pain, nausea and vomiting.  Genitourinary:  Negative for dysuria, flank pain and frequency.     Objective:     BP (!) 144/87 (BP Location: Right Arm, Patient Position: Sitting, Cuff Size: Normal)   Pulse 82   Temp 98.3 F (36.8 C) (Oral)   Ht 5' 6 (1.676 m)   Wt 236 lb 9.6 oz (107.3 kg)   SpO2 99%   BMI 38.19 kg/m  BP Readings from Last 3 Encounters:  03/14/24 120/70  03/14/24 (!) 144/87  08/31/23 95/66   Wt Readings from Last 3 Encounters:  03/14/24 236 lb 12.8 oz (107.4 kg)  03/14/24 236 lb 9.6 oz (107.3 kg)  08/31/23 235 lb 12.8 oz (107 kg)      Physical Exam HENT:     Head: Normocephalic and atraumatic.   Cardiovascular:     Rate and Rhythm: Normal rate and regular  rhythm.     Pulses: Normal pulses.     Heart sounds: Normal heart sounds.  Pulmonary:     Effort: Pulmonary effort is normal.     Breath sounds: Normal breath sounds.  Abdominal:     General: Abdomen is flat.   Musculoskeletal:        General: Normal range of motion.   Skin:    General: Skin is warm.   Neurological:     General: No focal deficit present.     Mental Status: She is alert.     Comments: Tremor observed in upper and lower extremities.   Psychiatric:        Mood and Affect: Mood normal.    Results for orders placed or performed in visit on 03/14/24  Microscopic Examination  Result Value Ref Range   WBC, UA 0-5 0 - 5 /hpf   RBC, Urine >30 (A) 0 - 2 /hpf   Epithelial Cells (non renal) 0-10 0 - 10 /hpf   Casts None seen None seen /lpf   Bacteria, UA Few None seen/Few  Glucose, capillary  Result Value Ref Range   Glucose-Capillary 124 (H) 70 - 99 mg/dL  WUJ8+JXBJY Gap  Result Value Ref Range   Glucose 117 (H) 70 - 99 mg/dL   BUN 11 6 - 24  mg/dL   Creatinine, Ser 9.60 0.57 - 1.00 mg/dL   eGFR 85 >45 WU/JWJ/1.91   BUN/Creatinine Ratio 14 9 - 23   Sodium 138 134 - 144 mmol/L   Potassium 4.2 3.5 - 5.2 mmol/L   Chloride 100 96 - 106 mmol/L   CO2 21 20 - 29 mmol/L   Anion Gap 17.0 10.0 - 18.0 mmol/L   Calcium  9.5 8.7 - 10.2 mg/dL  Lipid panel  Result Value Ref Range   Cholesterol, Total 183 100 - 199 mg/dL   Triglycerides 478 (H) 0 - 149 mg/dL   HDL 38 (L) >29 mg/dL   VLDL Cholesterol Cal 42 (H) 5 - 40 mg/dL   LDL Chol Calc (NIH) 562 (H) 0 - 99 mg/dL   Chol/HDL Ratio 4.8 (H) 0.0 - 4.4 ratio  Urinalysis, Reflex Microscopic  Result Value Ref Range   Specific Gravity, UA 1.019 1.005 - 1.030   pH, UA 6.5 5.0 - 7.5   Color, UA Yellow Yellow   Appearance Ur Clear Clear   Leukocytes,UA Negative Negative   Protein,UA Negative Negative/Trace   Glucose, UA Negative Negative   Ketones, UA Negative Negative   RBC, UA 3+ (A) Negative   Bilirubin, UA Negative  Negative   Urobilinogen, Ur 0.2 0.2 - 1.0 mg/dL   Nitrite, UA Negative Negative   Microscopic Examination See below:   POC Hbg A1C  Result Value Ref Range   Hemoglobin A1C 7.0 (A) 4.0 - 5.6 %   HbA1c POC (<> result, manual entry)     HbA1c, POC (prediabetic range)     HbA1c, POC (controlled diabetic range)    POCT Urinalysis Dipstick (81002)  Result Value Ref Range   Color, UA Yellow    Clarity, UA Cloudy    Glucose, UA Negative Negative   Bilirubin, UA Negative    Ketones, UA Negative    Spec Grav, UA 1.020 1.010 - 1.025   Blood, UA Large    pH, UA 6.5 5.0 - 8.0   Protein, UA Negative Negative   Urobilinogen, UA 0.2 0.2 or 1.0 E.U./dL   Nitrite, UA Negative    Leukocytes, UA Trace (A) Negative    Last lipids Lab Results  Component Value Date   CHOL 183 03/14/2024   HDL 38 (L) 03/14/2024   LDLCALC 103 (H) 03/14/2024   TRIG 246 (H) 03/14/2024   CHOLHDL 4.8 (H) 03/14/2024     The 10-year ASCVD risk score (Arnett DK, et al., 2019) is: 10%    Assessment & Plan:   Problem List Items Addressed This Visit     Hypertension   Patient's blood pressure 144/87, HR 82. Denies any chest pain, heart palpitations, or lower extremity swelling. Taking olmesartan -amlodipine -hydrochlorothiazide  40-5-12.5 mg combination pill daily. She is also taking 162 mg of daily aspirin  which seems to be for primary prevention. Cardiovascular exam unremarkable. BMP today unremarkable. Patient hesitant to add medications, will continue current regimen but will need to monitor blood pressure at next visit in the event that adjustments to antihypertensive therapy need to be made.  Plan - Continue olmesartan -amlodipine -hydrochlorothiazide  40-5-12.5 mg daily - Need to look into 162 daily aspirin  use, continue for now - Return in 3 months for BP check and adjust medications as needed      Relevant Orders   BMP8+Anion Gap (Completed)   Hyperlipidemia   On rosuvastatin  10 mg daily. Today's lipid panel  shows LDL 103, goal is <70. Will need to consider adding Zetia at next visit or  changing statin and/or statin dose at next visit. For now will continue rosuvastatin  10 mg daily.       Relevant Orders   Lipid panel (Completed)   Type 2 diabetes mellitus (HCC) - Primary   Patient's A1c stable at 7.0% compared to 7.3% November 2023. Not on GLP-1, metformin, or SGLT-2. Is not interested in starting medications today, continues to make an effort to eat relatively healthy and increase activity. Is on a daily statin. Has diabetic eye exam scheduled with [facility] August 2025. Will continue to monitor.       Relevant Orders   POC Hbg A1C (Completed)   POCT Urinalysis Dipstick (98119) (Completed)   Healthcare maintenance   Patient agreeable to FOBT for colon cancer screening and mammogram for breast cancer screening. Placed orders today, follow up as needed.       Hematuria   Patient with RBCs in urinalysis. Has history of kidney stones although she is asymptomatic today and physical exam was negative for CVA pain or tenderness. Discussed possible next steps in workup with patient. Attempted notifying patient of results and return precautions should she develop symptoms such as gross hematuria, flank pain, or dysuria but patient did not pick up phone. Mychart message sent. Will need to follow up with patient, would recommend repeat urinalysis and possible urine culture at next visit. If repeat asymptomatic hematuria on repeat urinalysis could also consider urology referral for cystoscopy.  Plan - Repeat urinalysis in 3 months and possible urine culture - Consider future urology consult      Relevant Orders   Urinalysis, Reflex Microscopic (Completed)   Urinalysis, Reflex Microscopic   Other Visit Diagnoses       Colon cancer screening       Relevant Orders   Fecal occult blood, imunochemical     Breast cancer screening by mammogram       Relevant Orders   MM 3D SCREENING MAMMOGRAM BILATERAL  BREAST      Patient discussed with Dr. Lanetta Pion.  Return in about 3 months (around 06/14/2024) for diabetes, weight loss, blood pressure, urinalysis.   Wanette Robison Michelina Aho, MD PGY-1, [facility]

## 2024-03-14 NOTE — Patient Instructions (Signed)
 Thank you, Teresa Logan for allowing us  to provide your care today. Today we discussed your diabetes, blood pressure, urine, and referrals for healthcare maintenance.   I have ordered the following labs for you:   Lab Orders         Fecal occult blood, imunochemical         Glucose, capillary         BMP8+Anion Gap         Lipid panel         Urinalysis, Reflex Microscopic         POC Hbg A1C         POCT Urinalysis Dipstick (81002)      Tests ordered today:  None  Referrals ordered today:   Referral Orders  No referral(s) requested today     I have ordered the following medication/changed the following medications:   Stop the following medications: There are no discontinued medications.   Start the following medications: No orders of the defined types were placed in this encounter.    Follow up: 3 months for diabetes, weight loss, blood pressure, urine   Remember:   - I will call you with your lab results  - No changes were made to your medications  - They will call you to schedule your mammogram  - Please make sure to ask the neurologist about the medicinal marijuana  - Please make sure to go to your diabetic eye exam in August at [facility]  Should you have any questions or concerns please call the internal medicine clinic at [phone].     Teresa Vos Arellano Zameza, MD PGY-1 Internal Medicine Teaching [facility]

## 2024-03-15 LAB — MICROSCOPIC EXAMINATION
Casts: NONE SEEN /LPF
RBC, Urine: >30 /HPF — AB (ref 0–2)

## 2024-03-15 LAB — LIPID PANEL
Chol/HDL Ratio: 4.8 ratio — ABNORMAL HIGH (ref 0.0–4.4)
Cholesterol, Total: 183 mg/dL (ref 100–199)
HDL: 38 mg/dL — ABNORMAL LOW (ref >39)
LDL Chol Calc (NIH): 103 mg/dL — ABNORMAL HIGH (ref 0–99)
Triglycerides: 246 mg/dL — ABNORMAL HIGH (ref 0–149)
VLDL Cholesterol Cal: 42 mg/dL — ABNORMAL HIGH (ref 5–40)

## 2024-03-15 LAB — BMP8+ANION GAP
Anion Gap: 17 mmol/L (ref 10.0–18.0)
BUN/Creatinine Ratio: 14 (ref 9–23)
BUN: 11 mg/dL (ref 6–24)
CO2: 21 mmol/L (ref 20–29)
Calcium: 9.5 mg/dL (ref 8.7–10.2)
Chloride: 100 mmol/L (ref 96–106)
Creatinine, Ser: 0.8 mg/dL (ref 0.57–1.00)
Glucose: 117 mg/dL — ABNORMAL HIGH (ref 70–99)
Potassium: 4.2 mmol/L (ref 3.5–5.2)
Sodium: 138 mmol/L (ref 134–144)
eGFR: 85 mL/min/{1.73_m2} (ref >59)

## 2024-03-15 LAB — URINALYSIS, ROUTINE W REFLEX MICROSCOPIC
Bilirubin, UA: NEGATIVE
Glucose, UA: NEGATIVE
Ketones, UA: NEGATIVE
Leukocytes,UA: NEGATIVE
Nitrite, UA: NEGATIVE
Protein,UA: NEGATIVE
Specific Gravity, UA: 1.019 (ref 1.005–1.030)
Urobilinogen, Ur: 0.2 mg/dL (ref 0.2–1.0)
pH, UA: 6.5 (ref 5.0–7.5)

## 2024-03-16 DIAGNOSIS — Z0000 Encounter for general adult medical examination without abnormal findings: Secondary | ICD-10-CM | POA: Insufficient documentation

## 2024-03-16 DIAGNOSIS — R319 Hematuria, unspecified: Secondary | ICD-10-CM | POA: Insufficient documentation

## 2024-03-16 NOTE — Assessment & Plan Note (Signed)
 Patient with RBCs in urinalysis. Has history of kidney stones although she is asymptomatic today and physical exam was negative for CVA pain or tenderness. Discussed possible next steps in workup with patient. Attempted notifying patient of results and return precautions should she develop symptoms such as gross hematuria, flank pain, or dysuria but patient did not pick up phone. Mychart message sent. Will need to follow up with patient, would recommend repeat urinalysis and possible urine culture at next visit. If repeat asymptomatic hematuria on repeat urinalysis could also consider urology referral for cystoscopy.  Plan - Repeat urinalysis in 3 months and possible urine culture - Consider future urology consult

## 2024-03-16 NOTE — Assessment & Plan Note (Addendum)
 Patient's A1c stable at 7.0% compared to 7.3% November 2023. Not on GLP-1, metformin, or SGLT-2. Is not interested in starting medications today, continues to make an effort to eat relatively healthy and increase activity. Is on a daily statin. Has diabetic eye exam scheduled with [facility] August 2025. Will continue to monitor.

## 2024-03-16 NOTE — Assessment & Plan Note (Signed)
 On rosuvastatin  10 mg daily. Today's lipid panel shows LDL 103, goal is <70. Will need to consider adding Zetia at next visit or changing statin and/or statin dose at next visit. For now will continue rosuvastatin  10 mg daily.

## 2024-03-16 NOTE — Assessment & Plan Note (Signed)
 Patient agreeable to FOBT for colon cancer screening and mammogram for breast cancer screening. Placed orders today, follow up as needed.

## 2024-03-16 NOTE — Assessment & Plan Note (Signed)
 Patient's blood pressure 144/87, HR 82. Denies any chest pain, heart palpitations, or lower extremity swelling. Taking olmesartan -amlodipine -hydrochlorothiazide  40-5-12.5 mg combination pill daily. She is also taking 162 mg of daily aspirin  which seems to be for primary prevention. Cardiovascular exam unremarkable. BMP today unremarkable. Patient hesitant to add medications, will continue current regimen but will need to monitor blood pressure at next visit in the event that adjustments to antihypertensive therapy need to be made.  Plan - Continue olmesartan -amlodipine -hydrochlorothiazide  40-5-12.5 mg daily - Need to look into 162 daily aspirin  use, continue for now - Return in 3 months for BP check and adjust medications as needed

## 2024-03-19 NOTE — Progress Notes (Signed)
 Internal Medicine Clinic Attending  Case discussed with the resident at the time of the visit.  We reviewed the resident's history and exam and pertinent patient test results.  I agree with the assessment, diagnosis, and plan of care documented in the resident's note.

## 2024-03-20 MED ORDER — INBRIJA 42 MG IN CAPS: 84.0000 mg | ORAL_CAPSULE | Freq: Two times a day (BID) | RESPIRATORY_TRACT | 11 refills | Status: AC | PRN

## 2024-03-20 NOTE — Telephone Encounter (Signed)
-----   Message from [place] sent at 03/20/2024  3:05 PM EDT ----- Please call patient (she does not use mychart) regarding Dr. Obie recommendation - can trial Inbrija  (inhaled carbidopa /levodopa ) for off times, no more than twice per day. Will send order to pharmacy, will require PA. If not interested in pursuing, can cancel order. She does not recommend use of THC products for PD. Thank you!

## 2024-03-20 NOTE — Telephone Encounter (Signed)
 Cld Pt to relay message from NP. No answer. LVM with details (per DPR) asked Pt to call back to let us  know if she wants to pursue the Inbrija  as it does require PA.

## 2024-03-20 NOTE — Addendum Note (Signed)
 Addended by: WHITFIELD RAISIN L on: 03/20/2024 03:05 PM   Modules accepted: Orders

## 2024-03-21 ENCOUNTER — Telehealth: Payer: Self-pay

## 2024-03-21 NOTE — Telephone Encounter (Signed)
-----   Message from [place] sent at 03/20/2024  3:05 PM EDT ----- Please call patient (she does not use mychart) regarding Dr. Obie recommendation - can trial Inbrija  (inhaled carbidopa /levodopa ) for off times, no more than twice per day. Will send order to pharmacy, will require PA. If not interested in pursuing, can cancel order. She does not recommend use of THC products for PD. Thank you!

## 2024-03-21 NOTE — Telephone Encounter (Signed)
-----   Message from [place] sent at 03/20/2024  3:05 PM EDT ----- Please call patient (she does not use mychart) regarding Dr. Obie recommendation - can trial Inbrija  (inhaled carbidopa /levodopa ) for off times, no more than twice per day. Will send order to pharmacy, will require PA. If not interested in pursuing, can cancel order. She does not recommend use of THC products for PD. Thank you!

## 2024-03-21 NOTE — Telephone Encounter (Signed)
 Attempted to call Pt regarding Inbrija  script. No answer, LVM for call back.

## 2024-03-21 NOTE — Telephone Encounter (Signed)
 Pt cld back. Explained NP notes regarding Inbrija . Pt stated she is interested in trying medication and is aware PA is needed. Informed Pt PA request will be made and we will notify with response. Pt voiced understanding.

## 2024-03-22 ENCOUNTER — Other Ambulatory Visit (HOSPITAL_COMMUNITY): Payer: Self-pay

## 2024-03-22 ENCOUNTER — Telehealth: Payer: Self-pay

## 2024-03-22 NOTE — Telephone Encounter (Signed)
 Pharmacy Patient Advocate Encounter   Received notification from Physician's Office that prior authorization for Inbrija  42MG  capsules is required/requested.   Insurance verification completed.   The patient is insured through [facility] .   Per test claim: PA required; PA submitted to above mentioned insurance via CoverMyMeds Key/confirmation #/EOC [id number] Status is pending

## 2024-03-23 ENCOUNTER — Other Ambulatory Visit (HOSPITAL_COMMUNITY): Payer: Self-pay

## 2024-03-23 NOTE — Telephone Encounter (Signed)
 Pharmacy Patient Advocate Encounter  Received notification from [employer] that Prior Authorization for Inbrija  42MG  capsules has been APPROVED from 03/22/2024 to 09/27/2024. Unable to obtain price due to refill too soon rejection, last fill date 03/22/2024 next available fill date7/18/2025   PA #/Case ID/Reference #: PA Case ID #: [id number]

## 2024-04-20 ENCOUNTER — Telehealth: Payer: Self-pay | Admitting: Neurology

## 2024-04-20 NOTE — Telephone Encounter (Signed)
 I received a clearance form for patient to participate in a Parkinson's disease related study at [facility].  Based on my visit with her in June 2024, I signed off on her participation with the proviso that things can change over the course of 1 year including her symptoms and her examination.  Her study participation will involve physical examinations and participating in study visits while off medication.

## 2024-04-20 NOTE — Telephone Encounter (Signed)
 Clearance form faxed back to Ellouise Maser @ [employer] [phone]. Received a receipt of confirmation.

## 2024-06-21 ENCOUNTER — Encounter: Admitting: Student

## 2024-07-21 ENCOUNTER — Telehealth: Payer: Self-pay | Admitting: Student

## 2024-07-21 NOTE — Telephone Encounter (Signed)
 Rtn call to the pt. N/a Copied from CRM [id number]. Topic: Appointments - Scheduling Inquiry for Clinic >> Jul 20, 2024  4:02 PM Miquel SAILOR wrote: Reason for CRM: PT requesting follow for medication review for 10/09/24 but nothing available for PCP. Only want PCP. Needs call back on scheduling due to no available dates for Jan 2026 [phone]

## 2024-09-04 ENCOUNTER — Other Ambulatory Visit: Payer: Self-pay

## 2024-09-04 DIAGNOSIS — E785 Hyperlipidemia, unspecified: Secondary | ICD-10-CM

## 2024-09-04 MED ORDER — ROSUVASTATIN CALCIUM 10 MG PO TABS: ORAL_TABLET | ORAL | 3 refills | Status: AC

## 2024-09-04 NOTE — Telephone Encounter (Signed)
 I called pt to sch her an appt for fu visit, but no answer. Last office visit 03/14/2024. I left detailed message to call the clinic. Pharmacy is requesting a refill, please reply back.

## 2024-10-09 ENCOUNTER — Ambulatory Visit: Payer: Self-pay

## 2024-10-09 ENCOUNTER — Telehealth: Payer: Self-pay

## 2024-10-09 ENCOUNTER — Ambulatory Visit: Admitting: Adult Health

## 2024-10-09 ENCOUNTER — Encounter: Payer: Self-pay | Admitting: Adult Health

## 2024-10-09 VITALS — BP 128/89 | HR 98 | Temp 98.2°F | Ht 66.0 in | Wt 239.0 lb

## 2024-10-09 VITALS — BP 125/81 | HR 85 | Ht 66.0 in | Wt 238.8 lb

## 2024-10-09 DIAGNOSIS — Z1211 Encounter for screening for malignant neoplasm of colon: Secondary | ICD-10-CM

## 2024-10-09 DIAGNOSIS — E119 Type 2 diabetes mellitus without complications: Secondary | ICD-10-CM

## 2024-10-09 DIAGNOSIS — G479 Sleep disorder, unspecified: Secondary | ICD-10-CM

## 2024-10-09 DIAGNOSIS — G4700 Insomnia, unspecified: Secondary | ICD-10-CM | POA: Diagnosis not present

## 2024-10-09 DIAGNOSIS — I10 Essential (primary) hypertension: Secondary | ICD-10-CM | POA: Diagnosis not present

## 2024-10-09 DIAGNOSIS — K5900 Constipation, unspecified: Secondary | ICD-10-CM

## 2024-10-09 DIAGNOSIS — M25551 Pain in right hip: Secondary | ICD-10-CM | POA: Diagnosis not present

## 2024-10-09 DIAGNOSIS — G20A2 Parkinson's disease without dyskinesia, with fluctuations: Secondary | ICD-10-CM | POA: Diagnosis not present

## 2024-10-09 DIAGNOSIS — Z23 Encounter for immunization: Secondary | ICD-10-CM

## 2024-10-09 DIAGNOSIS — Z0000 Encounter for general adult medical examination without abnormal findings: Secondary | ICD-10-CM

## 2024-10-09 LAB — POCT GLYCOSYLATED HEMOGLOBIN (HGB A1C): HbA1c, POC (controlled diabetic range): 6.4 % (ref 0.0–7.0)

## 2024-10-09 LAB — GLUCOSE, CAPILLARY: Glucose-Capillary: 138 mg/dL — ABNORMAL HIGH (ref 70–99)

## 2024-10-09 MED ORDER — PRAMIPEXOLE DIHYDROCHLORIDE 0.5 MG PO TABS: 0.5000 mg | ORAL_TABLET | Freq: Three times a day (TID) | ORAL | 3 refills | Status: AC

## 2024-10-09 MED ORDER — LIDOCAINE 5 % EX PTCH: 1.0000 | MEDICATED_PATCH | CUTANEOUS | 0 refills | Status: AC

## 2024-10-09 MED ORDER — TRAZODONE HCL 50 MG PO TABS: 25.0000 mg | ORAL_TABLET | Freq: Every evening | ORAL | 0 refills | Status: DC | PRN

## 2024-10-09 MED ORDER — PSYLLIUM 58.6 % PO PACK: 1.0000 | PACK | Freq: Every day | ORAL | 12 refills | Status: AC

## 2024-10-09 MED ORDER — CARBIDOPA-LEVODOPA 25-100 MG PO TABS: 1.5000 | ORAL_TABLET | ORAL | 11 refills | Status: AC

## 2024-10-09 MED ORDER — SENNA 8.6 MG PO TABS: 1.0000 | ORAL_TABLET | Freq: Every day | ORAL | 0 refills | Status: AC | PRN

## 2024-10-09 NOTE — Assessment & Plan Note (Signed)
 Normotensive.  Blood pressure at home has been 120s to 130s/80   Plan: Continue olmesartan -amlodipine -hydrochlorothiazide  40-5-12.5 mg

## 2024-10-09 NOTE — Assessment & Plan Note (Signed)
 Patient states that she has been having trouble sleeping more recently with her tremors.  She states in the past she has tried melatonin and trazodone  and this has helped.  She states that she would like something for mood and for sleep.  I did discuss with her the options of seeing you in our clinic as well as starting an SSRI.  Did state that trazodone  could be helpful for the short-term the long-term something that could be helpful for her mood may also help her sleep.  Did also discuss sleep hygiene methods and the patient reports being compliant with these.  Plan: Discussed sleep hygiene methods Try melatonin up to 5 mg for a week, if this does not help sleep has been in 3-week supply of trazodone  50 mg to help with sleep. Consider counseling at next session.  Also discussed discussing with neurologist about Parkinson's support groups as patient has tried this before and found it helpful.

## 2024-10-09 NOTE — Assessment & Plan Note (Signed)
 Discussed colon cancer screening with fecal occult, patient declined colonoscopy.  Last 2 fecal occult blood negative. Patient received flu shot today Patient knows the number to call back to schedule mammogram

## 2024-10-09 NOTE — Telephone Encounter (Signed)
 Teresa Logan (Key: [id number]) PA Case ID #: [id number] Rx #: [id number] Need Help? Call us  at [phone] Outcome Approved today by [employer] Medicare 2017 NCPDP Request Reference Number: [id number]. LIDOCAINE  DIS 5% PATCH is approved through 09/27/2025. Your patient may now fill this prescription and it will be covered. Effective Date: 10/09/2024 Authorization Expiration Date: 09/27/2025 Drug Lidocaine  5% patches ePA cloud logo Form [employer] Medicare Part D Electronic Prior Authorization Form [phone] NCPDP) Original Claim Info [id number] [phone]

## 2024-10-09 NOTE — Progress Notes (Signed)
 " [facility] [address] [place]. [place] [address] [phone]       OFFICE FOLLOW UP NOTE  Teresa Logan Date of Birth:  22-Feb-1966 Medical Record Number:  [id number]    Primary neurologist: Dr. Buck Reason for visit: Parkinson's disease    SUBJECTIVE:   CHIEF COMPLAINT:  Chief Complaint  Patient presents with   Follow-up    Patient is in room 8 alone.  Patient is here for parkinson follow up, patients states that she's noticed her tremor has increased. Patient stated that the breakthrough medication doesn't provide her relief.     HPI:    Update 10/09/2024 JM: Patient returns for follow-up visit unaccompanied.  At prior visit, she was started on Inbrija  for occasional off times in addition to Sinemet  1.5 tab 5 times daily and pramipexole  1 tab TID.  Denied any benefit with use of inbrija . She started having wearing off symptoms frequently therefore she self increased Sinemet  dosage 1.5 tabs to 6 times daily, currently taking every 4 hours, she feels this has been greatly beneficial and not experiencing as much off time.  She also continues on pramipexole .  She denies any side effects with medications.  She ambulates without AD, no recent falls.  Reports doing routine exercises at home as previously advised by PT.  She saw PCP today and discussed increased anxiety and insomnia, use of melatonin recommended to help with sleep and advised to start trazodone  after 1 week if melatonin ineffective.       History provided for reference purposes only Update 03/14/2024 JM: Patient returns for follow-up visit unaccompanied.  Reports overall stable since prior visit. Remains on Sinemet  1.5 tab 5 day (11a, 4p, 9p, 2a, 6a) and Pramipexole  1 tab TID (11a, 4p, 9p). Tolerates medications well. Can occasionally have worsening resting tremor in between Sinemet  dosages.  Denies any gait difficulty, ambulates without AD, no recent falls.  Previously discussed  pursuing sleep study but declines interest at this time.  She tries to stay active with routine physical activity.  Routinely follows with PCP.  She does question use of medicinal marijuana to further help with tremor.  Update 08/31/2023 JM: Patient returns for follow-up unaccompanied.  Stable since prior visit.  Remains on Sinemet  and pramipexole , denies side effects.  Continues to have irregular sleep patterns from longstanding history of working night shifts.  She has not yet been scheduled for sleep study as previously recommended by Dr. Buck.  Tries to stay active with routine physical activity, ambulates without AD, no recent falls.  No questions or concerns at this time.  Update 03/17/2023 Dr. Buck: She reports doing fairly.  She has significant difficulty with her sleep and erratic sleep.  She used to work nights and often sleeps during the day, may go to bed around 10 AM and sleep till 4 or 5 AM but sleep is interrupted, she takes her blood pressure medication, goes to the bathroom, takes her Sinemet  and goes back to sleep.  She does not sleep well at night, she had tried trazodone  in the past.  She snores, she never had a sleep study.  Her Epworth sleepiness score is 12 out of 24, fatigue severity score is 59 out of 63.  Her primary care prescribed Jardiance  at the last visit but she is not taking it, she decided against it but did not notify her primary care.  Her A1c in November 2023 was 7.3 and had gone up compared to about 7  months prior.  She has a follow-up appointment this week.  She has not been exercising regularly.  She used to go to the gym up to 3 times a week.  She has not fallen in the recent past since the last visit.  She drinks no daily caffeine, occasional soda.  She drinks alcohol occasionally.  She does not smoke cigarettes but reports that she has smokers in the house who smoke inside the house.   Update 07/13/2022 JM: Patient is being seen for 35-month follow-up regarding  Parkinson's disease unaccompanied.  After prior visit, Dr. Buck recommended changing Sinemet  dosage to 1.5 pills 5 times daily (6am, 10am, 2pm, 6pm and 10pm) and continue pramipexole  1 pills TID.  She reports currently taking Sinemet  1.5 mg tablets at 11a, 4p, 9p, 2a and 6a.  Reports her sleeping pattern is erratic, will not take up specifically at 2 AM to take medication but if she is up at that time, she will take it.  Currently taking pramipexole  at 11a, 7p and 3a.  Reports having good days and bad days, overall stable since prior visit.  Reports gait has been stable, denies any recent falls.  Update 03/10/2022 JM: Patient returns for follow-up visit after prior initial consult visit with Dr. Buck 4 months ago.  She is unaccompanied.  She has been stable since prior visit.  Currently taking Sinemet  25/100 2 pills 6am, 2 pills 10am and 1 pill 2pm as well as pramipexole  0.5 mg 3 times daily.  Tolerates medications well without side effects.  Reports around 6 PM, she experiences worsening in her tremors.  She typically goes to bed around midnight.  She does note worsening of her tremors prior to her next dose. She denies taking pills with meals or around meal time. Is requesting Sinemet  dosage be increased.  Does continue to have occasional constipation, not currently using any type of laxative.  She does admit to continued limited water intake.  She does have concerns regarding weight gain.  Gait has been stable, does not use assistive device, denies any recent falls.  No further concerns at this time.  Consult visit 11/03/2021 Dr. Buck: [name] is a 59 year old right-handed woman with an underlying medical history of hypertension, kidney stones, prediabetes, and obesity, who was previously diagnosed with Parkinson's disease.  She had follow-up with Dr. Asberry until May 2022, and started seeing Dr. Evonnie in 04/21.  Prior to that, patient was followed by Dr. Annemarie [facility], [place]  where  patient previously lived.  Patient reports symptoms dating back to 2013 when she first started having of right foot tremor.  She had a DaTscan in or around 2014 which supported Parkinson's disease.  She was lost to follow-up until 2018 or 19 and started treatment with levodopa  with her previous neurologist.  She feels that the medication has been helpful.  She currently takes levodopa  2 pills in the morning, 2 pills midday and 1 pill in the afternoon along with pramipexole  0.5 mg 3 times daily.  I reviewed Dr. Collie office notes from 02/20/2021, as well as 01/15/2020.  She has had some weight gain.  She was advised to continue with generic Sinemet  25-100 mg strength as well as pramipexole  0.5 mg strength 1 pill 3 times daily.  She has been on pramipexole  since November 2021. She has had intermittent constipation, has a bowel movement every other day approximately.  She does not always hydrate well and estimates that she drinks about 1 bottle of water  per day on average.  She denies a family history of Parkinson's disease.  She has had 1 fall in 2022.  She reports that she fell in the bathroom at her daughter's house in [place].  She does not typically use a cane or walker.  She has had therapy.  She drinks alcohol daily in the form of 1 glass of wine or 1 beer.  She does not drink caffeine daily.  She does not have any records from her neurologist in [place] .  She does not know where they are.  A DaTscan report was not available for my review today but some records were not available apparently when patient saw Dr. Evonnie initially.      ROS:   14 system review of systems performed and negative with exception of those listed in HPI  PMH:  Past Medical History:  Diagnosis Date   Breast cancer screening    Encounter for hepatitis C screening test for low risk patient 08/28/2020   Encounter for screening for HIV 08/28/2020   HTN (hypertension)    Kidney stones    Need for shingles vaccine 12/30/2021    Parkinson's disease (HCC)    Screening for colorectal cancer    Tremors of nervous system     PSH:  Past Surgical History:  Procedure Laterality Date   TONSILLECTOMY      Social History:  Social History   Socioeconomic History   Marital status: Single    Spouse name: Not on file   Number of children: Not on file   Years of education: Not on file   Highest education level: Not on file  Occupational History   Not on file  Tobacco Use   Smoking status: Never    Passive exposure: Yes   Smokeless tobacco: Never  Vaping Use   Vaping status: Never Used  Substance and Sexual Activity   Alcohol use: Yes    Alcohol/week: 9.0 standard drinks of alcohol    Types: 3 Glasses of wine, 3 Cans of beer, 3 Shots of liquor per week    Comment: 1 drink per day, either wine or beer   Drug use: Never   Sexual activity: Yes  Other Topics Concern   Not on file  Social History Narrative   Right hand   One story home    Lives with domestic partner   Caffeine none .    Retired Engineer, Civil (consulting) (traveler).  Retired 2019, disabled.     Social Drivers of Health   Tobacco Use: Medium Risk (10/09/2024)   Patient History    Smoking Tobacco Use: Never    Smokeless Tobacco Use: Never    Passive Exposure: Yes  Financial Resource Strain: Medium Risk (10/09/2024)   Overall Financial Resource Strain (CARDIA)    Difficulty of Paying Living Expenses: Somewhat hard  Food Insecurity: No Food Insecurity (10/09/2024)   Epic    Worried About Programme Researcher, Broadcasting/film/video in the Last Year: Never true    Ran Out of Food in the Last Year: Never true  Transportation Needs: No Transportation Needs (10/09/2024)   Epic    Lack of Transportation (Medical): No    Lack of Transportation (Non-Medical): No  Physical Activity: Insufficiently Active (10/09/2024)   Exercise Vital Sign    Days of Exercise per Week: 7 days    Minutes of Exercise per Session: 20 min  Stress: Stress Concern Present (10/09/2024)   [employer] of  Occupational Health - Occupational Stress Questionnaire    Feeling  of Stress: Very much  Social Connections: Moderately Integrated (10/09/2024)   Social Connection and Isolation Panel    Frequency of Communication with Friends and Family: More than three times a week    Frequency of Social Gatherings with Friends and Family: More than three times a week    Attends Religious Services: 1 to 4 times per year    Active Member of [employer] or Organizations: No    Attends Banker Meetings: Never    Marital Status: Living with partner  Intimate Partner Violence: Not At Risk (10/09/2024)   Epic    Fear of Current or Ex-Partner: No    Emotionally Abused: No    Physically Abused: No    Sexually Abused: No  Depression (PHQ2-9): Low Risk (10/09/2024)   Depression (PHQ2-9)    PHQ-2 Score: 0  Alcohol Screen: Low Risk (10/09/2024)   Alcohol Screen    Last Alcohol Screening Score (AUDIT): 2  Housing: High Risk (10/09/2024)   Epic    Unable to Pay for Housing in the Last Year: Yes    Number of Times Moved in the Last Year: 0    Homeless in the Last Year: No  Utilities: Not At Risk (10/09/2024)   Epic    Threatened with loss of utilities: No  Health Literacy: Adequate Health Literacy (10/09/2024)   B1300 Health Literacy    Frequency of need for help with medical instructions: Never    Family History:  Family History  Problem Relation Age of Onset   Diabetes type II Mother    Alzheimer's disease Mother    Diabetes type II Father    Kidney disease Father    Healthy Son    Healthy Son    Healthy Son    Healthy Son    Parkinson's disease Neg Hx     Medications:   Current Outpatient Medications on File Prior to Visit  Medication Sig Dispense Refill   aspirin  EC 81 MG tablet Take 1 tablet (81 mg total) by mouth daily. 90 tablet 3   carbidopa -levodopa  (SINEMET  IR) 25-100 MG tablet Take 1.5 tabs 5x daily 225 tablet 11   Levodopa  (INBRIJA ) 42 MG CAPS Place 84 mg into inhaler and inhale 2  (two) times daily as needed. Limit use to only off times, no more than 2 times per day 60 capsule 11   Olmesartan -amLODIPine -HCTZ 40-5-12.5 MG TABS Take 1 tablet by mouth daily. 90 tablet 2   pramipexole  (MIRAPEX ) 0.5 MG tablet Take 1 tablet (0.5 mg total) by mouth 3 (three) times daily. 270 tablet 3   rosuvastatin  (CRESTOR ) 10 MG tablet TAKE 1 TABLET(10 MG) BY MOUTH DAILY 90 tablet 3   No current facility-administered medications on file prior to visit.    Allergies:  No Known Allergies    OBJECTIVE:  Physical Exam  Vitals:   10/09/24 1407  BP: 125/81  Pulse: 85  Weight: 238 lb 12.8 oz (108.3 kg)  Height: 5' 6 (1.676 m)    Body mass index is 38.54 kg/m. No results found.  General: Morbidly obese very pleasant middle-aged African-American female, seated, in no evident distress  Neurologic Exam Mental Status: Awake and fully alert.  Fluent speech and language.  Oriented to place and time. Recent and remote memory intact. Attention span, concentration and fund of knowledge appropriate. Mood and affect appropriate.  Mild facial masking. Cranial Nerves: Pupils equal, briskly reactive to light. Extraocular movements full without nystagmus. Visual fields full to confrontation. Hearing intact. Facial sensation intact.  Face, tongue, palate moves normally and symmetrically.  Motor: Normal strength in all tested extremity muscles.  Slightly increased tone RUE, right cogwheel rigidity. Mild persistent R>L resting tremor.  Intermittent right foot tremor.  Mild RUE postural tremor. Sensory.: intact to touch , pinprick , position and vibratory sensation.  Coordination: Rapid alternating movements good in upper extremities, slightly decreased foot taps bilaterally. Finger-to-nose and heel-to-shin performed accurately bilaterally. Gait and Station: Arises from chair without difficulty. Stance is mildly stooped. Gait demonstrates normal stride length and balance without use of AD.  No evidence  of shuffling or freezing.  Decreased RUE arm swing, adequate LUE arm swing, pill-rolling tremor LUE>RUE.           ASSESSMENT/PLAN: Teresa Logan is a 59 y.o. year old female   Right-sided predominant Parkinson's disease -Continue Sinemet  25/100  1.5 tabs 6 times daily-refill provided. Advised to call office if she feels she needs dosage adjustment, advised not to increase dosages on her own.  She verbalized understanding -Continue Mirapex  0.5 mg 3 times daily-refill provided -no benefit with use of Inbrija   -offered restart of PT but declines interest, continue HEP, call if interested in PT   Sleep disturbance -declines interest in pursuing sleep study - advised to call if she wishes to pursue in the future  -insomnia - saw PCP today, advised to start melatonin and if no benefit after 1 week, start trazodone  as she noted prior benefit with this medication.    Follow up in 6 months or call earlier if needed    CC:  PCP: Elicia Sharper, DO     Harlene Bogaert, AGNP-BC  [facility] [address] [place], [place] [address]  Phone [phone] Fax [phone] Note: This document was prepared with digital dictation and possible smart phrase technology. Any transcriptional errors that result from this process are unintentional.    "

## 2024-10-09 NOTE — Patient Instructions (Addendum)
 Your Plan:  Continue Sinemet  and Mirapex  at current dosages - refills provided  Please call if you feel Sinemet  dosage is not adequate prior to making any dosage adjustments  If interested in doing therapy, please let me know!      Follow up in 6 months or call earlier if needed     Thank you for coming to see us  at [facility]. I hope we have been able to provide you high quality care today.  You may receive a patient satisfaction survey over the next few weeks. We would appreciate your feedback and comments so that we may continue to improve ourselves and the health of our patients.

## 2024-10-09 NOTE — Patient Instructions (Addendum)
 Today we discussed the following medical conditions and plan:   Your blood pressure and A1c looks great at that visit.   For your fecal occult test, please follow the instructions and send the sample in to the lab of your choice   I am glad you were able to get your flu vaccine   For your mammogram, please reach out to the number you have to schedule it.   For your diabetic eye exam you can go to an optometrist to get this done!   I have also attached some exercises that will hopefully help with your pain   We look forward to seeing you next time. Please call our clinic at [phone] if you have any questions or concerns. The best time to call is Monday-Friday from 9am-4pm, but there is someone available 24/7. If you need medication refills, please notify your pharmacy one week in advance and they will send us  a request.   Thank you for trusting me with your care. Wishing you the best!   Mafalda Mcginniss D'Mello, DO  [facility]

## 2024-10-09 NOTE — Assessment & Plan Note (Signed)
 A1c of 6.4.   Plan:  Continue lifestyle modifications  Pending microalbumin/ creatinine

## 2024-10-09 NOTE — Progress Notes (Signed)
 "  Established Patient Office Visit  Subjective   Patient ID: Teresa Logan, female    DOB: 1966-05-29  Age: 59 y.o. MRN: [id number]  Chief Complaint  Patient presents with   Follow-up    HPI Patient is a 59 year old female with past medical history of Parkinson's disease, type 2 diabetes, obesity, hypertension that presents today for follow-up and with acute concern.  See problem-based assessment for more details   ROS See problem-based assessment for details  Objective:     BP 128/89 (BP Location: Left Arm, Patient Position: Sitting, Cuff Size: Large)   Pulse 98   Temp 98.2 F (36.8 C) (Oral)   Ht 5' 6 (1.676 m)   Wt 239 lb (108.4 kg)   SpO2 96%   BMI 38.58 kg/m  BP Readings from Last 3 Encounters:  10/09/24 125/81  10/09/24 128/89  03/14/24 120/70   Wt Readings from Last 3 Encounters:  10/09/24 238 lb 12.8 oz (108.3 kg)  10/09/24 239 lb (108.4 kg)  03/14/24 236 lb 12.8 oz (107.4 kg)      Physical Exam Constitutional:      General: She is not in acute distress.    Appearance: She is obese. She is not toxic-appearing.  Cardiovascular:     Rate and Rhythm: Normal rate and regular rhythm.     Heart sounds: No murmur heard. Pulmonary:     Effort: Pulmonary effort is normal. No respiratory distress.     Breath sounds: No wheezing.  Musculoskeletal:     Comments: Tenderness to palpation over right greater trochanter.  No tenderness over iliac crest.  Negative Fabere and FADIR.  Intact strength and sensation in lower extremities.  Straight leg raise negative  Skin:    General: Skin is dry.     Comments: Lower extremities, no open wounds or drainage noted  Neurological:     Mental Status: She is alert.      Results for orders placed or performed in visit on 10/09/24  Glucose, capillary  Result Value Ref Range   Glucose-Capillary 138 (H) 70 - 99 mg/dL  POC Hbg J8R  Result Value Ref Range   Hemoglobin A1C     HbA1c POC (<> result, manual entry)      HbA1c, POC (prediabetic range)     HbA1c, POC (controlled diabetic range) 6.4 0.0 - 7.0 %      The 10-year ASCVD risk score (Arnett DK, et al., 2019) is: 12%    Assessment & Plan:   Problem List Items Addressed This Visit       Cardiovascular and Mediastinum   Hypertension   Normotensive.  Blood pressure at home has been 120s to 130s/80   Plan: Continue olmesartan -amlodipine -hydrochlorothiazide  40-5-12.5 mg         Endocrine   Type 2 diabetes mellitus (HCC) - Primary   A1c of 6.4.   Plan:  Continue lifestyle modifications  Pending microalbumin/ creatinine        Relevant Orders   POC Hbg A1C (Completed)   Microalbumin / Creatinine Urine Ratio     Musculoskeletal and Integument   Greater trochanteric pain syndrome of right lower extremity   Patient believes her first of this was sciatica, but denied any numbness or tingling in bilateral lower extremities.  States that this been going on for the last 3 to 4 months.  States the pain started all of a sudden when she woke up.  No trauma, no injuries.  Patient states she cannot sleep,  lights on her put pressure.  Symptoms and percentage smoking.  She has been taking glucosamine.  Increase vitamin D.  Point tenderness to greater trochanter.  Has risk factors for this  Plan: Lidocaine  patches Given home exercise handout to help with this  Weight loss could also be beneficial in this situation         Other   Healthcare maintenance   Discussed colon cancer screening with fecal occult, patient declined colonoscopy.  Last 2 fecal occult blood negative. Patient received flu shot today Patient knows the number to call back to schedule mammogram      Insomnia   Patient states that she has been having trouble sleeping more recently with her tremors.  She states in the past she has tried melatonin and trazodone  and this has helped.  She states that she would like something for mood and for sleep.  I did discuss with her  the options of seeing you in our clinic as well as starting an SSRI.  Did state that trazodone  could be helpful for the short-term the long-term something that could be helpful for her mood may also help her sleep.  Did also discuss sleep hygiene methods and the patient reports being compliant with these.  Plan: Discussed sleep hygiene methods Try melatonin up to 5 mg for a week, if this does not help sleep has been in 3-week supply of trazodone  50 mg to help with sleep. Consider counseling at next session.  Also discussed discussing with neurologist about Parkinson's support groups as patient has tried this before and found it helpful.      Constipation   Patient states that she has the occasional constipation and would like prescription for senna.  Also recommended psyllium and fiber in her diet to help as well.   Plan: Prescription for senna and constipation      Other Visit Diagnoses       Encounter for immunization       Relevant Orders   Flu vaccine trivalent PF, 6mos and older(Flulaval,Afluria,Fluarix,Fluzone) (Completed)     Colon cancer screening       Relevant Orders   Fecal occult blood, imunochemical       Return in about 3 months (around 01/07/2025) for diabetes.    Kainen Struckman D'Mello, DO Patient seen with Dr. Shawn   "

## 2024-10-09 NOTE — Progress Notes (Signed)
 Internal Medicine Clinic Attending  Case discussed with the resident at the time of the visit.  We reviewed the resident's history and exam and pertinent patient test results.  I agree with the assessment, diagnosis, and plan of care documented in the resident's note.

## 2024-10-09 NOTE — Assessment & Plan Note (Signed)
 Patient believes her first of this was sciatica, but denied any numbness or tingling in bilateral lower extremities.  States that this been going on for the last 3 to 4 months.  States the pain started all of a sudden when she woke up.  No trauma, no injuries.  Patient states she cannot sleep, lights on her put pressure.  Symptoms and percentage smoking.  She has been taking glucosamine.  Increase vitamin D.  Point tenderness to greater trochanter.  Has risk factors for this  Plan: Lidocaine  patches Given home exercise handout to help with this  Weight loss could also be beneficial in this situation

## 2024-10-09 NOTE — Telephone Encounter (Signed)
 Prior Authorization for patient (Lidocaine  5% patches) came through on cover my meds was submitted with last office notes awaiting approval or denial.  [id number]

## 2024-10-09 NOTE — Assessment & Plan Note (Signed)
 Patient states that she has the occasional constipation and would like prescription for senna.  Also recommended psyllium and fiber in her diet to help as well.   Plan: Prescription for senna and constipation

## 2024-10-10 LAB — MICROALBUMIN / CREATININE URINE RATIO
Creatinine, Urine: 103.9 mg/dL
Microalb/Creat Ratio: 12 mg/g{creat} (ref 0–29)
Microalbumin, Urine: 12.6 ug/mL

## 2024-10-11 ENCOUNTER — Ambulatory Visit: Payer: Self-pay

## 2024-10-25 ENCOUNTER — Telehealth: Payer: Self-pay | Admitting: *Deleted

## 2024-10-25 NOTE — Telephone Encounter (Signed)
 Will forward to Dr. Elicia.                       Copied from CRM [id number]. Topic: Clinical - Medication Refill >> Oct 25, 2024 12:09 PM Carrielelia G wrote: Medication: uprofen (ADVIL ,MOTRIN ) 600 MG tablet   *Patient would like 800MG  if possible   Has the patient contacted their pharmacy? No (Agent: If no, request that the patient contact the pharmacy for the refill. If patient does not wish to contact the pharmacy document the reason why and proceed with request.) (Agent: If yes, when and what did the pharmacy advise?)  This is the patient's preferred pharmacy:  [facility] STORE #[id number], [place] - [address] AT [facility] [address] [place] [address] Phone: [phone] Fax: [phone]  Is this the correct pharmacy for this prescription? Yes If no, delete pharmacy and type the correct one.    Is the patient out of the medication? Yes  Has the patient been seen for an appointment in the last year OR does the patient have an upcoming appointment? Yes  Can we respond through MyChart? No  Agent: Please be advised that Rx refills may take up to 3 business days. We ask that you follow-up with your pharmacy.

## 2024-10-31 ENCOUNTER — Telehealth: Payer: Self-pay | Admitting: *Deleted

## 2024-10-31 NOTE — Telephone Encounter (Signed)
 Will forward to Dr. Elicia.                        Copied from CRM [id number]. Topic: Clinical - Medication Refill >> Oct 25, 2024 12:09 PM Carrielelia G wrote: Medication: uprofen (ADVIL ,MOTRIN ) 600 MG tablet   *Patient would like 800MG  if possible   Has the patient contacted their pharmacy? No (Agent: If no, request that the patient contact the pharmacy for the refill. If patient does not wish to contact the pharmacy document the reason why and proceed with request.) (Agent: If yes, when and what did the pharmacy advise?)  This is the patient's preferred pharmacy:  [facility] STORE #[id number] [facility], [place] - [address] AT [facility] [address] [place] [address] Phone: [phone] Fax: [phone]  Is this the correct pharmacy for this prescription? Yes If no, delete pharmacy and type the correct one.    Is the patient out of the medication? Yes  Has the patient been seen for an appointment in the last year OR does the patient have an upcoming appointment? Yes  Can we respond through MyChart? No  Agent: Please be advised that Rx refills may take up to 3 business days. We ask that you follow-up with your pharmacy. >> Oct 31, 2024  2:43 PM Marda G wrote: Patient calling on the status of her Medication request Ibuprofen  (ADVIL ,MOTRIN ) 800 MG  Please advise  >> Oct 30, 2024  9:04 AM Miquel SAILOR wrote: PT needs call back on update for medication. I let her know 3 day refill at location. [phone]

## 2024-11-01 ENCOUNTER — Ambulatory Visit: Payer: Self-pay

## 2024-11-01 NOTE — Telephone Encounter (Signed)
 RTC to patient states she continues to have pain in her right hip. Unable to sleep on the right side.  Was seen recently and given Lidocaine  Patches which do not help.  No redness , but is swollen and tender as before.  Stated tried an Ibuprofen  800 mg from a friend and this did help.  Informed patient will forward message to doctor who saw her before for the problem,.

## 2024-11-01 NOTE — Telephone Encounter (Signed)
 Call to patient transferred to [facility] to schedule an appointment.

## 2024-11-02 ENCOUNTER — Ambulatory Visit

## 2024-11-02 ENCOUNTER — Telehealth: Payer: Self-pay | Admitting: Adult Health

## 2024-11-02 VITALS — BP 131/87 | HR 87 | Temp 98.0°F | Ht 66.0 in | Wt 242.4 lb

## 2024-11-02 DIAGNOSIS — G4700 Insomnia, unspecified: Secondary | ICD-10-CM

## 2024-11-02 DIAGNOSIS — M25551 Pain in right hip: Secondary | ICD-10-CM

## 2024-11-02 MED ORDER — DICLOFENAC SODIUM 1 % EX GEL: 2.0000 g | Freq: Four times a day (QID) | CUTANEOUS | 2 refills | Status: AC

## 2024-11-02 NOTE — Assessment & Plan Note (Signed)
 Patient was seen for this earlier in January. States that she has been trying the exercises but lidocaine  patch has not helped. With tenderness to palpation over the greater trochanter and no restricted ROM with internal rotation, I still believe this greater trochanteric pain.   Plan: Voltaren  gel  Referral to physical therapy  -If after 4 sessions this pain is not getting better could consider referral to sports medicine or orthopedics. Could also consider steroid injection -Emphasized the importance of weight loss strategies with healthy diet and exercise. Pt not interested in GLP1 at this time

## 2024-11-02 NOTE — Telephone Encounter (Signed)
 Pt states her PCP has encouraged her to ask her Neurologist if she will manage her traZODone  (DESYREL ) 50 MG tablet .  Pt was told her PCP feels this is better managed by a Neurologist.

## 2024-11-02 NOTE — Telephone Encounter (Signed)
 Pcp is stating that Neurologist needs to manage Trazodone , is this something that you want to manage at this time?

## 2024-11-02 NOTE — Patient Instructions (Addendum)
 Today we discussed the following medical conditions and plan:   For your hip pain, I have sent in the voltaren  gel. Someone will call you to schedule the physical therapy. If that does not seem to help after 4 sessions, let us  know and I can refer you to the sports medicine center. Continue to work on weight loss as well  For your sleep we will try to set you up with someone who specializes in sleep habits. I would also discuss getting a sleep study with your neurologist   We look forward to seeing you next time. Please call our clinic at [phone] if you have any questions or concerns. The best time to call is Monday-Friday from 9am-4pm, but there is someone available 24/7. If you need medication refills, please notify your pharmacy one week in advance and they will send us  a request.   Thank you for trusting me with your care. Wishing you the best!   Emelly Wurtz D'Mello, DO  [facility]

## 2024-11-02 NOTE — Progress Notes (Signed)
 "  Established Patient Office Visit  Subjective   Patient ID: Teresa Logan, female    DOB: 22-Sep-1966  Age: 59 y.o. MRN: [id number]  Chief Complaint  Patient presents with   Hip Pain    Right hip pain has tried to use lidocaine  patches to relive the pain but the patches weren't working  Patient is requesting Trazodone  to be increased/requesting Mortin 800 mg   Referral    Referral for hip pain    HPI Patient is a 59 year old female with past medical history of Parkinson's disease, type 2 diabetes, obesity, hypertension that presents today for follow-up of insomnia and right hip pain. See problem based plan and assessment for details.    ROS See problem based plan and assessment    Objective:     BP 131/87 (BP Location: Left Arm, Patient Position: Sitting, Cuff Size: Large)   Pulse 87   Temp 98 F (36.7 C) (Oral)   Ht 5' 6 (1.676 m)   Wt 242 lb 6.4 oz (110 kg)   SpO2 94%   BMI 39.12 kg/m  Wt Readings from Last 3 Encounters:  11/02/24 242 lb 6.4 oz (110 kg)  10/09/24 238 lb 12.8 oz (108.3 kg)  10/09/24 239 lb (108.4 kg)      Physical Exam Constitutional:      General: She is not in acute distress.    Appearance: She is obese.  Cardiovascular:     Rate and Rhythm: Normal rate and regular rhythm.     Heart sounds: No murmur heard. Pulmonary:     Effort: Pulmonary effort is normal. No respiratory distress.     Breath sounds: Normal breath sounds.  Musculoskeletal:     Comments: Deri and fadir negative bilaterally. Rom of intact.   Tenderness over greater trochanter on the right. No erythema noted or elevated temperature   Negative slump test  Neurological:     Mental Status: She is alert.     Gait: Gait normal.     Comments: Tremor noted bilaterally       No results found for any visits on 11/02/24.    The 10-year ASCVD risk score (Arnett DK, et al., 2019) is: 13.7%    Assessment & Plan:   Problem List Items Addressed This Visit    None  Assessment & Plan Greater trochanteric pain syndrome of right lower extremity Patient was seen for this earlier in January. States that she has been trying the exercises but lidocaine  patch has not helped. With tenderness to palpation over the greater trochanter and no restricted ROM with internal rotation, I still believe this greater trochanteric pain.   Plan: Voltaren  gel  Referral to physical therapy  -If after 4 sessions this pain is not getting better could consider referral to sports medicine or orthopedics. Could also consider steroid injection -Emphasized the importance of weight loss strategies with healthy diet and exercise. Pt not interested in GLP1 at this time  Insomnia, unspecified type Patient was given 3 week course of trazodone  and stated that she increase dose to 50 mg and this helped her sleep. I did emphasize the importance of trying to use melatonin. 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 Circadian rhythm has likely not normalized. Had also stated that tremor keeps her up at night as well. I did discuss that with trazodone  she will likely continue needing higher doses and I would prefer trying other methods to normalize circadian rhythms before  prescribing sedative medication. Neurology had discussed sleep study with her which I think could be beneficial.  Did ask her to contact neurology and see if they would be able to order sleep study since originally discussed at that visit.   Plan: Continue using melatonin for sleep Will send referral to see if patient can be set up with C[facility] for sleep habits     No follow-ups on file.    Clint Biello D'Mello, DO Patient discussed with Dr. Jeanelle  "

## 2024-11-02 NOTE — Assessment & Plan Note (Signed)
 Patient was given 3 week course of trazodone  and stated that she increase dose to 50 mg and this helped her sleep. I did emphasize the importance of trying to use melatonin. 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 Circadian rhythm has likely not normalized. Had also stated that tremor keeps her up at night as well. I did discuss that with trazodone  she will likely continue needing higher doses and I would prefer trying other methods to normalize circadian rhythms before prescribing sedative medication. Neurology had discussed sleep study with her which I think could be beneficial.  Did ask her to contact neurology and see if they would be able to order sleep study since originally discussed at that visit.   Plan: Continue using melatonin for sleep Will send referral to see if patient can be set up with [facility] for sleep habits

## 2024-11-02 NOTE — Telephone Encounter (Signed)
 Will forward to Dr. Buck for input. Typically, this is a medication managed by PCP but again, will forward to Dr. Buck for input.

## 2024-11-03 NOTE — Progress Notes (Signed)
 Internal Medicine Clinic Attending  Case discussed with the resident at the time of the visit.  We reviewed the resident's history and exam and pertinent patient test results.  I agree with the assessment, diagnosis, and plan of care documented in the resident's note.

## 2024-11-09 ENCOUNTER — Ambulatory Visit

## 2024-12-27 ENCOUNTER — Ambulatory Visit: Payer: Self-pay | Admitting: Student

## 2024-12-27 ENCOUNTER — Ambulatory Visit

## 2025-04-10 ENCOUNTER — Ambulatory Visit: Admitting: Neurology
